# Patient Record
Sex: Female | Born: 1965 | Race: Black or African American | Hispanic: No | Marital: Single | Smoking: Never smoker
Health system: Southern US, Community
[De-identification: ages and names within clinical notes are randomized; demographics above are authoritative.]

## PROBLEM LIST (undated history)

## (undated) DIAGNOSIS — I10 Essential (primary) hypertension: Secondary | ICD-10-CM

## (undated) DIAGNOSIS — R87629 Unspecified abnormal cytological findings in specimens from vagina: Secondary | ICD-10-CM

## (undated) DIAGNOSIS — Z5189 Encounter for other specified aftercare: Secondary | ICD-10-CM

## (undated) DIAGNOSIS — R87612 Low grade squamous intraepithelial lesion on cytologic smear of cervix (LGSIL): Secondary | ICD-10-CM

## (undated) DIAGNOSIS — M2550 Pain in unspecified joint: Secondary | ICD-10-CM

## (undated) DIAGNOSIS — M1990 Unspecified osteoarthritis, unspecified site: Secondary | ICD-10-CM

## (undated) DIAGNOSIS — D649 Anemia, unspecified: Secondary | ICD-10-CM

## (undated) DIAGNOSIS — D219 Benign neoplasm of connective and other soft tissue, unspecified: Secondary | ICD-10-CM

## (undated) DIAGNOSIS — T7840XA Allergy, unspecified, initial encounter: Secondary | ICD-10-CM

## (undated) DIAGNOSIS — K219 Gastro-esophageal reflux disease without esophagitis: Secondary | ICD-10-CM

## (undated) DIAGNOSIS — K279 Peptic ulcer, site unspecified, unspecified as acute or chronic, without hemorrhage or perforation: Secondary | ICD-10-CM

## (undated) DIAGNOSIS — M549 Dorsalgia, unspecified: Secondary | ICD-10-CM

## (undated) HISTORY — DX: Encounter for other specified aftercare: Z51.89

## (undated) HISTORY — DX: Allergy, unspecified, initial encounter: T78.40XA

## (undated) HISTORY — DX: Pain in unspecified joint: M25.50

## (undated) HISTORY — DX: Essential (primary) hypertension: I10

## (undated) HISTORY — DX: Anemia, unspecified: D64.9

## (undated) HISTORY — DX: Peptic ulcer, site unspecified, unspecified as acute or chronic, without hemorrhage or perforation: K27.9

## (undated) HISTORY — DX: Low grade squamous intraepithelial lesion on cytologic smear of cervix (LGSIL): R87.612

## (undated) HISTORY — DX: Dorsalgia, unspecified: M54.9

## (undated) HISTORY — DX: Unspecified osteoarthritis, unspecified site: M19.90

## (undated) HISTORY — DX: Unspecified abnormal cytological findings in specimens from vagina: R87.629

## (undated) HISTORY — DX: Benign neoplasm of connective and other soft tissue, unspecified: D21.9

## (undated) HISTORY — PX: DILATION AND CURETTAGE OF UTERUS: SHX78

---

## 2013-12-12 ENCOUNTER — Encounter (HOSPITAL_COMMUNITY): Payer: Self-pay | Admitting: Emergency Medicine

## 2013-12-12 ENCOUNTER — Emergency Department (HOSPITAL_COMMUNITY): Payer: Worker's Compensation

## 2013-12-12 ENCOUNTER — Emergency Department (HOSPITAL_COMMUNITY)
Admission: EM | Admit: 2013-12-12 | Discharge: 2013-12-12 | Disposition: A | Discharge status: Home / Self Care | Payer: Worker's Compensation | Attending: Emergency Medicine | Admitting: Emergency Medicine

## 2013-12-12 DIAGNOSIS — S99919A Unspecified injury of unspecified ankle, initial encounter: Principal | ICD-10-CM

## 2013-12-12 DIAGNOSIS — S8990XA Unspecified injury of unspecified lower leg, initial encounter: Secondary | ICD-10-CM | POA: Diagnosis present

## 2013-12-12 DIAGNOSIS — Y9389 Activity, other specified: Secondary | ICD-10-CM | POA: Diagnosis not present

## 2013-12-12 DIAGNOSIS — S99929A Unspecified injury of unspecified foot, initial encounter: Principal | ICD-10-CM

## 2013-12-12 DIAGNOSIS — Y92410 Unspecified street and highway as the place of occurrence of the external cause: Secondary | ICD-10-CM | POA: Insufficient documentation

## 2013-12-12 MED ORDER — OXYCODONE-ACETAMINOPHEN 5-325 MG PO TABS
2.0000 | ORAL_TABLET | Freq: Once | ORAL | Status: AC
  Administered 2013-12-12: 2 via ORAL
  Filled 2013-12-12: qty 2

## 2013-12-12 MED ORDER — OXYCODONE-ACETAMINOPHEN 5-325 MG PO TABS: 1.0000 | ORAL_TABLET | ORAL | Status: DC | PRN
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
## 2013-12-12 NOTE — Progress Notes (Signed)
Orthopedic Tech Progress Note Patient Details:  Anita Holland [birth year] [id number]  Ortho Devices Type of Ortho Device: ASO;Knee Immobilizer Ortho Device/Splint Location: applied a knee immobilizer and lace up ankle brace to the right lower extremity. the patient is instructed on the wear and care of the braces. she will contact the nursing staff with any further questions or concerns. Ortho Device/Splint Interventions: Application   Ashok Cordia 12/12/2013, 2:43 AM

## 2013-12-12 NOTE — Discharge Instructions (Signed)
Take the prescribed medication as directed. Continue wearing brace and using crutches as needed. Ice and elevate foot at home to help with pain and swelling. Follow-up with Dr. Sharol Given if no improvement of symptoms within 1 week or if symptoms worsen. Return to the ED for new or worsening symptoms.

## 2013-12-12 NOTE — ED Provider Notes (Signed)
Medical screening examination/treatment/procedure(s) were performed by non-physician practitioner and as supervising physician I was immediately available for consultation/collaboration.   EKG Interpretation None        Wandra Arthurs, MD 12/12/13 [phone]
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
## 2014-05-11 ENCOUNTER — Emergency Department (HOSPITAL_COMMUNITY)
Admission: EM | Admit: 2014-05-11 | Discharge: 2014-05-11 | Disposition: A | Discharge status: Home / Self Care | Payer: No Typology Code available for payment source | Attending: Emergency Medicine | Admitting: Emergency Medicine

## 2014-05-11 ENCOUNTER — Encounter (HOSPITAL_COMMUNITY): Payer: Self-pay | Admitting: *Deleted

## 2014-05-11 DIAGNOSIS — K088 Other specified disorders of teeth and supporting structures: Secondary | ICD-10-CM | POA: Insufficient documentation

## 2014-05-11 DIAGNOSIS — K0889 Other specified disorders of teeth and supporting structures: Secondary | ICD-10-CM

## 2014-05-11 DIAGNOSIS — R05 Cough: Secondary | ICD-10-CM | POA: Insufficient documentation

## 2014-05-11 DIAGNOSIS — K007 Teething syndrome: Secondary | ICD-10-CM | POA: Insufficient documentation

## 2014-05-11 DIAGNOSIS — Z791 Long term (current) use of non-steroidal anti-inflammatories (NSAID): Secondary | ICD-10-CM | POA: Insufficient documentation

## 2014-05-11 DIAGNOSIS — G479 Sleep disorder, unspecified: Secondary | ICD-10-CM | POA: Insufficient documentation

## 2014-05-11 DIAGNOSIS — K219 Gastro-esophageal reflux disease without esophagitis: Secondary | ICD-10-CM | POA: Insufficient documentation

## 2014-05-11 HISTORY — DX: Gastro-esophageal reflux disease without esophagitis: K21.9

## 2014-05-11 MED ORDER — PENICILLIN V POTASSIUM 250 MG PO TABS: 250.0000 mg | ORAL_TABLET | Freq: Four times a day (QID) | ORAL | Status: AC

## 2014-05-11 MED ORDER — SUCRALFATE 1 GM/10ML PO SUSP: 1.0000 g | Freq: Three times a day (TID) | ORAL | Status: DC

## 2014-05-11 MED ORDER — OXYCODONE-ACETAMINOPHEN 5-325 MG PO TABS: 1.0000 | ORAL_TABLET | ORAL | Status: DC | PRN

## 2014-05-11 NOTE — ED Provider Notes (Signed)
CSN: [id number]     Arrival date & time 05/11/14  1311 History   First MD Initiated Contact with Patient 05/11/14 1630     Chief Complaint  Patient presents with  . Dental Pain  . Emesis     (Consider location/radiation/quality/duration/timing/severity/associated sxs/prior Treatment) HPI Comments: Patient with PMH of GERD presents to the ED with a chief complaint of multiple complaints.  1. Dental pain: she states that she has had dental pain for the past week or so. She denies a new injury to her tooth. She states that she has had dental infections in the past. She denies any fevers, or chills. She has tried using nothing for her symptoms. She states the pain is moderate to severe. She does not have a dentist.  2. GERD: Patient states that she has a history of GERD. She states that she has had some nausea, and intermittent vomiting. She states that she feels like acid is bubbling in her stomach. She has felt these symptoms before. She denies abdominal pain. She has not taken anything to alleviate the symptoms.  3. Cough: Patient states that she has had a cough 1 month. She denies any fevers or chills. She has tried OTC cough and cold medicine. Aggravating or alleviating factors. Denies any shortness of breath or chest pain. Denies any productive sputum.  The history is provided by the patient. No language interpreter was used.    Past Medical History  Diagnosis Date  . GERD (gastroesophageal reflux disease)    History reviewed. No pertinent past surgical history. History reviewed. No pertinent family history. History  Substance Use Topics  . Smoking status: Never Smoker   . Smokeless tobacco: Not on file  . Alcohol Use: No   OB History    No data available     Review of Systems  Constitutional: Negative for fever and chills.  HENT: Positive for dental problem. Negative for drooling.   Respiratory: Positive for cough. Negative for shortness of breath.   Cardiovascular:  Negative for chest pain.  Gastrointestinal: Positive for nausea. Negative for vomiting, diarrhea and constipation.  Genitourinary: Negative for dysuria.  Neurological: Negative for speech difficulty.  Psychiatric/Behavioral: Positive for sleep disturbance.  All other systems reviewed and are negative.     Allergies  Review of patient's allergies indicates no known allergies.  Home Medications   Prior to Admission medications   Medication Sig Start Date End Date Taking? Authorizing Provider  diclofenac (VOLTAREN) 75 MG EC tablet Take 75 mg by mouth 2 (two) times daily. 05/05/14  Yes Historical Provider, MD  ibuprofen (ADVIL,MOTRIN) 200 MG tablet Take 200 mg by mouth every 6 (six) hours as needed for mild pain (toothache).   Yes Historical Provider, MD  oxyCODONE-acetaminophen (PERCOCET/ROXICET) 5-325 MG per tablet Take 1 tablet by mouth every 4 (four) hours as needed. 05/11/14   Montine Circle, PA-C  penicillin v potassium (VEETID) 250 MG tablet Take 1 tablet (250 mg total) by mouth 4 (four) times daily. 05/11/14 05/18/14  Montine Circle, PA-C  sucralfate (CARAFATE) 1 GM/10ML suspension Take 10 mLs (1 g total) by mouth 4 (four) times daily -  with meals and at bedtime. 05/11/14   Montine Circle, PA-C   BP 114/95 mmHg  Pulse 98  Temp(Src) 98.5 F (36.9 C) (Oral)  Resp 20  Ht 5\' 3"  (1.6 m)  Wt 167 lb (75.751 kg)  BMI 29.59 kg/m2  SpO2 100%  LMP 04/07/2014 Physical Exam  Constitutional: She is oriented to person, place, and  time. She appears well-developed and well-nourished.  HENT:  Head: Normocephalic and atraumatic.  Mouth/Throat:    Poor dentition throughout.  Affected tooth as diagrammed.  No signs of peritonsillar or tonsillar abscess.  No signs of gingival abscess. Oropharynx is clear and without exudates.  Uvula is midline.  Airway is intact. No signs of Ludwig's angina with palpation of oral and sublingual mucosa.   Eyes: Conjunctivae and EOM are normal. Pupils are  equal, round, and reactive to light.  Neck: Normal range of motion. Neck supple.  Cardiovascular: Normal rate and regular rhythm.  Exam reveals no gallop and no friction rub.   No murmur heard. Pulmonary/Chest: Effort normal and breath sounds normal. No respiratory distress. She has no wheezes. She has no rales. She exhibits no tenderness.  Clear to auscultation bilaterally, no wheezes or rales  Abdominal: Soft. Bowel sounds are normal. She exhibits no distension and no mass. There is no tenderness. There is no rebound and no guarding.  No focal abdominal tenderness, no RLQ tenderness or pain at McBurney's point, no RUQ tenderness or Murphy's sign, no left-sided abdominal tenderness, no fluid wave, or signs of peritonitis   Musculoskeletal: Normal range of motion. She exhibits no edema or tenderness.  Neurological: She is alert and oriented to person, place, and time.  Skin: Skin is warm and dry.  Psychiatric: She has a normal mood and affect. Her behavior is normal. Judgment and thought content normal.  Nursing note and vitals reviewed.   ED Course  Procedures (including critical care time) Labs Review Labs Reviewed - No data to display  Imaging Review No results found.   EKG Interpretation None      MDM   Final diagnoses:  Pain, dental    Patient with dental pain, GERD, and cough. Will treat dental pain with antibiotics and pain medicine. No abscess. No airway obstruction. Will recommend OTC cough and cold medicine for the cough. Will treat his GERD with Carafate. Recommend primary care follow-up for cough and GERD. Lungs are clear, patient is afebrile, no productive cough. Doubt pneumonia. No shortness of breath or chest pain.    Montine Circle, PA-C 05/11/14 1709  Mariea Clonts, MD 05/12/14 [phone]

## 2014-05-11 NOTE — ED Notes (Signed)
PA at the bedside.

## 2014-05-11 NOTE — ED Notes (Signed)
Pt in c/o toothache and vomiting since Wednesday, states she has had trouble with the tooth in the past, and symptoms are getting worse

## 2014-05-11 NOTE — Discharge Instructions (Signed)
Dental Pain A tooth ache may be caused by cavities (tooth decay). Cavities expose the nerve of the tooth to air and hot or cold temperatures. It may come from an infection or abscess (also called a boil or furuncle) around your tooth. It is also often caused by dental caries (tooth decay). This causes the pain you are having. DIAGNOSIS  Your caregiver can diagnose this problem by exam. TREATMENT   If caused by an infection, it may be treated with medications which kill germs (antibiotics) and pain medications as prescribed by your caregiver. Take medications as directed.  Only take over-the-counter or prescription medicines for pain, discomfort, or fever as directed by your caregiver.  Whether the tooth ache today is caused by infection or dental disease, you should see your dentist as soon as possible for further care. SEEK MEDICAL CARE IF: The exam and treatment you received today has been provided on an emergency basis only. This is not a substitute for complete medical or dental care. If your problem worsens or new problems (symptoms) appear, and you are unable to meet with your dentist, call or return to this location. SEEK IMMEDIATE MEDICAL CARE IF:   You have a fever.  You develop redness and swelling of your face, jaw, or neck.  You are unable to open your mouth.  You have severe pain uncontrolled by pain medicine. MAKE SURE YOU:   Understand these instructions.  Will watch your condition.  Will get help right away if you are not doing well or get worse. Document Released: 06/12/2005 Document Revised: 09/04/2011 Document Reviewed: 01/29/2008 [facility] Patient Information 2015 [place], [place]. This information is not intended to replace advice given to you by your health care provider. Make sure you discuss any questions you have with your health care provider. Gastroesophageal Reflux Disease, Adult Gastroesophageal reflux disease (GERD) happens when acid from your stomach flows  up into the esophagus. When acid comes in contact with the esophagus, the acid causes soreness (inflammation) in the esophagus. Over time, GERD may create small holes (ulcers) in the lining of the esophagus. CAUSES   Increased body weight. This puts pressure on the stomach, making acid rise from the stomach into the esophagus.  Smoking. This increases acid production in the stomach.  Drinking alcohol. This causes decreased pressure in the lower esophageal sphincter (valve or ring of muscle between the esophagus and stomach), allowing acid from the stomach into the esophagus.  Late evening meals and a full stomach. This increases pressure and acid production in the stomach.  A malformed lower esophageal sphincter. Sometimes, no cause is found. SYMPTOMS   Burning pain in the lower part of the mid-chest behind the breastbone and in the mid-stomach area. This may occur twice a week or more often.  Trouble swallowing.  Sore throat.  Dry cough.  Asthma-like symptoms including chest tightness, shortness of breath, or wheezing. DIAGNOSIS  Your caregiver may be able to diagnose GERD based on your symptoms. In some cases, X-rays and other tests may be done to check for complications or to check the condition of your stomach and esophagus. TREATMENT  Your caregiver may recommend over-the-counter or prescription medicines to help decrease acid production. Ask your caregiver before starting or adding any new medicines.  HOME CARE INSTRUCTIONS   Change the factors that you can control. Ask your caregiver for guidance concerning weight loss, quitting smoking, and alcohol consumption.  Avoid foods and drinks that make your symptoms worse, such as:  Caffeine or alcoholic  drinks.  Chocolate.  Peppermint or mint flavorings.  Garlic and onions.  Spicy foods.  Citrus fruits, such as oranges, lemons, or limes.  Tomato-based foods such as sauce, chili, salsa, and pizza.  Fried and fatty  foods.  Avoid lying down for the 3 hours prior to your bedtime or prior to taking a nap.  Eat small, frequent meals instead of large meals.  Wear loose-fitting clothing. Do not wear anything tight around your waist that causes pressure on your stomach.  Raise the head of your bed 6 to 8 inches with wood blocks to help you sleep. Extra pillows will not help.  Only take over-the-counter or prescription medicines for pain, discomfort, or fever as directed by your caregiver.  Do not take aspirin, ibuprofen, or other nonsteroidal anti-inflammatory drugs (NSAIDs). SEEK IMMEDIATE MEDICAL CARE IF:   You have pain in your arms, neck, jaw, teeth, or back.  Your pain increases or changes in intensity or duration.  You develop nausea, vomiting, or sweating (diaphoresis).  You develop shortness of breath, or you faint.  Your vomit is green, yellow, black, or looks like coffee grounds or blood.  Your stool is red, bloody, or black. These symptoms could be signs of other problems, such as heart disease, gastric bleeding, or esophageal bleeding. MAKE SURE YOU:   Understand these instructions.  Will watch your condition.  Will get help right away if you are not doing well or get worse. Document Released: 03/22/2005 Document Revised: 09/04/2011 Document Reviewed: 12/30/2010 [facility] Patient Information 2015 [place], [place]. This information is not intended to replace advice given to you by your health care provider. Make sure you discuss any questions you have with your health care provider.

## 2016-03-27 ENCOUNTER — Encounter: Payer: Self-pay | Admitting: Family Medicine

## 2016-03-27 ENCOUNTER — Ambulatory Visit (INDEPENDENT_AMBULATORY_CARE_PROVIDER_SITE_OTHER): Payer: BLUE CROSS/BLUE SHIELD | Admitting: Family Medicine

## 2016-03-27 VITALS — BP 124/70 | HR 80 | Ht 62.75 in | Wt 175.8 lb

## 2016-03-27 DIAGNOSIS — Z7689 Persons encountering health services in other specified circumstances: Secondary | ICD-10-CM

## 2016-03-27 DIAGNOSIS — R5383 Other fatigue: Secondary | ICD-10-CM

## 2016-03-27 DIAGNOSIS — Z309 Encounter for contraceptive management, unspecified: Secondary | ICD-10-CM

## 2016-03-27 DIAGNOSIS — R079 Chest pain, unspecified: Secondary | ICD-10-CM

## 2016-03-27 DIAGNOSIS — K219 Gastro-esophageal reflux disease without esophagitis: Secondary | ICD-10-CM

## 2016-03-27 DIAGNOSIS — E669 Obesity, unspecified: Secondary | ICD-10-CM | POA: Diagnosis not present

## 2016-03-27 LAB — COMPLETE METABOLIC PANEL WITH GFR
ALBUMIN: 4.1 g/dL (ref 3.6–5.1)
ALK PHOS: 36 U/L (ref 33–115)
ALT: 12 U/L (ref 6–29)
AST: 15 U/L (ref 10–35)
BUN: 13 mg/dL (ref 7–25)
CO2: 25 mmol/L (ref 20–31)
Calcium: 9.4 mg/dL (ref 8.6–10.2)
Chloride: 103 mmol/L (ref 98–110)
Creat: 0.59 mg/dL (ref 0.50–1.10)
GFR, Est African American: >89 mL/min (ref >=60)
GLUCOSE: 87 mg/dL (ref 65–99)
Potassium: 4 mmol/L (ref 3.5–5.3)
SODIUM: 140 mmol/L (ref 135–146)
Total Bilirubin: 0.4 mg/dL (ref 0.2–1.2)
Total Protein: 6.4 g/dL (ref 6.1–8.1)

## 2016-03-27 LAB — CBC WITH DIFFERENTIAL/PLATELET
BASOS ABS: 64 {cells}/uL (ref 0–200)
Basophils Relative: 1 %
EOS ABS: 448 {cells}/uL (ref 15–500)
EOS PCT: 7 %
HCT: 40.5 % (ref 35.0–45.0)
HEMOGLOBIN: 13.1 g/dL (ref 11.7–15.5)
LYMPHS ABS: 1984 {cells}/uL (ref 850–3900)
Lymphocytes Relative: 31 %
MCH: 26.6 pg — AB (ref 27.0–33.0)
MCHC: 32.3 g/dL (ref 32.0–36.0)
MCV: 82.2 fL (ref 80.0–100.0)
MONO ABS: 576 {cells}/uL (ref 200–950)
MPV: 8.3 fL (ref 7.5–12.5)
Monocytes Relative: 9 %
NEUTROS ABS: 3328 {cells}/uL (ref 1500–7800)
Neutrophils Relative %: 52 %
Platelets: 316 10*3/uL (ref 140–400)
RBC: 4.93 MIL/uL (ref 3.80–5.10)
RDW: 16.5 % — ABNORMAL HIGH (ref 11.0–15.0)
WBC: 6.4 10*3/uL (ref 4.0–10.5)

## 2016-03-27 LAB — TSH: TSH: 1.2 mIU/L
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
## 2016-03-27 NOTE — Patient Instructions (Signed)
I would like to get your medical records from your previous providers. Try using a free app called My [employer]itness Pal and track your calories.  We will call you with lab results.  I recommend that you return in the next few weeks for a physical and fasting labs.  We will further discuss contraception at that time.

## 2016-04-04 ENCOUNTER — Telehealth: Payer: Self-pay

## 2016-04-04 NOTE — Telephone Encounter (Signed)
Records placed in your folder for review. /RLB  

## 2016-05-01 ENCOUNTER — Encounter: Payer: Self-pay | Admitting: Family Medicine

## 2016-05-01 ENCOUNTER — Ambulatory Visit (INDEPENDENT_AMBULATORY_CARE_PROVIDER_SITE_OTHER): Payer: BLUE CROSS/BLUE SHIELD | Admitting: Family Medicine

## 2016-05-01 VITALS — BP 110/70 | HR 79 | Ht 62.75 in | Wt 176.8 lb

## 2016-05-01 DIAGNOSIS — E781 Pure hyperglyceridemia: Secondary | ICD-10-CM | POA: Diagnosis not present

## 2016-05-01 DIAGNOSIS — Z1239 Encounter for other screening for malignant neoplasm of breast: Secondary | ICD-10-CM

## 2016-05-01 DIAGNOSIS — Z0000 Encounter for general adult medical examination without abnormal findings: Secondary | ICD-10-CM

## 2016-05-01 DIAGNOSIS — Z1231 Encounter for screening mammogram for malignant neoplasm of breast: Secondary | ICD-10-CM

## 2016-05-01 DIAGNOSIS — Z1211 Encounter for screening for malignant neoplasm of colon: Secondary | ICD-10-CM

## 2016-05-01 LAB — POCT URINALYSIS DIPSTICK
Bilirubin, UA: NEGATIVE
Glucose, UA: NEGATIVE
Ketones, UA: NEGATIVE
Nitrite, UA: POSITIVE
PH UA: 6
Urobilinogen, UA: NEGATIVE

## 2016-05-01 NOTE — Progress Notes (Signed)
Subjective:    Patient ID: Anita Holland, female    DOB: [birth year], 50 y.o.   MRN: [id number]  HPI Chief Complaint  Patient presents with  . cpe    fasting cpe, no other concerns, declines flu shot and tdap   She is new to the practice and here for a CPE.  Previous medical care :  [facility].  Last CPE: [facility] in [place].   Other providers: none  PMH: history of ? Stomach ulcers, GERD- takes Tums occasionally. Ranitidine in past.   Surgeries: none  Mammogram: scheduled for October 31 in [place]. This was scheduled through her insurance. She would like to cancel and have this done in [place].   LMP: today. Reports she still has regular menstrual cycles.  States she has a new sexual partner and is using condoms but would also like to be put on OCPs. Denies history of smoking, migraines, HTN, blood clots.   Does not currently know how many calories she is eating but states she is eating more than 3 meals per day.  States she is not exercising.  Denies smoking, drinking alcohol, drug use.   Lives alone. Works as a Animal nutritionist.  Has 8 children.   Immunizations: refuses Tdap and flu shot.   Health maintenance:  Mammogram: 2014 Colonoscopy: never Last Gynecological Exam:  Pap smear 2015 and normal.  Last Menstrual cycle: started today.  Last Dental Exam: last month. Twice annually.  Last Eye Exam: last Monday. Getting prescription glasses.   Wears seatbelt always, uses sunscreen, smoke detectors in home and functioning, does not text while driving and feels safe in home environment.   Reviewed allergies, medications, past medical, surgical, family, and social history.     Review of Systems Review of Systems Constitutional: -fever, -chills, -sweats, -unexpected weight change,-fatigue ENT: -runny nose, -ear pain, -sore throat Cardiology:  -chest pain, -palpitations, -edema Respiratory: -cough, -shortness of breath, -wheezing Gastroenterology:  -abdominal pain, -nausea, -vomiting, -diarrhea, -constipation  Hematology: -bleeding or bruising problems Musculoskeletal: -arthralgias, -myalgias, -joint swelling, -back pain Ophthalmology: -vision changes Urology: -dysuria, -difficulty urinating, -hematuria, -urinary frequency, -urgency Neurology: -headache, -weakness, -tingling, -numbness       Objective:   Physical Exam BP 110/70   Pulse 79   Ht 5' 2.75" (1.594 m)   Wt 176 lb 12.8 oz (80.2 kg)   LMP 05/01/2016   BMI 31.57 kg/m   General Appearance:    Alert, cooperative, no distress, appears stated age  Head:    Normocephalic, without obvious abnormality, atraumatic  Eyes:    PERRL, conjunctiva/corneas clear, EOM's intact, fundi    benign  Ears:    Normal TM's and external ear canals  Nose:   Nares normal, mucosa normal, no drainage or sinus   tenderness  Throat:   Lips, mucosa, and tongue normal; teeth and gums normal  Neck:   Supple, no lymphadenopathy;  thyroid:  no   enlargement/tenderness/nodules; no carotid   bruit or JVD  Back:    Spine nontender, no curvature, ROM normal, no CVA     tenderness  Lungs:     Clear to auscultation bilaterally without wheezes, rales or     ronchi; respirations unlabored  Chest Wall:    No tenderness or deformity   Heart:    Regular rate and rhythm, S1 and S2 normal, no murmur, rub   or gallop  Breast Exam:    Sending her for mammogram. Declined exam.   No axillary lymphadenopathy  Abdomen:  Soft, non-tender, nondistended, normoactive bowel sounds,    no masses, no hepatosplenomegaly  Genitalia:    Not done, patient on menstrual cycle and declined. Pap smear up to date.   Rectal:    Refused. Stool cards sent home.   Extremities:   No clubbing, cyanosis or edema  Pulses:   2+ and symmetric all extremities  Skin:   Skin color, texture, turgor normal, no rashes or lesions  Lymph nodes:   Cervical, supraclavicular, and axillary nodes normal  Neurologic:   CNII-XII intact, normal  strength, sensation and gait; reflexes 2+ and symmetric throughout          Psych:   Normal mood, affect, hygiene and grooming.    Urinalysis dipstick: leuk 1+, trace nit, trac pro, blood 3+ (menses). Asymptomatic.       Assessment & Plan:  Routine general medical examination at a health care facility - Plan: Urinalysis Dipstick, POCT occult blood stool  Screening for breast cancer - Plan: MM DIGITAL SCREENING BILATERAL  Special screening for malignant neoplasms, colon - Plan: POCT occult blood stool  Hypertriglyceridemia - Plan: Lipid panel  Discussed that overall she appears to be doing well. Records received from previous PCP in [place] and will be scanned into the computer. Advised lifestyle modifications for reflux management. Discussed healthy diet and exercise for weight loss. Mammogram ordered. She questions whether she should have a colonoscopy this year. She denies family history of polyps or colon cancer. Discussed that she appears to be low risk for colon cancer. Will send her home with stool cards to return at her convenience. History of hypertriglyceridemia. She is not fasting today. Order is in the computer and she will return for this. Otherwise recent blood work done and all normal. Flu shot refused Discussed safety and health promotion.  Pap smear will be due next year. She will call and check with her insurance company regarding co-pay for Tdap. She will only agree to this if it is covered. Follow-up pending labs or in 1 year.

## 2016-05-01 NOTE — Patient Instructions (Addendum)
Call and schedule your mammogram appointment. Return in the stool cards at your convenience. Call and check with your insurance company to see if the Tdap is covered. If it is, return for a lab visit to get this.   Return for a lab visit to have your fasting cholesterol checked.   Preventative Care for Adults - Female      MAINTAIN REGULAR HEALTH EXAMS:  A routine yearly physical is a good way to check in with your primary care provider about your health and preventive screening. It is also an opportunity to share updates about your health and any concerns you have, and receive a thorough all-over exam.   Most health insurance companies pay for at least some preventative services.  Check with your health plan for specific coverages.  WHAT PREVENTATIVE SERVICES DO WOMEN NEED?  Adult women should have their weight and blood pressure checked regularly.   Women age 35 and older should have their cholesterol levels checked regularly.  Women should be screened for cervical cancer with a Pap smear and pelvic exam beginning at either age 32, or 3 years after they become sexually activity.    Breast cancer screening generally begins at age 64 with a mammogram and breast exam by your primary care provider.    Beginning at age 39 and continuing to age 38, women should be screened for colorectal cancer.  Certain people may need continued testing until age 36.  Updating vaccinations is part of preventative care.  Vaccinations help protect against diseases such as the flu.  Osteoporosis is a disease in which the bones lose minerals and strength as we age. Women ages 57 and over should discuss this with their caregivers, as should women after menopause who have other risk factors.  Lab tests are generally done as part of preventative care to screen for anemia and blood disorders, to screen for problems with the kidneys and liver, to screen for bladder problems, to check blood sugar, and to check your  cholesterol level.  Preventative services generally include counseling about diet, exercise, avoiding tobacco, drugs, excessive alcohol consumption, and sexually transmitted infections.    GENERAL RECOMMENDATIONS FOR GOOD HEALTH:  Healthy diet:  Eat a variety of foods, including fruit, vegetables, animal or vegetable protein, such as meat, fish, chicken, and eggs, or beans, lentils, tofu, and grains, such as rice.  Drink plenty of water daily.  Decrease saturated fat in the diet, avoid lots of red meat, processed foods, sweets, fast foods, and fried foods.  Exercise:  Aerobic exercise helps maintain good heart health. At least 30-40 minutes of moderate-intensity exercise is recommended. For example, a brisk walk that increases your heart rate and breathing. This should be done on most days of the week.   Find a type of exercise or a variety of exercises that you enjoy so that it becomes a part of your daily life.  Examples are running, walking, swimming, water aerobics, and biking.  For motivation and support, explore group exercise such as aerobic class, spin class, Zumba, Yoga,or  martial arts, etc.    Set exercise goals for yourself, such as a certain weight goal, walk or run in a race such as a 5k walk/run.  Speak to your primary care provider about exercise goals.  Disease prevention:  If you smoke or chew tobacco, find out from your caregiver how to quit. It can literally save your life, no matter how long you have been a tobacco user. If you do not  use tobacco, never begin.   Maintain a healthy diet and normal weight. Increased weight leads to problems with blood pressure and diabetes.   The Body Mass Index or BMI is a way of measuring how much of your body is fat. Having a BMI above 27 increases the risk of heart disease, diabetes, hypertension, stroke and other problems related to obesity. Your caregiver can help determine your BMI and based on it develop an exercise and dietary  program to help you achieve or maintain this important measurement at a healthful level.  High blood pressure causes heart and blood vessel problems.  Persistent high blood pressure should be treated with medicine if weight loss and exercise do not work.   Fat and cholesterol leaves deposits in your arteries that can block them. This causes heart disease and vessel disease elsewhere in your body.  If your cholesterol is found to be high, or if you have heart disease or certain other medical conditions, then you may need to have your cholesterol monitored frequently and be treated with medication.   Ask if you should have a cardiac stress test if your history suggests this. A stress test is a test done on a treadmill that looks for heart disease. This test can find disease prior to there being a problem.  Menopause can be associated with physical symptoms and risks. Hormone replacement therapy is available to decrease these. You should talk to your caregiver about whether starting or continuing to take hormones is right for you.   Osteoporosis is a disease in which the bones lose minerals and strength as we age. This can result in serious bone fractures. Risk of osteoporosis can be identified using a bone density scan. Women ages 39 and over should discuss this with their caregivers, as should women after menopause who have other risk factors. Ask your caregiver whether you should be taking a calcium supplement and Vitamin D, to reduce the rate of osteoporosis.   Avoid drinking alcohol in excess (more than two drinks per day).  Avoid use of street drugs. Do not share needles with anyone. Ask for professional help if you need assistance or instructions on stopping the use of alcohol, cigarettes, and/or drugs.  Brush your teeth twice a day with fluoride toothpaste, and floss once a day. Good oral hygiene prevents tooth decay and gum disease. The problems can be painful, unattractive, and can cause other  health problems. Visit your dentist for a routine oral and dental check up and preventive care every 6-12 months.   Look at your skin regularly.  Use a mirror to look at your back. Notify your caregivers of changes in moles, especially if there are changes in shapes, colors, a size larger than a pencil eraser, an irregular border, or development of new moles.  Safety:  Use seatbelts 100% of the time, whether driving or as a passenger.  Use safety devices such as hearing protection if you work in environments with loud noise or significant background noise.  Use safety glasses when doing any work that could send debris in to the eyes.  Use a helmet if you ride a bike or motorcycle.  Use appropriate safety gear for contact sports.  Talk to your caregiver about gun safety.  Use sunscreen with a SPF (or skin protection factor) of 15 or greater.  Lighter skinned people are at a greater risk of skin cancer. Don't forget to also wear sunglasses in order to protect your eyes from too much damaging  sunlight. Damaging sunlight can accelerate cataract formation.   Practice safe sex. Use condoms. Condoms are used for birth control and to help reduce the spread of sexually transmitted infections (or STIs).  Some of the STIs are gonorrhea (the clap), chlamydia, syphilis, trichomonas, herpes, HPV (human papilloma virus) and HIV (human immunodeficiency virus) which causes AIDS. The herpes, HIV and HPV are viral illnesses that have no cure. These can result in disability, cancer and death.   Keep carbon monoxide and smoke detectors in your home functioning at all times. Change the batteries every 6 months or use a model that plugs into the wall.   Vaccinations:  Stay up to date with your tetanus shots and other required immunizations. You should have a booster for tetanus every 10 years. Be sure to get your flu shot every year, since 5%-20% of the [place] population comes down with the flu. The flu vaccine changes each  year, so being vaccinated once is not enough. Get your shot in the fall, before the flu season peaks.   Other vaccines to consider:  Human Papilloma Virus or HPV causes cancer of the cervix, and other infections that can be transmitted from person to person. There is a vaccine for HPV, and females should get immunized between the ages of 48 and 97. It requires a series of 3 shots.   Pneumococcal vaccine to protect against certain types of pneumonia.  This is normally recommended for adults age 12 or older.  However, adults younger than 49 years old with certain underlying conditions such as diabetes, heart or lung disease should also receive the vaccine.  Shingles vaccine to protect against Varicella Zoster if you are older than age 87, or younger than 49 years old with certain underlying illness.  Hepatitis A vaccine to protect against a form of infection of the liver by a virus acquired from food.  Hepatitis B vaccine to protect against a form of infection of the liver by a virus acquired from blood or body fluids, particularly if you work in health care.  If you plan to travel internationally, check with your local health department for specific vaccination recommendations.  Cancer Screening:  Breast cancer screening is essential to preventive care for women. All women age 37 and older should perform a breast self-exam every month. At age 59 and older, women should have their caregiver complete a breast exam each year. Women at ages 33 and older should have a mammogram (x-ray film) of the breasts. Your caregiver can discuss how often you need mammograms.    Cervical cancer screening includes taking a Pap smear (sample of cells examined under a microscope) from the cervix (end of the uterus). It also includes testing for HPV (Human Papilloma Virus, which can cause cervical cancer). Screening and a pelvic exam should begin at age 90, or 3 years after a woman becomes sexually active. Screening  should occur every year, with a Pap smear but no HPV testing, up to age 47. After age 20, you should have a Pap smear every 3 years with HPV testing, if no HPV was found previously.   Most routine colon cancer screening begins at the age of 25. On a yearly basis, doctors may provide special easy to use take-home tests to check for hidden blood in the stool. Sigmoidoscopy or colonoscopy can detect the earliest forms of colon cancer and is life saving. These tests use a small camera at the end of a tube to directly examine the colon. Speak  to your caregiver about this at age 10, when routine screening begins (and is repeated every 5 years unless early forms of pre-cancerous polyps or small growths are found).

## 2016-05-08 ENCOUNTER — Other Ambulatory Visit: Payer: BLUE CROSS/BLUE SHIELD

## 2016-05-08 DIAGNOSIS — E781 Pure hyperglyceridemia: Secondary | ICD-10-CM

## 2016-05-08 LAB — LIPID PANEL
CHOL/HDL RATIO: 3.7 ratio (ref <5.0)
CHOLESTEROL: 165 mg/dL (ref <200)
HDL: 45 mg/dL — ABNORMAL LOW (ref >50)
LDL Cholesterol: 99 mg/dL (ref <100)
Triglycerides: 103 mg/dL (ref <150)
VLDL: 21 mg/dL (ref <30)

## 2016-06-05 ENCOUNTER — Ambulatory Visit
Admission: RE | Admit: 2016-06-05 | Discharge: 2016-06-05 | Disposition: A | Discharge status: Home / Self Care | Payer: BLUE CROSS/BLUE SHIELD | Source: Ambulatory Visit | Attending: Family Medicine | Admitting: Family Medicine

## 2016-06-05 DIAGNOSIS — Z1239 Encounter for other screening for malignant neoplasm of breast: Secondary | ICD-10-CM

## 2017-02-21 ENCOUNTER — Ambulatory Visit
Admission: RE | Admit: 2017-02-21 | Discharge: 2017-02-21 | Disposition: A | Discharge status: Home / Self Care | Payer: BLUE CROSS/BLUE SHIELD | Source: Ambulatory Visit | Attending: Family Medicine | Admitting: Family Medicine

## 2017-02-21 ENCOUNTER — Ambulatory Visit (INDEPENDENT_AMBULATORY_CARE_PROVIDER_SITE_OTHER): Payer: BLUE CROSS/BLUE SHIELD | Admitting: Family Medicine

## 2017-02-21 ENCOUNTER — Encounter: Payer: Self-pay | Admitting: Family Medicine

## 2017-02-21 VITALS — BP 120/90 | HR 93 | Temp 98.2°F | Resp 16 | Wt 178.8 lb

## 2017-02-21 DIAGNOSIS — R29898 Other symptoms and signs involving the musculoskeletal system: Secondary | ICD-10-CM

## 2017-02-21 DIAGNOSIS — R200 Anesthesia of skin: Secondary | ICD-10-CM | POA: Diagnosis not present

## 2017-02-21 DIAGNOSIS — R079 Chest pain, unspecified: Secondary | ICD-10-CM

## 2017-02-21 DIAGNOSIS — Z8249 Family history of ischemic heart disease and other diseases of the circulatory system: Secondary | ICD-10-CM

## 2017-02-21 DIAGNOSIS — M542 Cervicalgia: Secondary | ICD-10-CM

## 2017-02-21 LAB — CBC WITH DIFFERENTIAL/PLATELET
BASOS ABS: 52 {cells}/uL (ref 0–200)
Basophils Relative: 1 %
Eosinophils Absolute: 260 cells/uL (ref 15–500)
Eosinophils Relative: 5 %
HCT: 41.3 % (ref 35.0–45.0)
HEMOGLOBIN: 13.1 g/dL (ref 11.7–15.5)
Lymphocytes Relative: 33 %
Lymphs Abs: 1716 cells/uL (ref 850–3900)
MCH: 26.7 pg — ABNORMAL LOW (ref 27.0–33.0)
MCHC: 31.7 g/dL — ABNORMAL LOW (ref 32.0–36.0)
MCV: 84.1 fL (ref 80.0–100.0)
MONOS PCT: 7 %
MPV: 8 fL (ref 7.5–12.5)
Monocytes Absolute: 364 cells/uL (ref 200–950)
Neutro Abs: 2808 cells/uL (ref 1500–7800)
Neutrophils Relative %: 54 %
Platelets: 349 10*3/uL (ref 140–400)
RBC: 4.91 MIL/uL (ref 3.80–5.10)
RDW: 15.7 % — ABNORMAL HIGH (ref 11.0–15.0)
WBC: 5.2 10*3/uL (ref 4.0–10.5)

## 2017-02-21 NOTE — Patient Instructions (Addendum)
We will call you with lab and XR results.   Start the [place] for acid reflux, one pill once daily 30 minutes before breakfast.   Follow up with me in 7-10 days.   If your chest pain returns or if you develop any new symptoms or worsening weakness or numbness then go straight to the emergency department or call 911. Your EKG is normal today.

## 2017-02-21 NOTE — Progress Notes (Signed)
Subjective:    Patient ID: Anita Holland, female    DOB: Nov 19, 1965, 51 y.o.   MRN: [id number]  HPI Chief Complaint  Patient presents with  . pain in arm    numbness down arm and achy going up neck and going down other arm. started Sunday night.  tynelol for pain. no SOB. hearburn started 2 weeks ago   She is here with multiple complaints.  Complains of a history of intermittent numbness and pain that starts at her neck and radiates down to her fingers. States all of her fingers are involved. States pain is worse at night. She has also noticed some left arm weakness.  Occasionally has pins and needles sensation in her left hand. States pain is worse in her neck and also starting to have pain in her right arm. States she has used a topical analgesic helped with her pain temporarily. Has tried ice, heat, different positions at night.  Denies neck injuries or surgery.   Reports having mid sternal chest heaviness 3 days ago that lasted 2 1/2 hours. States she also had similar symptoms 2 days ago that lasted approximately one hour and resolved spontaneously. Denies having any associated symptoms such as palpitations, shortness of breath, diaphoresis. No leg pain.  Denies recent surgery, long travel or immobilization. No history of DVT.   Denies leg weakness.  Denies falling.   Also reports having reflux symptoms with a history of GERD. She has not been taking medication for this.   Denies fever, chills, headache, vision changes, fatigue, dizziness, abdominal pain, N/V/D, urinary symptoms, LE edema.   Reports sister had a heart attack in her early 74s and a stroke a few weeks ago.   Reviewed allergies, medications, past medical, surgical, family, and social history.   Review of Systems Pertinent positives and negatives in the history of present illness.     Objective:   Physical Exam  Constitutional: She is oriented to person, place, and time. She appears well-developed and  well-nourished. No distress.  HENT:  Mouth/Throat: Uvula is midline, oropharynx is clear and moist and mucous membranes are normal.  Eyes: Pupils are equal, round, and reactive to light. Conjunctivae, EOM and lids are normal.  Neck: Neck supple. No JVD present. Spinous process tenderness and muscular tenderness present. No thyromegaly present.  Full ROM with pain. No step off.   Cardiovascular: Normal rate, regular rhythm and normal pulses.  Exam reveals no gallop and no friction rub.   No LE edema  Pulmonary/Chest: Effort normal and breath sounds normal. She exhibits no tenderness.  Musculoskeletal:       Left shoulder: She exhibits decreased range of motion and pain.  Lymphadenopathy:    She has no cervical adenopathy.  Neurological: She is alert and oriented to person, place, and time. She has normal reflexes. No cranial nerve deficit or sensory deficit. She displays a negative Romberg sign. Coordination and gait normal.  Normal finger to nose, heel to shin Mild weakness to her LUE  Skin: Skin is warm and dry. No rash noted. She is not diaphoretic. No pallor.  Psychiatric: She has a normal mood and affect. Her speech is normal and behavior is normal. Thought content normal. Cognition and memory are normal.   BP 120/90   Pulse 93   Temp 98.2 F (36.8 C) (Oral)   Resp 16   Wt 178 lb 12.8 oz (81.1 kg)   SpO2 97%   BMI 31.93 kg/m       Assessment &  Plan:  Bilateral posterior neck pain - Plan: DG Cervical Spine Complete  Left arm numbness - Plan: CBC with Differential/Platelet, Comprehensive metabolic panel, TSH, EKG 41-DQQI, DG Cervical Spine Complete  Left arm weakness - Plan: CBC with Differential/Platelet, Comprehensive metabolic panel, TSH, EKG 29-NLGX, DG Cervical Spine Complete  Intermittent chest pain - Plan: CBC with Differential/Platelet, Comprehensive metabolic panel, TSH, EKG 21-JHER, DG Chest 2 View  Family history of heart disease in female family member before age  21 - Plan: DG Chest 2 View  ECG is unremarkable. NSR, normal rate.  Chest pain being intermittent and not associated with exertion speaks to this not being cardiac related. No sign of symptoms being related to PE but this was considered. Suspect symptoms may be related to GERD. Dexilant samples given with instructions.  Will send her for an XR and check labs for neck pain, left arm numbness and weakness.  Plan to have her back in 7-10 days for follow up.  She was advised to seek immediate medical care for any persistent or worsening symptoms. Counseled on stroke-like symptoms and to call 911 if this occurs.

## 2017-02-22 LAB — COMPREHENSIVE METABOLIC PANEL
ALK PHOS: 46 U/L (ref 33–130)
ALT: 14 U/L (ref 6–29)
AST: 14 U/L (ref 10–35)
Albumin: 4.2 g/dL (ref 3.6–5.1)
BUN: 9 mg/dL (ref 7–25)
CO2: 21 mmol/L (ref 20–32)
CREATININE: 0.7 mg/dL (ref 0.50–1.05)
Calcium: 9.2 mg/dL (ref 8.6–10.4)
Chloride: 106 mmol/L (ref 98–110)
Glucose, Bld: 96 mg/dL (ref 65–99)
POTASSIUM: 4.2 mmol/L (ref 3.5–5.3)
SODIUM: 141 mmol/L (ref 135–146)
TOTAL PROTEIN: 6.6 g/dL (ref 6.1–8.1)
Total Bilirubin: 0.3 mg/dL (ref 0.2–1.2)

## 2017-02-22 LAB — TSH: TSH: 1.4 m[IU]/L

## 2017-03-01 ENCOUNTER — Encounter: Payer: Self-pay | Admitting: Family Medicine

## 2017-03-01 ENCOUNTER — Ambulatory Visit
Admission: RE | Admit: 2017-03-01 | Discharge: 2017-03-01 | Disposition: A | Discharge status: Home / Self Care | Payer: BLUE CROSS/BLUE SHIELD | Source: Ambulatory Visit | Attending: Family Medicine | Admitting: Family Medicine

## 2017-03-01 ENCOUNTER — Ambulatory Visit (INDEPENDENT_AMBULATORY_CARE_PROVIDER_SITE_OTHER): Payer: BLUE CROSS/BLUE SHIELD | Admitting: Family Medicine

## 2017-03-01 VITALS — BP 110/70 | HR 96 | Wt 178.4 lb

## 2017-03-01 DIAGNOSIS — R911 Solitary pulmonary nodule: Secondary | ICD-10-CM | POA: Diagnosis not present

## 2017-03-01 DIAGNOSIS — M25512 Pain in left shoulder: Secondary | ICD-10-CM | POA: Diagnosis not present

## 2017-03-01 DIAGNOSIS — R29898 Other symptoms and signs involving the musculoskeletal system: Secondary | ICD-10-CM | POA: Diagnosis not present

## 2017-03-01 DIAGNOSIS — K219 Gastro-esophageal reflux disease without esophagitis: Secondary | ICD-10-CM | POA: Diagnosis not present

## 2017-03-01 DIAGNOSIS — R918 Other nonspecific abnormal finding of lung field: Secondary | ICD-10-CM

## 2017-03-01 DIAGNOSIS — R202 Paresthesia of skin: Secondary | ICD-10-CM | POA: Diagnosis not present

## 2017-03-01 NOTE — Progress Notes (Signed)
   Subjective:    Patient ID: Anita Holland, female    DOB: 25-Mar-1966, 51 y.o.   MRN: [id number]  HPI Chief Complaint  Patient presents with  . follow-up    follow-up on results from Xray   She is here to follow up on abnormal XR result and GERD.  States her reflux symptoms have improved. States she has been taking Dexilant samples and avoiding NSAIDs.   States she is continuing to have left lateral neck and left shoulder and arm pain. States her pain has improved somewhat but now seems to be mostly related to her left shoulder. She has associated paresthesias and weakness in her left arm. States her left shoulder and arm feels "stiff" at night.   Her chest XR result showed an incidental questionable 4-5 mm pulmonary nodule. Recommendation to have a CT. Patient would like to get this done as soon as possible.   Denies fever, chills, dizziness, chest pain, palpitations, shortness of breath, cough, abdominal pain, N/V/D, urinary symptoms, LE edema.   Reviewed allergies, medications, past medical, surgical, and social history.  Review of Systems Pertinent positives and negatives in the history of present illness.     Objective:   Physical Exam  Constitutional: She is oriented to person, place, and time. She appears well-developed and well-nourished. No distress.  Neck: Normal range of motion. Neck supple. No thyromegaly present.  Musculoskeletal:       Left shoulder: She exhibits decreased range of motion, tenderness, pain and decreased strength.  Anterior left shoulder with profound tenderness to palpation and movement. Unable to perform a full exam due to pain. LUE is neurovascularly intact.   Lymphadenopathy:    She has no cervical adenopathy.  Neurological: She is alert and oriented to person, place, and time. No cranial nerve deficit or sensory deficit. Gait normal.  Skin: Skin is warm and dry. No rash noted. No pallor.   BP 110/70   Pulse 96   Wt 178 lb 6.4 oz (80.9 kg)    BMI 31.85 kg/m       Assessment & Plan:  Acute pain of left shoulder - Plan: Ambulatory referral to Orthopedic Surgery, DG Shoulder Left  Left arm weakness - Plan: Ambulatory referral to Orthopedic Surgery, DG Shoulder Left  Paresthesia of left arm - Plan: Ambulatory referral to Orthopedic Surgery, DG Shoulder Left  Incidental pulmonary nodule, > 42mm and < 76mm - Plan: CT Chest Wo Contrast  Gastroesophageal reflux disease, esophagitis presence not specified  Abnormal findings on diagnostic imaging of lung - Plan: CT Chest Wo Contrast  Reviewed cervical XR results and chest XR with patient.   Symptoms appear to be more localized to left shoulder and not as much the cervical region. Profound tenderness makes it difficult to do a full assessment. She will continue using topical analgesics, ice or heat. Wiill get an XR and have her follow up with ortho. Referral made.  GERD- symptoms improved with Dexilant. She will continue to avoid NSAIDs as tolerated and may try OTC prilosec or Nexium as discussed. Lifestyle modifications for GERD reviewed.  Discussed that incidental lung nodule does not require emergent follow up however she is obviously distressed about this finding and would like to proceed with f/u CT. CT ordered.  Follow up pending XR result.

## 2017-03-12 ENCOUNTER — Ambulatory Visit: Payer: BLUE CROSS/BLUE SHIELD | Admitting: Medical

## 2017-03-13 ENCOUNTER — Ambulatory Visit
Admission: RE | Admit: 2017-03-13 | Discharge: 2017-03-13 | Disposition: A | Discharge status: Home / Self Care | Payer: BLUE CROSS/BLUE SHIELD | Source: Ambulatory Visit | Attending: Family Medicine | Admitting: Family Medicine

## 2017-03-13 ENCOUNTER — Other Ambulatory Visit: Payer: Self-pay

## 2017-03-13 DIAGNOSIS — R911 Solitary pulmonary nodule: Secondary | ICD-10-CM

## 2017-03-13 DIAGNOSIS — R918 Other nonspecific abnormal finding of lung field: Secondary | ICD-10-CM

## 2017-03-19 ENCOUNTER — Ambulatory Visit (INDEPENDENT_AMBULATORY_CARE_PROVIDER_SITE_OTHER): Payer: BLUE CROSS/BLUE SHIELD | Admitting: Orthopedic Surgery

## 2017-03-19 DIAGNOSIS — M4302 Spondylolysis, cervical region: Secondary | ICD-10-CM | POA: Insufficient documentation

## 2017-03-19 MED ORDER — PREDNISONE 10 MG PO TABS: 20.0000 mg | ORAL_TABLET | Freq: Every day | ORAL | 0 refills | Status: DC

## 2017-03-19 NOTE — Progress Notes (Signed)
Office Visit Note   Patient: Anita Holland           Date of Birth: 05-Jan-1966           MRN: [id number] Visit Date: 03/19/2017              Requested by: Girtha Rm, NP-C [place], [place] [address] PCP: Girtha Rm, NP-C  Chief Complaint  Patient presents with  . Neck - Pain      HPI: Patient is a 51 year old woman who complains of neck pain that radiates down her arm down to her thumb. She is complaining of weakness in the thumb and tingling sensation all the way down into her thumb. She states the pain is worse at night and she states she's having a hard time lifting anything with her left arm. Patient states she didn't have radiographs obtained of the cervical spine and shoulder about 2 weeks ago.  Assessment & Plan: Visit Diagnoses:  1. Spondylolysis, cervical region     Plan: We will start her on a prednisone Dosepak to start at 20 mg a day and then wean off as the symptoms improved. Follow-up in 2 weeks. If her shoulder is symptomatic at that time we could consider a subacromial injection. She seemed to have more symptoms from her neck radiating to her hand and actual shoulder symptoms at this time. Reevaluate follow-up.  Follow-Up Instructions: Return in about 2 weeks (around 04/02/2017).   Ortho Exam  Patient is alert, oriented, no adenopathy, well-dressed, normal affect, normal respiratory effort. Examination patient has full range of motion of both shoulders. She has mild pain with Neer and Hawkins impingement test of the left shoulder. Radiographs of the left shoulder showed no bony abnormalities. Examination of her cervical spine she has decreased range of motion of her cervical spine she has tenderness to palpation over the thoracic outlet as well as over the spinous processes. She has no focal motor weakness in either upper extremity her grip strength is symmetric but she states she does have weakness in her thumb. Review of radiographs of  the cervical spine shows mild osteophytic bone spurs and distal aspect the cervical spine down to T1.  Imaging: No results found. No images are attached to the encounter.  Labs: No results found for: HGBA1C, ESRSEDRATE, CRP, LABURIC, REPTSTATUS, GRAMSTAIN, CULT, LABORGA  Orders:  No orders of the defined types were placed in this encounter.  Meds ordered this encounter  Medications  . predniSONE (DELTASONE) 10 MG tablet    Sig: Take 2 tablets (20 mg total) by mouth daily with breakfast.    Dispense:  60 tablet    Refill:  0     Procedures: No procedures performed  Clinical Data: No additional findings.  ROS:  All other systems negative, except as noted in the HPI. Review of Systems  Objective: Vital Signs: There were no vitals taken for this visit.  Specialty Comments:  No specialty comments available.  PMFS History: Patient Active Problem List   Diagnosis Date Noted  . 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 GERD (gastroesophageal reflux disease) 03/27/2016   Past Medical History:  Diagnosis Date  . GERD (gastroesophageal reflux disease)     Family History  Problem Relation Age of Onset  . Stroke Sister 10  . Hypertension Sister   . Depression Sister   . Heart attack Sister 71    No past surgical history on file. Social History   Occupational History  .  Not on file.   Social History Main Topics  . Smoking status: Never Smoker  . Smokeless tobacco: Never Used  . Alcohol use No  . Drug use: No  . Sexual activity: Not on file

## 2017-04-02 ENCOUNTER — Ambulatory Visit (INDEPENDENT_AMBULATORY_CARE_PROVIDER_SITE_OTHER): Payer: BLUE CROSS/BLUE SHIELD | Admitting: Orthopedic Surgery

## 2017-04-02 ENCOUNTER — Encounter (INDEPENDENT_AMBULATORY_CARE_PROVIDER_SITE_OTHER): Payer: Self-pay | Admitting: Orthopedic Surgery

## 2017-04-02 DIAGNOSIS — M4302 Spondylolysis, cervical region: Secondary | ICD-10-CM

## 2017-04-02 DIAGNOSIS — M65312 Trigger thumb, left thumb: Secondary | ICD-10-CM

## 2017-04-02 DIAGNOSIS — M7542 Impingement syndrome of left shoulder: Secondary | ICD-10-CM | POA: Diagnosis not present

## 2017-04-02 MED ORDER — LIDOCAINE HCL 1 % IJ SOLN
5.0000 mL | INTRAMUSCULAR | Status: AC | PRN
  Administered 2017-04-02: 5 mL

## 2017-04-02 MED ORDER — METHYLPREDNISOLONE ACETATE 40 MG/ML IJ SUSP
40.0000 mg | INTRAMUSCULAR | Status: AC | PRN
  Administered 2017-04-02: 40 mg via INTRA_ARTICULAR

## 2017-04-02 NOTE — Progress Notes (Signed)
Office Visit Note   Patient: Anita Holland           Date of Birth: 11-Mar-1966           MRN: [id number] Visit Date: 04/02/2017              Requested by: Girtha Rm, NP-C [place], [place] [address] PCP: Girtha Rm, NP-C  Chief Complaint  Patient presents with  . Neck - Follow-up      HPI: Patient presents in follow-up for her left shoulder and cervical spine and left thumb. Patient states that now her thumb is snapping and triggering. She states she does have paraspinous muscle pain in the left side of her neck and is having pain more in her shoulder now pain with reaching overhead pain with activities of daily living the pain hurts worse when she tries to sleep at night.  Assessment & Plan: Visit Diagnoses:  1. Spondylolysis, cervical region   2. Impingement syndrome of left shoulder   3. Trigger thumb, left thumb     Plan: Patient underwent a subacromial injection without complications reevaluate in 3 weeks. Also discussed the option of treatment for her trigger thumb on the left. I do not feel that this is related to her neck or shoulder.  Follow-Up Instructions: Return in about 3 weeks (around 04/23/2017).   Ortho Exam  Patient is alert, oriented, no adenopathy, well-dressed, normal affect, normal respiratory effort. Examination of the patient's left upper extremity she has no radicular symptoms to the hand she does have triggering of the left thumb with palpation across the A1 pulley there is a palpable snap and pop at the A1 pulley she states that this is a area of most pain to the thumb. Examination of the shoulder she does have pain with Neer and Hawkins impingement test pain with drop arm test. Her thoracic outlet is nontender to palpation she has minimal tenderness palpation of medial scapular border and has minimal tenderness to palpation over the paraspinous muscles of the left cervical spine.  Imaging: No results found. No images are  attached to the encounter.  Labs: No results found for: HGBA1C, ESRSEDRATE, CRP, LABURIC, REPTSTATUS, GRAMSTAIN, CULT, LABORGA  Orders:  Orders Placed This Encounter  Procedures  . Large Joint Injection/Arthrocentesis   No orders of the defined types were placed in this encounter.    Procedures: Large Joint Inj Date/Time: 04/02/2017 3:53 PM Performed by: Admiral Marcucci V Authorized by: Newt Minion   Consent Given by:  Patient Site marked: the procedure site was marked   Timeout: prior to procedure the correct patient, procedure, and site was verified   Indications:  Pain and diagnostic evaluation Location:  Shoulder Site:  L subacromial bursa Prep: patient was prepped and draped in usual sterile fashion   Needle Size:  22 G Needle Length:  1.5 inches Approach:  Posterior Ultrasound Guidance: No   Fluoroscopic Guidance: No   Arthrogram: No   Medications:  5 mL lidocaine 1 %; 40 mg methylPREDNISolone acetate 40 MG/ML Aspiration Attempted: No   Patient tolerance:  Patient tolerated the procedure well with no immediate complications    Clinical Data: No additional findings.  ROS:  All other systems negative, except as noted in the HPI. Review of Systems  Objective: Vital Signs: There were no vitals taken for this visit.  Specialty Comments:  No specialty comments available.  PMFS History: Patient Active Problem List   Diagnosis Date Noted  . Impingement  syndrome of left shoulder 04/02/2017  . Trigger thumb, left thumb 04/02/2017  . 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 GERD (gastroesophageal reflux disease) 03/27/2016   Past Medical History:  Diagnosis Date  . GERD (gastroesophageal reflux disease)     Family History  Problem Relation Age of Onset  . Stroke Sister 75  . Hypertension Sister   . Depression Sister   . Heart attack Sister 35    History reviewed. No pertinent surgical history. Social History   Occupational History  . Not on  file.   Social History Main Topics  . Smoking status: Never Smoker  . Smokeless tobacco: Never Used  . Alcohol use No  . Drug use: No  . Sexual activity: Not on file

## 2017-04-23 ENCOUNTER — Ambulatory Visit (INDEPENDENT_AMBULATORY_CARE_PROVIDER_SITE_OTHER): Payer: BLUE CROSS/BLUE SHIELD | Admitting: Orthopedic Surgery

## 2017-04-23 ENCOUNTER — Encounter (INDEPENDENT_AMBULATORY_CARE_PROVIDER_SITE_OTHER): Payer: Self-pay | Admitting: Orthopedic Surgery

## 2017-04-23 DIAGNOSIS — M7542 Impingement syndrome of left shoulder: Secondary | ICD-10-CM | POA: Diagnosis not present

## 2017-04-23 MED ORDER — METHYLPREDNISOLONE ACETATE 40 MG/ML IJ SUSP
40.0000 mg | INTRAMUSCULAR | Status: AC | PRN
  Administered 2017-04-23: 40 mg via INTRA_ARTICULAR

## 2017-04-23 MED ORDER — LIDOCAINE HCL 1 % IJ SOLN
5.0000 mL | INTRAMUSCULAR | Status: AC | PRN
  Administered 2017-04-23: 5 mL

## 2017-04-23 NOTE — Progress Notes (Signed)
Office Visit Note   Patient: Anita Holland           Date of Birth: 1965-07-09           MRN: [id number] Visit Date: 04/23/2017              Requested by: Girtha Rm, NP-C [place], [place] [address] PCP: Girtha Rm, NP-C  Chief Complaint  Patient presents with  . Left Shoulder - Follow-up    Had injection 04/02/17      HPI: Patient is a 51 year old woman who presents in follow-up for impingement symptoms left shoulder.  She states she had only about 2 days relief from the previous injection.  She states she has pain with activities of daily living pain with reaching overhead pain with reaching behind herself.  Assessment & Plan: Visit Diagnoses:  1. Impingement syndrome of left shoulder     Plan: Left shoulder was injected into the subacromial space.  Reevaluate in 4 weeks.  Discussed that if she does not show any improvement with the injections we would proceed with an MRI scan of her shoulder followed by possible arthroscopic intervention.  Follow-Up Instructions: Return in about 4 weeks (around 05/21/2017).   Ortho Exam  Patient is alert, oriented, no adenopathy, well-dressed, normal affect, normal respiratory effort. Examination patient has abduction flexion to 120 degrees on the left she has tenderness to palpation over the biceps tendon she has pain with Neer and Hawkins impingement test pain with a drop arm test she has no pain to palpation over the [facility] joint.  There is a negative sulcus sign.  Imaging: No results found. No images are attached to the encounter.  Labs: No results found for: HGBA1C, ESRSEDRATE, CRP, LABURIC, REPTSTATUS, GRAMSTAIN, CULT, LABORGA  Orders:  No orders of the defined types were placed in this encounter.  No orders of the defined types were placed in this encounter.    Procedures: Large Joint Inj Date/Time: 04/23/2017 3:12 PM Performed by: Grecia Lynk V Authorized by: Newt Minion   Consent Given by:   Patient Site marked: the procedure site was marked   Timeout: prior to procedure the correct patient, procedure, and site was verified   Indications:  Pain and diagnostic evaluation Location:  Shoulder Site:  L subacromial bursa Prep: patient was prepped and draped in usual sterile fashion   Needle Size:  22 G Needle Length:  1.5 inches Approach:  Posterior Ultrasound Guidance: No   Fluoroscopic Guidance: No   Arthrogram: No   Medications:  5 mL lidocaine 1 %; 40 mg methylPREDNISolone acetate 40 MG/ML Aspiration Attempted: No   Patient tolerance:  Patient tolerated the procedure well with no immediate complications    Clinical Data: No additional findings.  ROS:  All other systems negative, except as noted in the HPI. Review of Systems  Objective: Vital Signs: There were no vitals taken for this visit.  Specialty Comments:  No specialty comments available.  PMFS History: Patient Active Problem List   Diagnosis Date Noted  . Impingement syndrome of left shoulder 04/02/2017  . Trigger thumb, left thumb 04/02/2017  . 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 GERD (gastroesophageal reflux disease) 03/27/2016   Past Medical History:  Diagnosis Date  . GERD (gastroesophageal reflux disease)     Family History  Problem Relation Age of Onset  . Stroke Sister 49  . Hypertension Sister   . Depression Sister   . Heart attack Sister 8  No past surgical history on file. Social History   Occupational History  . Not on file.   Social History Main Topics  . Smoking status: Never Smoker  . Smokeless tobacco: Never Used  . Alcohol use No  . Drug use: No  . Sexual activity: Not on file

## 2017-05-02 ENCOUNTER — Encounter: Payer: Self-pay | Admitting: Family Medicine

## 2017-05-02 ENCOUNTER — Ambulatory Visit (INDEPENDENT_AMBULATORY_CARE_PROVIDER_SITE_OTHER): Payer: BLUE CROSS/BLUE SHIELD | Admitting: Family Medicine

## 2017-05-02 ENCOUNTER — Other Ambulatory Visit (HOSPITAL_COMMUNITY)
Admission: RE | Admit: 2017-05-02 | Discharge: 2017-05-02 | Disposition: A | Discharge status: Home / Self Care | Payer: BLUE CROSS/BLUE SHIELD | Source: Ambulatory Visit | Attending: Family Medicine | Admitting: Family Medicine

## 2017-05-02 VITALS — BP 120/82 | HR 99 | Ht 64.0 in | Wt 178.4 lb

## 2017-05-02 DIAGNOSIS — L0211 Cutaneous abscess of neck: Secondary | ICD-10-CM | POA: Diagnosis not present

## 2017-05-02 DIAGNOSIS — Z23 Encounter for immunization: Secondary | ICD-10-CM

## 2017-05-02 DIAGNOSIS — M65312 Trigger thumb, left thumb: Secondary | ICD-10-CM

## 2017-05-02 DIAGNOSIS — Z124 Encounter for screening for malignant neoplasm of cervix: Secondary | ICD-10-CM

## 2017-05-02 DIAGNOSIS — E669 Obesity, unspecified: Secondary | ICD-10-CM | POA: Diagnosis not present

## 2017-05-02 DIAGNOSIS — Z7189 Other specified counseling: Secondary | ICD-10-CM

## 2017-05-02 DIAGNOSIS — R0981 Nasal congestion: Secondary | ICD-10-CM | POA: Diagnosis not present

## 2017-05-02 DIAGNOSIS — Z0000 Encounter for general adult medical examination without abnormal findings: Secondary | ICD-10-CM | POA: Diagnosis not present

## 2017-05-02 DIAGNOSIS — Z1239 Encounter for other screening for malignant neoplasm of breast: Secondary | ICD-10-CM

## 2017-05-02 DIAGNOSIS — Z1211 Encounter for screening for malignant neoplasm of colon: Secondary | ICD-10-CM | POA: Diagnosis not present

## 2017-05-02 DIAGNOSIS — R0982 Postnasal drip: Secondary | ICD-10-CM | POA: Diagnosis not present

## 2017-05-02 DIAGNOSIS — Z7185 Encounter for immunization safety counseling: Secondary | ICD-10-CM

## 2017-05-02 DIAGNOSIS — Z1231 Encounter for screening mammogram for malignant neoplasm of breast: Secondary | ICD-10-CM | POA: Diagnosis not present

## 2017-05-02 LAB — LIPID PANEL
CHOLESTEROL: 214 mg/dL — AB (ref <200)
HDL: 59 mg/dL (ref >50)
LDL Cholesterol (Calc): 126 mg/dL (calc) — ABNORMAL HIGH
Non-HDL Cholesterol (Calc): 155 mg/dL (calc) — ABNORMAL HIGH (ref <130)
Total CHOL/HDL Ratio: 3.6 (calc) (ref <5.0)
Triglycerides: 172 mg/dL — ABNORMAL HIGH (ref <150)

## 2017-05-02 LAB — POCT URINALYSIS DIP (PROADVANTAGE DEVICE)
BILIRUBIN UA: NEGATIVE mg/dL
Bilirubin, UA: NEGATIVE
Blood, UA: NEGATIVE
GLUCOSE UA: NEGATIVE mg/dL
LEUKOCYTES UA: NEGATIVE
Nitrite, UA: NEGATIVE
PH UA: 6 (ref 5.0–8.0)
PROTEIN UA: NEGATIVE mg/dL
Specific Gravity, Urine: 1.015
Urobilinogen, Ur: NEGATIVE

## 2017-05-02 MED ORDER — SULFAMETHOXAZOLE-TRIMETHOPRIM 800-160 MG PO TABS: 1.0000 | ORAL_TABLET | Freq: Two times a day (BID) | ORAL | 0 refills | Status: DC

## 2017-05-02 NOTE — Progress Notes (Signed)
Subjective:    Patient ID: Anita Holland, female    DOB: 1966-04-03, 51 y.o.   MRN: [id number]  HPI Chief Complaint  Patient presents with  . fasting cpe    fasting cpe, declines flu shot. last pap was 2015-normal   She is here for a complete physical exam.  Complains of a knot behind her right ear that has been present for at least 3 days and seems to be getting worse. States area is tender.  Denies history of recurrent abscesses or skin infections. Denies history of MRSA.   Complains of left thumb pain and decreased ROM. States her thumb gets "stuck and cannot bend it". States she saw Dr. Sharol Given but since then her symptoms have gotten worse.   Reports a 3 day history of nasal congestion, sinus pressure, post nasal drainage, sore throat and mild cough.   Other providers: Dr. Mitzie Na   Has 8 adult children. Lives alone.  Diet: unhealthy- fried foods.  Excerise: nothing   Immunizations: declines flu shot. Tdap is unknown.  States she is traveling to [place] soon and would like to get necessary vaccinations.   Declines STD testing   Health maintenance:  Mammogram: 05/2017 Colonoscopy: never  Last Gynecological Exam: Last Menstrual cycle: 04/06/2017  Last Dental Exam: last year  Last Eye Exam: years ago.   Wears seatbelt always,  smoke detectors in home and functioning, does not text while driving and feels safe in home environment.   Reviewed allergies, medications, past medical, surgical, family, and social history.   Review of Systems Review of Systems Constitutional: -fever, -chills, -sweats, -unexpected weight change,-fatigue ENT: -runny nose, -ear pain, +mild sore throat Cardiology:  -chest pain, -palpitations, -edema Respiratory: + mild cough, -shortness of breath, -wheezing Gastroenterology: -abdominal pain, -nausea, -vomiting, -diarrhea, -constipation  Hematology: -bleeding or bruising problems Musculoskeletal: -arthralgias, -myalgias, -joint swelling, -back  pain Ophthalmology: -vision changes Urology: -dysuria, -difficulty urinating, -hematuria, -urinary frequency, -urgency Neurology: -headache, -weakness, -tingling, -numbness       Objective:   Physical Exam BP 120/82   Pulse 99   Ht 5\' 4"  (1.626 m)   Wt 178 lb 6.4 oz (80.9 kg)   LMP 04/06/2017   BMI 30.62 kg/m   General Appearance:    Alert, cooperative, no distress, appears stated age  Head:    Normocephalic, 1.5 in x 1 in firm oval area of induration without fluctuance or drainage. Tenderness with palpation. No surrounding erythema.   Eyes:    PERRL, conjunctiva/corneas clear, EOM's intact, fundi    benign  Ears:    Normal TM's and external ear canals  Nose:   Nares with erythema and edema, no drainage or sinus   tenderness  Throat:   Lips, mucosa, and tongue normal; teeth and gums normal  Neck:   Supple, no lymphadenopathy;  thyroid:  no   enlargement/tenderness/nodules; no carotid   bruit or JVD  Back:    Spine nontender, no curvature, ROM normal, no CVA     tenderness  Lungs:     Clear to auscultation bilaterally without wheezes, rales or     ronchi; respirations unlabored  Chest Wall:    No tenderness or deformity   Heart:    Regular rate and rhythm, S1 and S2 normal, no murmur, rub   or gallop  Breast Exam:    No tenderness, masses, or nipple discharge or inversion.      No axillary lymphadenopathy  Abdomen:     Soft, non-tender, nondistended, normoactive bowel  sounds,    no masses, no hepatosplenomegaly  Genitalia:    Normal external genitalia without lesions.  BUS and vagina normal; cervix without lesions, or cervical motion tenderness. No abnormal vaginal discharge.  Uterus and adnexa not enlarged, nontender, no masses.  Pap performed. Chaperone present.      Extremities:   No clubbing, cyanosis or edema  Pulses:   2+ and symmetric all extremities  Skin:   Skin color, texture, turgor normal, no rashes or lesions  Lymph nodes:   Cervical, supraclavicular, and axillary  nodes normal  Neurologic:   CNII-XII intact, normal strength, sensation and gait; reflexes 2+ and symmetric throughout          Psych:   Normal mood, affect, hygiene and grooming.    Urinalysis dipstick: negative        Assessment & Plan:  Routine general medical examination at a health care facility - Plan: POCT Urinalysis DIP (Proadvantage Device), Lipid panel  Abscess of neck - Plan: sulfamethoxazole-trimethoprim (BACTRIM DS,SEPTRA DS) 800-160 MG tablet  Nasal congestion  Post-nasal drainage  Screening for cervical cancer - Plan: Cytology - PAP  Screening for breast cancer - Plan: MM DIGITAL SCREENING BILATERAL  Trigger thumb, left thumb  Screen for colon cancer - Plan: Ambulatory referral to Gastroenterology  Need for Tdap vaccination - Plan: Tdap vaccine greater than or equal to 50yo IM  Vaccine counseling  Obesity (BMI 30.0-34.9)  URI symptoms appear to be common cold. Recommend OTC supportive care.  Abscess without cellulitis- plan to start her on Bactrim and have her do warm compresses. She will return in 2 days for follow up.  Trigger thumb- she will call Dr. Sharol Given and follow up with him since he has seen her in the past for this.   Mammogram will be due in early 2019.  GI referral for her first colonoscopy.  Discussed healthy diet and exercise. She is aware that her BMI places her slightly in the obese category and potential health risks.  Counseled on all components of Tdap. Tdap given.  Declines flu shot.  [place] travel office number given for travel vaccines.  Follow up pending lab results and pap smear.

## 2017-05-02 NOTE — Patient Instructions (Signed)
Call and schedule your eye exam.   Call and schedule mammogram for January or early 2019.   Start the antibiotic and use warm compresses on the abscess. Follow up Friday with Dr. Redmond School at 2:45pm.   Call Dr. Jess Barters office to see about scheduling an appointment for your thumb.   Call and schedule an appointment with the [facility] office for vaccines for your trip.  [phone]  We will call you with your lab results.   Cut back on carbohydrates, fried foods, and increase your exercise.   Preventative Care for Adults - Female      MAINTAIN REGULAR HEALTH EXAMS:  A routine yearly physical is a good way to check in with your primary care provider about your health and preventive screening. It is also an opportunity to share updates about your health and any concerns you have, and receive a thorough all-over exam.   Most health insurance companies pay for at least some preventative services.  Check with your health plan for specific coverages.  WHAT PREVENTATIVE SERVICES DO WOMEN NEED?  Adult women should have their weight and blood pressure checked regularly.   Women age 50 and older should have their cholesterol levels checked regularly.  Women should be screened for cervical cancer with a Pap smear and pelvic exam beginning at either age 23, or 3 years after they become sexually activity.    Breast cancer screening generally begins at age 10 with a mammogram and breast exam by your primary care provider.    Beginning at age 50 and continuing to age 50, women should be screened for colorectal cancer.  Certain people may need continued testing until age 75.  Updating vaccinations is part of preventative care.  Vaccinations help protect against diseases such as the flu.  Osteoporosis is a disease in which the bones lose minerals and strength as we age. Women ages 50 and over should discuss this with their caregivers, as should women after menopause who have other risk  factors.  Lab tests are generally done as part of preventative care to screen for anemia and blood disorders, to screen for problems with the kidneys and liver, to screen for bladder problems, to check blood sugar, and to check your cholesterol level.  Preventative services generally include counseling about diet, exercise, avoiding tobacco, drugs, excessive alcohol consumption, and sexually transmitted infections.    GENERAL RECOMMENDATIONS FOR GOOD HEALTH:  Healthy diet:  Eat a variety of foods, including fruit, vegetables, animal or vegetable protein, such as meat, fish, chicken, and eggs, or beans, lentils, tofu, and grains, such as rice.  Drink plenty of water daily.  Decrease saturated fat in the diet, avoid lots of red meat, processed foods, sweets, fast foods, and fried foods.  Exercise:  Aerobic exercise helps maintain good heart health. At least 30-40 minutes of moderate-intensity exercise is recommended. For example, a brisk walk that increases your heart rate and breathing. This should be done on most days of the week.   Find a type of exercise or a variety of exercises that you enjoy so that it becomes a part of your daily life.  Examples are running, walking, swimming, water aerobics, and biking.  For motivation and support, explore group exercise such as aerobic class, spin class, Zumba, Yoga,or  martial arts, etc.    Set exercise goals for yourself, such as a certain weight goal, walk or run in a race such as a 5k walk/run.  Speak to your primary care provider about  exercise goals.  Disease prevention:  If you smoke or chew tobacco, find out from your caregiver how to quit. It can literally save your life, no matter how long you have been a tobacco user. If you do not use tobacco, never begin.   Maintain a healthy diet and normal weight. Increased weight leads to problems with blood pressure and diabetes.   The Body Mass Index or BMI is a way of measuring how much of  your body is fat. Having a BMI above 27 increases the risk of heart disease, diabetes, hypertension, stroke and other problems related to obesity. Your caregiver can help determine your BMI and based on it develop an exercise and dietary program to help you achieve or maintain this important measurement at a healthful level.  High blood pressure causes heart and blood vessel problems.  Persistent high blood pressure should be treated with medicine if weight loss and exercise do not work.   Fat and cholesterol leaves deposits in your arteries that can block them. This causes heart disease and vessel disease elsewhere in your body.  If your cholesterol is found to be high, or if you have heart disease or certain other medical conditions, then you may need to have your cholesterol monitored frequently and be treated with medication.   Ask if you should have a cardiac stress test if your history suggests this. A stress test is a test done on a treadmill that looks for heart disease. This test can find disease prior to there being a problem.  Menopause can be associated with physical symptoms and risks. Hormone replacement therapy is available to decrease these. You should talk to your caregiver about whether starting or continuing to take hormones is right for you.   Osteoporosis is a disease in which the bones lose minerals and strength as we age. This can result in serious bone fractures. Risk of osteoporosis can be identified using a bone density scan. Women ages 25 and over should discuss this with their caregivers, as should women after menopause who have other risk factors. Ask your caregiver whether you should be taking a calcium supplement and Vitamin D, to reduce the rate of osteoporosis.   Avoid drinking alcohol in excess (more than two drinks per day).  Avoid use of street drugs. Do not share needles with anyone. Ask for professional help if you need assistance or instructions on stopping the use  of alcohol, cigarettes, and/or drugs.  Brush your teeth twice a day with fluoride toothpaste, and floss once a day. Good oral hygiene prevents tooth decay and gum disease. The problems can be painful, unattractive, and can cause other health problems. Visit your dentist for a routine oral and dental check up and preventive care every 6-12 months.   Look at your skin regularly.  Use a mirror to look at your back. Notify your caregivers of changes in moles, especially if there are changes in shapes, colors, a size larger than a pencil eraser, an irregular border, or development of new moles.  Safety:  Use seatbelts 100% of the time, whether driving or as a passenger.  Use safety devices such as hearing protection if you work in environments with loud noise or significant background noise.  Use safety glasses when doing any work that could send debris in to the eyes.  Use a helmet if you ride a bike or motorcycle.  Use appropriate safety gear for contact sports.  Talk to your caregiver about gun safety.  Use  sunscreen with a SPF (or skin protection factor) of 15 or greater.  Lighter skinned people are at a greater risk of skin cancer. Don't forget to also wear sunglasses in order to protect your eyes from too much damaging sunlight. Damaging sunlight can accelerate cataract formation.   Practice safe sex. Use condoms. Condoms are used for birth control and to help reduce the spread of sexually transmitted infections (or STIs).  Some of the STIs are gonorrhea (the clap), chlamydia, syphilis, trichomonas, herpes, HPV (human papilloma virus) and HIV (human immunodeficiency virus) which causes AIDS. The herpes, HIV and HPV are viral illnesses that have no cure. These can result in disability, cancer and death.   Keep carbon monoxide and smoke detectors in your home functioning at all times. Change the batteries every 6 months or use a model that plugs into the wall.   Vaccinations:  Stay up to date with  your tetanus shots and other required immunizations. You should have a booster for tetanus every 10 years. Be sure to get your flu shot every year, since 5%-20% of the [place] population comes down with the flu. The flu vaccine changes each year, so being vaccinated once is not enough. Get your shot in the fall, before the flu season peaks.   Other vaccines to consider:  Human Papilloma Virus or HPV causes cancer of the cervix, and other infections that can be transmitted from person to person. There is a vaccine for HPV, and females should get immunized between the ages of 53 and 14. It requires a series of 3 shots.   Pneumococcal vaccine to protect against certain types of pneumonia.  This is normally recommended for adults age 47 or older.  However, adults younger than 50 years old with certain underlying conditions such as diabetes, heart or lung disease should also receive the vaccine.  Shingles vaccine to protect against Varicella Zoster if you are older than age 85, or younger than 50 years old with certain underlying illness.  Hepatitis A vaccine to protect against a form of infection of the liver by a virus acquired from food.  Hepatitis B vaccine to protect against a form of infection of the liver by a virus acquired from blood or body fluids, particularly if you work in health care.  If you plan to travel internationally, check with your local health department for specific vaccination recommendations.  Cancer Screening:  Breast cancer screening is essential to preventive care for women. All women age 28 and older should perform a breast self-exam every month. At age 27 and older, women should have their caregiver complete a breast exam each year. Women at ages 109 and older should have a mammogram (x-ray film) of the breasts. Your caregiver can discuss how often you need mammograms.    Cervical cancer screening includes taking a Pap smear (sample of cells examined under a microscope) from  the cervix (end of the uterus). It also includes testing for HPV (Human Papilloma Virus, which can cause cervical cancer). Screening and a pelvic exam should begin at age 41, or 3 years after a woman becomes sexually active. Screening should occur every year, with a Pap smear but no HPV testing, up to age 4. After age 72, you should have a Pap smear every 3 years with HPV testing, if no HPV was found previously.   Most routine colon cancer screening begins at the age of 67. On a yearly basis, doctors may provide special easy to use take-home tests to  check for hidden blood in the stool. Sigmoidoscopy or colonoscopy can detect the earliest forms of colon cancer and is life saving. These tests use a small camera at the end of a tube to directly examine the colon. Speak to your caregiver about this at age 56, when routine screening begins (and is repeated every 5 years unless early forms of pre-cancerous polyps or small growths are found).

## 2017-05-03 ENCOUNTER — Encounter: Payer: Self-pay | Admitting: Internal Medicine

## 2017-05-04 ENCOUNTER — Ambulatory Visit (INDEPENDENT_AMBULATORY_CARE_PROVIDER_SITE_OTHER): Payer: BLUE CROSS/BLUE SHIELD | Admitting: Family Medicine

## 2017-05-04 ENCOUNTER — Encounter: Payer: Self-pay | Admitting: Family Medicine

## 2017-05-04 VITALS — BP 112/60 | HR 88 | Resp 16 | Wt 179.8 lb

## 2017-05-04 DIAGNOSIS — L0211 Cutaneous abscess of neck: Secondary | ICD-10-CM

## 2017-05-04 NOTE — Progress Notes (Signed)
   Subjective:    Patient ID: Anita Holland, female    DOB: 25-Jan-1966, 51 y.o.   MRN: [id number]  HPI  She is here for recheck on the abscess on her neck.  Review of Systems     Objective:   Physical Exam Exam of the posterior right neck in the occipital area shows a 2 x 3 cm fluctuant slightly tender lesion with no surrounding erythema.       Assessment & Plan:  Abscess of neck She was reluctant to have any procedure done on this and so I said to use heat on this and if it started to drain, return here at the beginning of the week for more extensive I and D.

## 2017-05-04 NOTE — Patient Instructions (Signed)
Heat for 20 minutes 3 times per day and if it opens up over the weekend you will need to go to the hospital or come in her Monday

## 2017-05-07 ENCOUNTER — Ambulatory Visit (INDEPENDENT_AMBULATORY_CARE_PROVIDER_SITE_OTHER): Payer: BLUE CROSS/BLUE SHIELD | Admitting: Family Medicine

## 2017-05-07 ENCOUNTER — Encounter: Payer: Self-pay | Admitting: Family Medicine

## 2017-05-07 VITALS — BP 120/60 | HR 93 | Resp 16 | Wt 178.4 lb

## 2017-05-07 DIAGNOSIS — L0211 Cutaneous abscess of neck: Secondary | ICD-10-CM

## 2017-05-07 NOTE — Progress Notes (Signed)
   Subjective:    Patient ID: Anita Holland, female    DOB: [birth year], 51 y.o.   MRN: [id number]  HPI She is here for a recheck.  She states that the swelling and discomfort have diminished.   Review of Systems     Objective:   Physical Exam Alert and in no distress.  A 3 x 4 cm firm lesion is noted in the right posterior lateral neck area.  No drainage is noted.  It is not erythematous.       Assessment & Plan:  Abscess of neck Since this seems to have quieted down and she is not interested in removal, I will have her continue with conservative care but did caution that if this thing recurs, we should open it up and drain it as it most likely is a sebaceous cyst that has become infected.

## 2017-05-08 ENCOUNTER — Other Ambulatory Visit: Payer: Self-pay | Admitting: Family Medicine

## 2017-05-08 ENCOUNTER — Encounter: Payer: Self-pay | Admitting: Family Medicine

## 2017-05-08 DIAGNOSIS — R87619 Unspecified abnormal cytological findings in specimens from cervix uteri: Secondary | ICD-10-CM

## 2017-05-08 LAB — CYTOLOGY - PAP: HPV (WINDOPATH): NOT DETECTED

## 2017-05-14 ENCOUNTER — Encounter: Payer: Self-pay | Admitting: Internal Medicine

## 2017-05-21 ENCOUNTER — Ambulatory Visit (INDEPENDENT_AMBULATORY_CARE_PROVIDER_SITE_OTHER): Payer: BLUE CROSS/BLUE SHIELD | Admitting: Orthopedic Surgery

## 2017-05-21 ENCOUNTER — Encounter (INDEPENDENT_AMBULATORY_CARE_PROVIDER_SITE_OTHER): Payer: Self-pay | Admitting: Orthopedic Surgery

## 2017-05-21 VITALS — Ht 64.0 in | Wt 178.0 lb

## 2017-05-21 DIAGNOSIS — M7542 Impingement syndrome of left shoulder: Secondary | ICD-10-CM | POA: Diagnosis not present

## 2017-05-21 DIAGNOSIS — M65312 Trigger thumb, left thumb: Secondary | ICD-10-CM

## 2017-05-21 NOTE — Progress Notes (Signed)
Office Visit Note   Patient: Anita Holland           Date of Birth: August 04, 1965           MRN: [id number] Visit Date: 05/21/2017              Requested by: Girtha Rm, NP-C [place], [place] [address] PCP: Girtha Rm, NP-C  Chief Complaint  Patient presents with  . Left Shoulder - Follow-up    S/p injection 04/23/17  . Left Hand - Pain    Left thumb pain NKI pain for 3-4 months      HPI: Patient is a 51 year old woman who presents in follow-up for her left shoulder impingement syndrome and triggering of her left thumb which she states is been going on for 4 months.  She can manually passively move her thumb but she states that active movement causes triggering.  Patient states she has decreased grip strength in the left hand.  She states her shoulder is still feeling good after the injection.  Assessment & Plan: Visit Diagnoses:  1. Impingement syndrome of left shoulder   2. Trigger thumb, left thumb     Plan: Continue with scapular stabilization exercises for the left shoulder.  Discussed that she could proceed with anti-inflammatories as well as paraffin baths to help with the triggering of the left thumb.  Discussed that if she fails conservative therapy we could proceed with surgical intervention with release of the A1 pulley.  Patient states she is not interested in surgery at this time.  Follow-Up Instructions: Return if symptoms worsen or fail to improve.   Ortho Exam  Patient is alert, oriented, no adenopathy, well-dressed, normal affect, normal respiratory effort. On examination patient has full range of motion of both shoulders no pain with Neer and Hawkins impingement test.  Examination of the left thumb there is palpable triggering at the A1 pulley there is no redness no cellulitis her hand is neurovascular intact no signs of infection.  The triggering does not cause locking.  Imaging: No results found. No images are attached to the  encounter.  Labs: No results found for: HGBA1C, ESRSEDRATE, CRP, LABURIC, REPTSTATUS, GRAMSTAIN, CULT, LABORGA  @LABSALLVALUES (HGBA1)@  @BMI1 @  Orders:  No orders of the defined types were placed in this encounter.  No orders of the defined types were placed in this encounter.    Procedures: No procedures performed  Clinical Data: No additional findings.  ROS:  All other systems negative, except as noted in the HPI. Review of Systems  Objective: Vital Signs: Ht 5\' 4"  (1.626 m)   Wt 178 lb (80.7 kg)   BMI 30.55 kg/m   Specialty Comments:  No specialty comments available.  PMFS History: Patient Active Problem List   Diagnosis Date Noted  . Abnormal Pap smear of cervix 05/08/2017  . Impingement syndrome of left shoulder 04/02/2017  . Trigger thumb, left thumb 04/02/2017  . 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 GERD (gastroesophageal reflux disease) 03/27/2016   Past Medical History:  Diagnosis Date  . GERD (gastroesophageal reflux disease)     Family History  Problem Relation Age of Onset  . Stroke Sister 62  . Hypertension Sister   . Depression Sister   . Heart attack Sister 63    History reviewed. No pertinent surgical history. Social History   Occupational History  . Not on file  Tobacco Use  . Smoking status: Never Smoker  . Smokeless tobacco: Never Used  Substance  and Sexual Activity  . Alcohol use: No  . Drug use: No  . Sexual activity: Not Currently

## 2017-05-22 ENCOUNTER — Other Ambulatory Visit: Payer: Self-pay | Admitting: Family Medicine

## 2017-05-22 DIAGNOSIS — Z1231 Encounter for screening mammogram for malignant neoplasm of breast: Secondary | ICD-10-CM

## 2017-06-06 ENCOUNTER — Ambulatory Visit: Payer: BLUE CROSS/BLUE SHIELD | Admitting: Gynecology

## 2017-06-20 ENCOUNTER — Ambulatory Visit: Payer: Self-pay

## 2017-06-26 DIAGNOSIS — R87612 Low grade squamous intraepithelial lesion on cytologic smear of cervix (LGSIL): Secondary | ICD-10-CM

## 2017-06-26 HISTORY — DX: Low grade squamous intraepithelial lesion on cytologic smear of cervix (LGSIL): R87.612

## 2017-06-27 ENCOUNTER — Encounter: Payer: Self-pay | Admitting: Gynecology

## 2017-06-27 ENCOUNTER — Ambulatory Visit: Payer: BLUE CROSS/BLUE SHIELD | Admitting: Gynecology

## 2017-06-27 VITALS — BP 122/80 | Ht 62.0 in | Wt 180.0 lb

## 2017-06-27 DIAGNOSIS — R87612 Low grade squamous intraepithelial lesion on cytologic smear of cervix (LGSIL): Secondary | ICD-10-CM

## 2017-06-27 NOTE — Progress Notes (Signed)
    Anita Holland 1965-07-05 [id number]        52 y.o.  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  Has not had a Pap smear in 4-5 years by her history.  Does relate an abnormal Pap smear in the 1980s proceeding a delivery during pregnancy for which historically she was not treated, underwent vaginal delivery and on follow-up her Pap smears were normal.  No other gynecologic issues.  Past medical history,surgical history, problem list, medications, allergies, family history and social history were all reviewed and documented in the EPIC chart.  Directed ROS with pertinent positives and negatives documented in the history of present illness/assessment and plan.  Exam: Caryn Bee assistant Vitals:   06/27/17 1602  BP: 122/80  Weight: 180 lb (81.6 kg)  Height: 5\' 2"  (1.575 m)   General appearance:  Normal Abdomen soft nontender without masses guarding rebound Pelvic external BUS vagina normal.  Cervix grossly normal.  Uterus generous in size midline mobile nontender.  Adnexa without masses or tenderness Physical Exam  Genitourinary:       Colposcopy performed after acetic acid cleanse is adequate using endocervical speculum noting inflammatory changes at the transformation zone at 12 and 6:00.  No acetowhite, punctations, mosaicism or significantly atypical vessels.  Random biopsies x2 at 12:00, x2 at 6:00 and ECC performed.  Assessment/Plan:  52 y.o. 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  Colposcopy shows inflammatory changes 12:00 and 6:00.  Random multiple biopsies taken in both these areas.  ECC performed.  I reviewed with patient the whole issue of dysplasia, high-grade/low-grade, progression/regression in the possible HPV association.  Patient will follow-up for biopsy results and then will triaged based upon these results.    Anastasio Auerbach MD,  5:18 PM 06/27/2017

## 2017-06-27 NOTE — Patient Instructions (Signed)
Office will call you with biopsy results 

## 2017-07-03 ENCOUNTER — Ambulatory Visit (AMBULATORY_SURGERY_CENTER): Payer: Self-pay | Admitting: *Deleted

## 2017-07-03 ENCOUNTER — Other Ambulatory Visit: Payer: Self-pay

## 2017-07-03 VITALS — Ht 63.0 in | Wt 180.8 lb

## 2017-07-03 DIAGNOSIS — Z1211 Encounter for screening for malignant neoplasm of colon: Secondary | ICD-10-CM

## 2017-07-03 MED ORDER — NA SULFATE-K SULFATE-MG SULF 17.5-3.13-1.6 GM/177ML PO SOLN: 1.0000 [IU] | Freq: Once | ORAL | 0 refills | Status: AC

## 2017-07-03 NOTE — Progress Notes (Signed)
No egg or soy allergy known to patient  No issues with past sedation with any surgeries  or procedures, no intubation problems  No diet pills per patient No home 02 use per patient  No blood thinners per patient  Pt denies issues with constipation  No A fib or A flutter  EMMI video sent to pt's e mail  

## 2017-07-04 LAB — TISSUE PATH REPORT 10802

## 2017-07-04 LAB — PATHOLOGY

## 2017-07-05 ENCOUNTER — Encounter: Payer: Self-pay | Admitting: Gynecology

## 2017-07-06 ENCOUNTER — Other Ambulatory Visit: Payer: Self-pay | Admitting: Gynecology

## 2017-07-06 MED ORDER — DOXYCYCLINE HYCLATE 100 MG PO CAPS: 100.0000 mg | ORAL_CAPSULE | Freq: Two times a day (BID) | ORAL | 0 refills | Status: DC

## 2017-07-10 ENCOUNTER — Telehealth: Payer: Self-pay | Admitting: Internal Medicine

## 2017-07-10 MED ORDER — NA SULFATE-K SULFATE-MG SULF 17.5-3.13-1.6 GM/177ML PO SOLN: 1.0000 | Freq: Once | ORAL | 0 refills | Status: AC

## 2017-07-10 NOTE — Telephone Encounter (Signed)
Patient states she needs prep for colon on 1.21.19 resent to [facility]. Pt had pv on 1.8.19

## 2017-07-10 NOTE — Telephone Encounter (Signed)
[facility]ed [facility]- [facility]ent to [employer] [facility]- pt aware   Lelan Pons pv

## 2017-07-16 ENCOUNTER — Other Ambulatory Visit: Payer: Self-pay

## 2017-07-16 ENCOUNTER — Ambulatory Visit (AMBULATORY_SURGERY_CENTER): Payer: BLUE CROSS/BLUE SHIELD | Admitting: Internal Medicine

## 2017-07-16 ENCOUNTER — Encounter: Payer: Self-pay | Admitting: Internal Medicine

## 2017-07-16 VITALS — BP 122/80 | HR 80 | Temp 97.8°F | Resp 17 | Ht 63.0 in | Wt 180.0 lb

## 2017-07-16 DIAGNOSIS — D129 Benign neoplasm of anus and anal canal: Secondary | ICD-10-CM

## 2017-07-16 DIAGNOSIS — Z1212 Encounter for screening for malignant neoplasm of rectum: Secondary | ICD-10-CM | POA: Diagnosis not present

## 2017-07-16 DIAGNOSIS — D128 Benign neoplasm of rectum: Secondary | ICD-10-CM

## 2017-07-16 DIAGNOSIS — K621 Rectal polyp: Secondary | ICD-10-CM | POA: Diagnosis not present

## 2017-07-16 DIAGNOSIS — Z1211 Encounter for screening for malignant neoplasm of colon: Secondary | ICD-10-CM | POA: Diagnosis not present

## 2017-07-16 DIAGNOSIS — D123 Benign neoplasm of transverse colon: Secondary | ICD-10-CM | POA: Diagnosis not present

## 2017-07-16 MED ORDER — SODIUM CHLORIDE 0.9 % IV SOLN: 500.0000 mL | Freq: Once | INTRAVENOUS | Status: DC

## 2017-07-16 NOTE — Progress Notes (Signed)
Pt's states no medical or surgical changes since previsit or office visit.No  Allergy to soy or eggs

## 2017-07-16 NOTE — Progress Notes (Signed)
Called to room to assist during endoscopic procedure.  Patient ID and intended procedure confirmed with present staff. Received instructions for my participation in the procedure from the performing physician.  

## 2017-07-16 NOTE — Op Note (Signed)
[facility] Patient Name: Anita Holland Procedure Date: 07/16/2017 3:16 PM MRN: [id number] Endoscopist: Jerene Bears , MD Age: 52 Referring MD:  Date of Birth: 1965/07/20 Gender: Female Account #: [id number] Procedure:                Colonoscopy Indications:              Screening for colorectal malignant neoplasm, This                            is the patient's first colonoscopy Medicines:                Monitored Anesthesia Care Procedure:                Pre-Anesthesia Assessment:                           - Prior to the procedure, a History and Physical                            was performed, and patient medications and                            allergies were reviewed. The patient's tolerance of                            previous anesthesia was also reviewed. The risks                            and benefits of the procedure and the sedation                            options and risks were discussed with the patient.                            All questions were answered, and informed consent                            was obtained. Prior Anticoagulants: The patient has                            taken no previous anticoagulant or antiplatelet                            agents. ASA Grade Assessment: II - A patient with                            mild systemic disease. After reviewing the risks                            and benefits, the patient was deemed in                            satisfactory condition to undergo the procedure.  After obtaining informed consent, the colonoscope                            was passed under direct vision. Throughout the                            procedure, the patient's blood pressure, pulse, and                            oxygen saturations were monitored continuously. The                            Colonoscope was introduced through the anus and                            advanced to the the cecum,  identified by                            appendiceal orifice and ileocecal valve. The                            colonoscopy was performed without difficulty. The                            patient tolerated the procedure well. The quality                            of the bowel preparation was good. The ileocecal                            valve, appendiceal orifice, and rectum were                            photographed. Scope In: 3:36:36 PM Scope Out: 3:50:09 PM Scope Withdrawal Time: 0 hours 11 minutes 42 seconds  Total Procedure Duration: 0 hours 13 minutes 33 seconds  Findings:                 The digital rectal exam was normal.                           A 3 mm polyp was found in the transverse colon. The                            polyp was sessile. The polyp was removed with a                            cold snare. Resection and retrieval were complete.                           A 9 mm polyp was found in the transverse colon. The                            polyp was pedunculated. The polyp was  removed with                            a hot snare. Resection and retrieval were complete.                           A 5 mm polyp was found in the rectum. The polyp was                            sessile. The polyp was removed with a cold snare.                            Resection and retrieval were complete.                           Multiple small and large-mouthed diverticula were                            found in the sigmoid colon and descending colon.                           Internal hemorrhoids were found during                            retroflexion. The hemorrhoids were small. Complications:            No immediate complications. Estimated Blood Loss:     Estimated blood loss was minimal. Impression:               - One 3 mm polyp in the transverse colon, removed                            with a cold snare. Resected and retrieved.                           - One 9 mm polyp  in the transverse colon, removed                            with a hot snare. Resected and retrieved.                           - One 5 mm polyp in the rectum, removed with a cold                            snare. Resected and retrieved.                           - Moderate diverticulosis in the sigmoid colon and                            in the descending colon.                           - Small internal hemorrhoids. Recommendation:           -  Patient has a contact number available for                            emergencies. The signs and symptoms of potential                            delayed complications were discussed with the                            patient. Return to normal activities tomorrow.                            Written discharge instructions were provided to the                            patient.                           - Resume previous diet.                           - Continue present medications.                           - Await pathology results.                           - Repeat colonoscopy is recommended for                            surveillance. The colonoscopy date will be                            determined after pathology results from today's                            exam become available for review.                           - No ibuprofen, naproxen, or other non-steroidal                            anti-inflammatory drugs for 2 weeks after polyp                            removal. Jerene Bears, MD 07/16/2017 3:57:38 PM This report has been signed electronically.

## 2017-07-16 NOTE — Progress Notes (Signed)
Report given to PACU, vss 

## 2017-07-16 NOTE — Patient Instructions (Signed)
*  Handouts given to pt on polyps, tics, and hems**  YOU HAD AN ENDOSCOPIC PROCEDURE TODAY AT [facility]:   Refer to the procedure report that was given to you for any specific questions about what was found during the examination.  If the procedure report does not answer your questions, please call your gastroenterologist to clarify.  If you requested that your care partner not be given the details of your procedure findings, then the procedure report has been included in a sealed envelope for you to review at your convenience later.  YOU SHOULD EXPECT: Some feelings of bloating in the abdomen. Passage of more gas than usual.  Walking can help get rid of the air that was put into your GI tract during the procedure and reduce the bloating. If you had a lower endoscopy (such as a colonoscopy or flexible sigmoidoscopy) you may notice spotting of blood in your stool or on the toilet paper. If you underwent a bowel prep for your procedure, you may not have a normal bowel movement for a few days.  Please Note:  You might notice some irritation and congestion in your nose or some drainage.  This is from the oxygen used during your procedure.  There is no need for concern and it should clear up in a day or so.  SYMPTOMS TO REPORT IMMEDIATELY:   Following lower endoscopy (colonoscopy or flexible sigmoidoscopy):  Excessive amounts of blood in the stool  Significant tenderness or worsening of abdominal pains  Swelling of the abdomen that is new, acute  Fever of 100F or higher   For urgent or emergent issues, a gastroenterologist can be reached at any hour by calling [phone].   DIET:  We do recommend a small meal at first, but then you may proceed to your regular diet.  Drink plenty of fluids but you should avoid alcoholic beverages for 24 hours.  ACTIVITY:  You should plan to take it easy for the rest of today and you should NOT DRIVE or use heavy machinery until tomorrow  (because of the sedation medicines used during the test).    FOLLOW UP: Our staff will call the number listed on your records the next business day following your procedure to check on you and address any questions or concerns that you may have regarding the information given to you following your procedure. If we do not reach you, we will leave a message.  However, if you are feeling well and you are not experiencing any problems, there is no need to return our call.  We will assume that you have returned to your regular daily activities without incident.  If any biopsies were taken you will be contacted by phone or by letter within the next 1-3 weeks.  Please call us at [phone] if you have not heard about the biopsies in 3 weeks.    SIGNATURES/CONFIDENTIALITY: You and/or your care partner have signed paperwork which will be entered into your electronic medical record.  These signatures attest to the fact that that the information above on your After Visit Summary has been reviewed and is understood.  Full responsibility of the confidentiality of this discharge information lies with you and/or your care-partner.

## 2017-07-17 ENCOUNTER — Telehealth: Payer: Self-pay

## 2017-07-17 ENCOUNTER — Telehealth: Payer: Self-pay | Admitting: *Deleted

## 2017-07-17 NOTE — Telephone Encounter (Signed)
   no answer, second call. Left message to call if questions or concerns.

## 2017-07-17 NOTE — Telephone Encounter (Signed)
Left mesage

## 2017-07-20 ENCOUNTER — Encounter: Payer: Self-pay | Admitting: Internal Medicine

## 2017-10-28 ENCOUNTER — Emergency Department (HOSPITAL_COMMUNITY): Payer: BLUE CROSS/BLUE SHIELD

## 2017-10-28 ENCOUNTER — Encounter (HOSPITAL_COMMUNITY): Payer: Self-pay

## 2017-10-28 ENCOUNTER — Other Ambulatory Visit: Payer: Self-pay

## 2017-10-28 ENCOUNTER — Inpatient Hospital Stay (HOSPITAL_COMMUNITY)
Admission: EM | Admit: 2017-10-28 | Discharge: 2017-10-31 | DRG: 378 | Disposition: A | Discharge status: Home / Self Care | Payer: BLUE CROSS/BLUE SHIELD | Attending: Internal Medicine | Admitting: Internal Medicine

## 2017-10-28 DIAGNOSIS — K2210 Ulcer of esophagus without bleeding: Secondary | ICD-10-CM | POA: Diagnosis present

## 2017-10-28 DIAGNOSIS — K922 Gastrointestinal hemorrhage, unspecified: Secondary | ICD-10-CM | POA: Diagnosis not present

## 2017-10-28 DIAGNOSIS — N132 Hydronephrosis with renal and ureteral calculous obstruction: Secondary | ICD-10-CM | POA: Diagnosis present

## 2017-10-28 DIAGNOSIS — N939 Abnormal uterine and vaginal bleeding, unspecified: Secondary | ICD-10-CM | POA: Diagnosis not present

## 2017-10-28 DIAGNOSIS — D649 Anemia, unspecified: Secondary | ICD-10-CM | POA: Diagnosis not present

## 2017-10-28 DIAGNOSIS — R112 Nausea with vomiting, unspecified: Secondary | ICD-10-CM

## 2017-10-28 DIAGNOSIS — K254 Chronic or unspecified gastric ulcer with hemorrhage: Secondary | ICD-10-CM | POA: Diagnosis present

## 2017-10-28 DIAGNOSIS — R710 Precipitous drop in hematocrit: Secondary | ICD-10-CM | POA: Diagnosis not present

## 2017-10-28 DIAGNOSIS — D62 Acute posthemorrhagic anemia: Secondary | ICD-10-CM | POA: Diagnosis present

## 2017-10-28 DIAGNOSIS — K921 Melena: Secondary | ICD-10-CM | POA: Diagnosis not present

## 2017-10-28 DIAGNOSIS — D509 Iron deficiency anemia, unspecified: Secondary | ICD-10-CM | POA: Diagnosis present

## 2017-10-28 DIAGNOSIS — E876 Hypokalemia: Secondary | ICD-10-CM

## 2017-10-28 DIAGNOSIS — K920 Hematemesis: Secondary | ICD-10-CM | POA: Diagnosis present

## 2017-10-28 DIAGNOSIS — K219 Gastro-esophageal reflux disease without esophagitis: Secondary | ICD-10-CM | POA: Diagnosis present

## 2017-10-28 DIAGNOSIS — B9681 Helicobacter pylori [H. pylori] as the cause of diseases classified elsewhere: Secondary | ICD-10-CM | POA: Diagnosis not present

## 2017-10-28 DIAGNOSIS — Z87410 Personal history of cervical dysplasia: Secondary | ICD-10-CM

## 2017-10-28 DIAGNOSIS — R109 Unspecified abdominal pain: Secondary | ICD-10-CM

## 2017-10-28 DIAGNOSIS — N920 Excessive and frequent menstruation with regular cycle: Secondary | ICD-10-CM | POA: Diagnosis present

## 2017-10-28 DIAGNOSIS — R197 Diarrhea, unspecified: Secondary | ICD-10-CM

## 2017-10-28 DIAGNOSIS — K208 Other esophagitis: Secondary | ICD-10-CM | POA: Diagnosis not present

## 2017-10-28 DIAGNOSIS — Z86010 Personal history of colonic polyps: Secondary | ICD-10-CM | POA: Diagnosis not present

## 2017-10-28 DIAGNOSIS — K2971 Gastritis, unspecified, with bleeding: Secondary | ICD-10-CM | POA: Diagnosis not present

## 2017-10-28 HISTORY — DX: Gastrointestinal hemorrhage, unspecified: K92.2

## 2017-10-28 LAB — CBC WITH DIFFERENTIAL/PLATELET
BASOS PCT: 0 %
Basophils Absolute: 0 10*3/uL (ref 0.0–0.1)
EOS ABS: 0.3 10*3/uL (ref 0.0–0.7)
Eosinophils Relative: 3 %
HCT: 24.7 % — ABNORMAL LOW (ref 36.0–46.0)
Hemoglobin: 7.9 g/dL — ABNORMAL LOW (ref 12.0–15.0)
LYMPHS ABS: 1.8 10*3/uL (ref 0.7–4.0)
Lymphocytes Relative: 20 %
MCH: 28.6 pg (ref 26.0–34.0)
MCHC: 32 g/dL (ref 30.0–36.0)
MCV: 89.5 fL (ref 78.0–100.0)
Monocytes Absolute: 0.6 10*3/uL (ref 0.1–1.0)
Monocytes Relative: 6 %
NEUTROS ABS: 6.3 10*3/uL (ref 1.7–7.7)
NEUTROS PCT: 71 %
PLATELETS: 319 10*3/uL (ref 150–400)
RBC: 2.76 MIL/uL — ABNORMAL LOW (ref 3.87–5.11)
RDW: 14.7 % (ref 11.5–15.5)
WBC: 9.1 10*3/uL (ref 4.0–10.5)

## 2017-10-28 LAB — COMPREHENSIVE METABOLIC PANEL
ALBUMIN: 3.5 g/dL (ref 3.5–5.0)
ALT: 13 U/L — ABNORMAL LOW (ref 14–54)
AST: 15 U/L (ref 15–41)
Alkaline Phosphatase: 29 U/L — ABNORMAL LOW (ref 38–126)
Anion gap: 8 (ref 5–15)
BILIRUBIN TOTAL: 0.1 mg/dL — AB (ref 0.3–1.2)
BUN: 25 mg/dL — AB (ref 6–20)
CO2: 21 mmol/L — ABNORMAL LOW (ref 22–32)
Calcium: 8.4 mg/dL — ABNORMAL LOW (ref 8.9–10.3)
Chloride: 110 mmol/L (ref 101–111)
Creatinine, Ser: 0.63 mg/dL (ref 0.44–1.00)
GFR calc Af Amer: >60 mL/min (ref >60)
GLUCOSE: 127 mg/dL — AB (ref 65–99)
POTASSIUM: 3.2 mmol/L — AB (ref 3.5–5.1)
Sodium: 139 mmol/L (ref 135–145)
TOTAL PROTEIN: 6 g/dL — AB (ref 6.5–8.1)

## 2017-10-28 LAB — URINALYSIS, ROUTINE W REFLEX MICROSCOPIC
Bilirubin Urine: NEGATIVE
GLUCOSE, UA: NEGATIVE mg/dL
Ketones, ur: NEGATIVE mg/dL
Leukocytes, UA: NEGATIVE
Nitrite: NEGATIVE
PROTEIN: NEGATIVE mg/dL
SPECIFIC GRAVITY, URINE: 1.003 — AB (ref 1.005–1.030)
pH: 6 (ref 5.0–8.0)

## 2017-10-28 LAB — ABO/RH: ABO/RH(D): O POS

## 2017-10-28 LAB — RETICULOCYTES
RBC.: 2.79 MIL/uL — ABNORMAL LOW (ref 3.87–5.11)
Retic Count, Absolute: 111.6 10*3/uL (ref 19.0–186.0)
Retic Ct Pct: 4 % — ABNORMAL HIGH (ref 0.4–3.1)

## 2017-10-28 LAB — PREPARE RBC (CROSSMATCH)

## 2017-10-28 LAB — I-STAT BETA HCG BLOOD, ED (MC, WL, AP ONLY)

## 2017-10-28 LAB — LIPASE, BLOOD: Lipase: 25 U/L (ref 11–51)

## 2017-10-28 MED ORDER — PANTOPRAZOLE SODIUM 40 MG IV SOLR
40.0000 mg | Freq: Once | INTRAVENOUS | Status: AC
  Administered 2017-10-28: 40 mg via INTRAVENOUS
  Filled 2017-10-28: qty 40

## 2017-10-28 MED ORDER — SODIUM CHLORIDE 0.9 % IV SOLN
10.0000 mL/h | Freq: Once | INTRAVENOUS | Status: AC
  Administered 2017-10-28: 10 mL/h via INTRAVENOUS

## 2017-10-28 MED ORDER — PANTOPRAZOLE SODIUM 40 MG IV SOLR: 40.0000 mg | Freq: Two times a day (BID) | INTRAVENOUS | Status: DC

## 2017-10-28 MED ORDER — POTASSIUM CHLORIDE 10 MEQ/100ML IV SOLN
10.0000 meq | INTRAVENOUS | Status: AC
  Administered 2017-10-28 – 2017-10-29 (×2): 10 meq via INTRAVENOUS
  Filled 2017-10-28 (×3): qty 100

## 2017-10-28 MED ORDER — SODIUM CHLORIDE 0.9 % IV SOLN
8.0000 mg/h | INTRAVENOUS | Status: DC
  Administered 2017-10-28 – 2017-10-29 (×2): 8 mg/h via INTRAVENOUS
  Filled 2017-10-28 (×6): qty 80

## 2017-10-28 MED ORDER — IOPAMIDOL (ISOVUE-300) INJECTION 61%
INTRAVENOUS | Status: AC
  Administered 2017-10-28: 100 mL via INTRAVENOUS
  Filled 2017-10-28: qty 100

## 2017-10-28 MED ORDER — SODIUM CHLORIDE 0.9 % IV SOLN
INTRAVENOUS | Status: DC
  Administered 2017-10-28 – 2017-10-29 (×3): via INTRAVENOUS

## 2017-10-28 MED ORDER — SODIUM CHLORIDE 0.9 % IV BOLUS
1000.0000 mL | Freq: Once | INTRAVENOUS | Status: AC
  Administered 2017-10-28: 1000 mL via INTRAVENOUS

## 2017-10-28 MED ORDER — ONDANSETRON HCL 4 MG/2ML IJ SOLN: 4.0000 mg | Freq: Once | INTRAMUSCULAR | Status: DC

## 2017-10-28 NOTE — H&P (Signed)
Anita Holland XTG:[id number] DOB: 28-May-1966 DOA: 10/28/2017     PCP: Girtha Rm, NP-C   Outpatient Specialists:  GYN Donalynn Furlong GI: [facility] Patient arrived to ER on 10/28/17 at 1923  Patient coming from:    home Lives alone,      Chief Complaint: Nausea vomiting diarrhea black stools HPI: Anita Holland is a 52 y.o. female with medical history significant of colonic polyps high-grade cervical dysplasia     Presented with  Nausea vomiting and loose bowel movements for the past 3 days unable to keep p.o.  Initially had hematemesis but now reports tarry stools and coffee-ground emesis She did eat at [facility] express day before this started and felt unwell she ate hot dog at night after this next day she felt hot and had a vomiting episode of beet red fluid mixed with food.  She also  has had 2-3 bowel movements a day that have been black. Denies retching. Not on iron supplements.  She did have a red Jell-O today   3 days ago she started menstrual period which was unusually heavy for her and passing clots.  Does endorse shortness of breath and overall has been feeling very tired and lightheaded she has been having right lower quadrant abdominal pain which she took Tylenol for and seemed to have had some improvement.  Of note last colonoscopy was done by Dr. Hilarie Fredrickson in January 2019 did show some polyps which were removed and internal hemorrhoids.  Otherwise no chest pain.  Patient does not drink alcohol denies use of NSAIDs currently last use was years ago.  Not on the blood thinners no recent antibiotic use no history of C. difficile or travel history. Denies epigastric pain.       While in ER:  Hemoglobin noted to be down to 7.9 she was typed and screened Patient at this point wishes to avoid rectal and or pelvic exam Following Medications were ordered in ER: Medications  0.9 %  sodium chloride infusion (has no administration in time range)  sodium chloride 0.9 % bolus 1,000 mL  (1,000 mLs Intravenous New Bag/Given 10/28/17 2102)    And  0.9 %  sodium chloride infusion (has no administration in time range)  ondansetron (ZOFRAN) injection 4 mg (4 mg Intravenous Refused 10/28/17 2123)  pantoprazole (PROTONIX) injection 40 mg (40 mg Intravenous Given 10/28/17 2118)    Significant initial  Findings: Abnormal Labs Reviewed  COMPREHENSIVE METABOLIC PANEL - Abnormal; Notable for the following components:      Result Value   Potassium 3.2 (*)    CO2 21 (*)    Glucose, Bld 127 (*)    BUN 25 (*)    Calcium 8.4 (*)    Total Protein 6.0 (*)    ALT 13 (*)    Alkaline Phosphatase 29 (*)    Total Bilirubin 0.1 (*)    All other components within normal limits  CBC WITH DIFFERENTIAL/PLATELET - Abnormal; Notable for the following components:   RBC 2.76 (*)    Hemoglobin 7.9 (*)    HCT 24.7 (*)    All other components within normal limits    Na  139 K 3.2 BUN 25 Cr   stable,    Lab Results  Component Value Date   CREATININE 0.63 10/28/2017   CREATININE 0.70 02/21/2017   CREATININE 0.59 03/27/2016      WBC  9.1  HG/HCT  Down  from baseline see below in August 2018 hemoglobin was 13.1  Component Value Date/Time   HGB 7.9 (L) 10/28/2017 2011   HCT 24.7 (L) 10/28/2017 2011     CTabd/pelvis - ordere  ECG: Ordered    ED Triage Vitals  Enc Vitals Group     BP 10/28/17 1925 118/83     Pulse Rate 10/28/17 1925 (!) 119     Resp 10/28/17 1925 18     Temp 10/28/17 1925 98.8 F (37.1 C)     Temp Source 10/28/17 1925 Oral     SpO2 10/28/17 1925 100 %     Weight 10/28/17 1925 176 lb (79.8 kg)     Height --      Head Circumference --      Peak Flow --      Pain Score 10/28/17 2000 0     Pain Loc --      Pain Edu? --      Excl. in [place]? --   TMAX(24)@       Latest  Blood pressure 113/78, pulse (!) 110, temperature (!) 97.3 F (36.3 C), temperature source Oral, resp. rate 17, weight 79.8 kg (176 lb), last menstrual period 10/26/2017, SpO2 100 %.    ER  Provider Called:  [facility]    Dr. Ardis Hughs They Recommend medical admission Protonix, CT imaging of the abdomen n.p.o. postmidnight for possible endoscopy in a.m. check for coagulopathy Will see in AM   Hospitalist was called for admission for likely upper GI bleed   Review of Systems:    Pertinent positives include:  fatigue, abdominal pain, nausea, vomiting, diarrhea,  melena, hematemesis  Constitutional:  No weight loss, night sweats, Fevers, chills, weight loss  HEENT:  No headaches, Difficulty swallowing,Tooth/dental problems,Sore throat,  No sneezing, itching, ear ache, nasal congestion, post nasal drip,  Cardio-vascular:  No chest pain, Orthopnea, PND, anasarca, dizziness, palpitations.no Bilateral lower extremity swelling  GI:  No heartburn, indigestion,change in bowel habits, loss of appetite, Resp:  no shortness of breath at rest. No dyspnea on exertion, No excess mucus, no productive cough, No non-productive cough, No coughing up of blood.No change in color of mucus.No wheezing. Skin:  no rash or lesions. No jaundice GU:  no dysuria, change in color of urine, no urgency or frequency. No straining to urinate.  No flank pain.  Musculoskeletal:  No joint pain or no joint swelling. No decreased range of motion. No back pain.  Psych:  No change in mood or affect. No depression or anxiety. No memory loss.  Neuro: no localizing neurological complaints, no tingling, no weakness, no double vision, no gait abnormality, no slurred speech, no confusion  As per HPI otherwise 10 point review of systems negative.   Past Medical History:   Past Medical History:  Diagnosis Date  . Allergy   . Arthritis   . GERD (gastroesophageal reflux disease)   . 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  Colposcopy showed inflammatory changes.  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  Recommend follow-up Pap smear 1 year      Past Surgical History:   Procedure Laterality Date  . DILATION AND CURETTAGE OF UTERUS      Social History:  Ambulatory  independently      reports that she has never smoked. She has never used smokeless tobacco. She reports that she does not drink alcohol or use drugs.     Family History:  Family History  Problem Relation Age of Onset  . Stroke Sister 63  .  Hypertension Sister   . Depression Sister   . Heart attack Sister 20  . Colon cancer Neg Hx   . Colon polyps Neg Hx   . Esophageal cancer Neg Hx   . Rectal cancer Neg Hx   . Stomach cancer Neg Hx     Allergies: No Known Allergies   Prior to Admission medications   Medication Sig Start Date End Date Taking? Authorizing Provider  doxycycline (VIBRAMYCIN) 100 MG capsule Take 1 capsule (100 mg total) by mouth 2 (two) times daily. Patient not taking: Reported on 07/16/2017 07/06/17   Fontaine, Belinda Block, MD   Physical Exam: Blood pressure 113/78, pulse (!) 110, temperature (!) 97.3 F (36.3 C), temperature source Oral, resp. rate 17, weight 79.8 kg (176 lb), last menstrual period 10/26/2017, SpO2 100 %. 1. General:  in No Acute distress  well  -appearing 2. Psychological: Alert and   Oriented 3. Head/ENT:    Dry Mucous Membranes                          Head Non traumatic, neck supple                            Poor Dentition 4. SKIN:  decreased Skin turgor,  Skin clean Dry and intact no rash 5. Heart: Regular rate and rhythm no  Murmur, no Rub or gallop 6. Lungs:  no wheezes or crackles   7. Abdomen: Soft,  non-tender, Non distended  obese  bowel sounds present 8. Lower extremities: no clubbing, cyanosis, or edema 9. Neurologically Grossly intact, moving all 4 extremities equally  10. MSK: Normal range of motion   LABS:     Recent Labs  Lab 10/28/17 2011  WBC 9.1  NEUTROABS 6.3  HGB 7.9*  HCT 24.7*  MCV 89.5  PLT 284   Basic Metabolic Panel: Recent Labs  Lab 10/28/17 2011  NA 139  K 3.2*  CL 110  CO2 21*  GLUCOSE  127*  BUN 25*  CREATININE 0.63  CALCIUM 8.4*      Recent Labs  Lab 10/28/17 2011  AST 15  ALT 13*  ALKPHOS 29*  BILITOT 0.1*  PROT 6.0*  ALBUMIN 3.5   Recent Labs  Lab 10/28/17 2011  LIPASE 25   No results for input(s): AMMONIA in the last 168 hours.    HbA1C: No results for input(s): HGBA1C in the last 72 hours. CBG: No results for input(s): GLUCAP in the last 168 hours.    Urine analysis:    Component Value Date/Time   LABSPEC 1.015 05/02/2017 1528   BILIRUBINUR negative 05/02/2017 1528   BILIRUBINUR n 05/01/2016 1637   KETONESUR negative 05/02/2017 1528   PROTEINUR negative 05/02/2017 1528   PROTEINUR trace 05/01/2016 1637   UROBILINOGEN negative 05/01/2016 1637   NITRITE Negative 05/02/2017 1528   NITRITE positive 05/01/2016 1637   LEUKOCYTESUR Negative 05/02/2017 1528       Cultures: No results found for: SDES, SPECREQUEST, CULT, REPTSTATUS   Radiological Exams on Admission: No results found.  Chart has been reviewed    Assessment/Plan   52 y.o. female with medical history significant of colonic polyps high-grade cervical dysplasia   Admitted for symptomatic anemia in the setting of upper GI bleed  Present on Admission: . Upper GI bleed -  - Glasgow Blatchford score BUN >18.2  , Hg   <13K , systolic   HR >440 ,  melena .  >1 Justifies admission and aggressive management      Modifying risk factors include:   none        -  hemodynamic instability present      -  Admit to stepdown given above    -  ER  Provider spoke to gastroenterology ( [facility]) they will see patient in a.m. appreciate their consult   - serial CBC.    - Monitor for any recurrence,  evidence of hemodynamic instability or significant blood loss  - Transfuse 1 unit given ongoing blood loss and symptomatic anemia - Establish at least 2 PIV and fluid resuscitate   - clear liquids for tonight keep nothing by mouth post midnight,   -  administer Protonix  drip     . GERD  (gastroesophageal reflux disease) on protonix . Symptomatic anemia transfuse 1 unit of PRBCs and follow . Hypokalemia - - will replace and repeat in AM,  check magnesium level and replace as needed  Heavy menses will need to follow-up with her GYN after hospitalization   Other plan as per orders.  DVT prophylaxis:  SCD   Code Status:  FULL CODE  as per patient  I had personally discussed CODE STATUS with patient and family  Family Communication:   Family  at  Bedside  plan of care was discussed with  Daughter,   Disposition Plan:    To home once workup is complete and patient is stable                                              Consults called: [facility]  Admission status:    inpatient      Level of care   stepdown      Garvey Westcott 10/28/2017, 10:39 PM    [facility]  Pager [phone]   after 2 AM please page floor coverage PA If 7AM-7PM, please contact the day team taking care of the patient  [email or web address]  Password [email or web address]

## 2017-10-28 NOTE — ED Provider Notes (Signed)
[facility] DEPT Provider Note   CSN: [id number] Arrival date & time: 10/28/17  7824     History   Chief Complaint No chief complaint on file.   HPI Anita Holland is a 52 y.o. female with a PMHx of GERD, who presents to the ED with complaints of nausea, vomiting, and diarrhea that began 2 days ago on Friday 10/26/17.  Patient states that she ate [employer] on Thursday 10/25/17, that night she felt a little nauseous, but the next day she started having vomiting and diarrhea.  She had one episode of moderate volume red bloody emesis on Friday, none yesterday, but then today had another episode which was coffee-grounds appearing.  She reports 2-3 episodes daily of melanotic diarrhea.  Additionally she states that she started her menstrual cycle on Friday (10/26/2017), when all of these other symptoms started, and the bleeding is heavier than her normal menstrual cycle, going through about 4-5 pads per day, and with passage of clots.  She endorses feeling fatigued and short of breath with exertion.  She also endorses 8/10 intermittent throbbing nonradiating right flank/lateral abdominal pain with no known aggravating factors and somewhat improved with Tylenol.  She had a colonoscopy in January which she says showed polyps, chart review reveals that the colonoscopy was performed by Dr. Hilarie Fredrickson on 07/16/17 and she had several polyps which were removed but she also had mild diverticulosis and small internal hemorrhoid.  Her PCP is at [facility].    She denies fevers, chills, CP, SOB at rest, constipation, obstipation, hematochezia, hematuria, dysuria, vaginal discharge or itching, myalgias, arthralgias, lightheadedness, numbness, tingling, focal weakness, or any other complaints at this time.  She denies recent travel, sick contacts, EtOH use, NSAID use, blood thinner use, recent abx, or prior abdominal surgeries.   The history is provided by the patient and medical  records. No language interpreter was used.    Past Medical History:  Diagnosis Date  . Allergy   . Arthritis   . GERD (gastroesophageal reflux disease)   . 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  Colposcopy showed inflammatory changes.  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  Recommend follow-up Pap smear 1 year    Patient Active Problem List   Diagnosis Date Noted  . Abnormal Pap smear of cervix 05/08/2017  . Impingement syndrome of left shoulder 04/02/2017  . Trigger thumb, left thumb 04/02/2017  . 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 GERD (gastroesophageal reflux disease) 03/27/2016    Past Surgical History:  Procedure Laterality Date  . DILATION AND CURETTAGE OF UTERUS       OB History    Gravida  8   Para  8   Term      Preterm      AB      Living  8     SAB      TAB      Ectopic      Multiple      Live Births               Home Medications    Prior to Admission medications   Medication Sig Start Date End Date Taking? Authorizing Provider  doxycycline (VIBRAMYCIN) 100 MG capsule Take 1 capsule (100 mg total) by mouth 2 (two) times daily. Patient not taking: Reported on 07/16/2017 07/06/17   Fontaine, Belinda Block, MD    Family History Family History  Problem Relation Age of Onset  .  Stroke Sister 75  . Hypertension Sister   . Depression Sister   . Heart attack Sister 70  . Colon cancer Neg Hx   . Colon polyps Neg Hx   . Esophageal cancer Neg Hx   . Rectal cancer Neg Hx   . Stomach cancer Neg Hx     Social History Social History   Tobacco Use  . Smoking status: Never Smoker  . Smokeless tobacco: Never Used  Substance Use Topics  . Alcohol use: No  . Drug use: No     Allergies   Patient has no known allergies.   Review of Systems Review of Systems  Constitutional: Positive for fatigue. Negative for chills and fever.  Respiratory: Negative for shortness of breath  (on exertion, not at rest).   Cardiovascular: Negative for chest pain.  Gastrointestinal: Positive for abdominal pain, blood in stool, diarrhea, nausea and vomiting. Negative for constipation.  Genitourinary: Positive for vaginal bleeding. Negative for dysuria, hematuria, vaginal discharge and vaginal pain.  Musculoskeletal: Negative for arthralgias and myalgias.  Skin: Negative for color change.  Allergic/Immunologic: Negative for immunocompromised state.  Neurological: Negative for weakness, light-headedness and numbness.  Hematological: Does not bruise/bleed easily.  Psychiatric/Behavioral: Negative for confusion.   All other systems reviewed and are negative for acute change except as noted in the HPI.    Physical Exam Updated Vital Signs BP 118/83 (BP Location: Left Arm)   Pulse (!) 119   Temp 98.8 F (37.1 C) (Oral)   Resp 18   Wt 79.8 kg (176 lb)   LMP 10/26/2017   SpO2 100%   BMI 31.18 kg/m   Physical Exam  Constitutional: She is oriented to person, place, and time. Vital signs are normal. She appears well-developed and well-nourished.  Non-toxic appearance. No distress.  Afebrile, nontoxic, NAD initially but once we started discussing the anemia/GI bleed and needing to be transfused and admitted, she became very tearful and overwhelmed by the whole situation  HENT:  Head: Normocephalic and atraumatic.  Mouth/Throat: Oropharynx is clear and moist and mucous membranes are normal.  Eyes: Conjunctivae and EOM are normal. Right eye exhibits no discharge. Left eye exhibits no discharge.  Conjunctival pallor  Neck: Normal range of motion. Neck supple.  Cardiovascular: Regular rhythm, normal heart sounds and intact distal pulses. Tachycardia present. Exam reveals no gallop and no friction rub.  No murmur heard. Tachycardic in the 110s  Pulmonary/Chest: Effort normal and breath sounds normal. No respiratory distress. She has no decreased breath sounds. She has no wheezes. She  has no rhonchi. She has no rales.  Abdominal: Soft. Normal appearance and bowel sounds are normal. She exhibits no distension. There is tenderness in the right upper quadrant and right lower quadrant. There is no rigidity, no rebound, no guarding, no CVA tenderness, no tenderness at McBurney's point and negative Murphy's sign.  Soft, nondistended, +BS throughout, with mild-to-moderate RUQ and RLQ TTP, no r/g/r, neg murphy's, no focal mcburney's point tenderness, no CVA TTP   Genitourinary:  Genitourinary Comments: Pt declined pelvic/rectal at this time  Musculoskeletal: Normal range of motion.  Neurological: She is alert and oriented to person, place, and time. She has normal strength. No sensory deficit.  Skin: Skin is warm, dry and intact. No rash noted.  Psychiatric: She has a normal mood and affect.  Nursing note and vitals reviewed.    ED Treatments / Results  Labs (all labs ordered are listed, but only abnormal results are displayed) Labs Reviewed  COMPREHENSIVE METABOLIC  PANEL - Abnormal; Notable for the following components:      Result Value   Potassium 3.2 (*)    CO2 21 (*)    Glucose, Bld 127 (*)    BUN 25 (*)    Calcium 8.4 (*)    Total Protein 6.0 (*)    ALT 13 (*)    Alkaline Phosphatase 29 (*)    Total Bilirubin 0.1 (*)    All other components within normal limits  CBC WITH DIFFERENTIAL/PLATELET - Abnormal; Notable for the following components:   RBC 2.76 (*)    Hemoglobin 7.9 (*)    HCT 24.7 (*)    All other components within normal limits  LIPASE, BLOOD  URINALYSIS, ROUTINE W REFLEX MICROSCOPIC  VITAMIN B12  FOLATE  IRON AND TIBC  FERRITIN  RETICULOCYTES  PROTIME-INR  FIBRINOGEN  APTT  I-STAT BETA HCG BLOOD, ED ([facility], [facility], [facility] ONLY)  TYPE AND SCREEN  ABO/RH  PREPARE RBC (CROSSMATCH)    EKG None  Radiology No results found.   Colonscopy 07/16/17: Impression:        - One 3 mm polyp in the transverse colon, removed                            with a  cold snare. Resected and retrieved.                           - One 9 mm polyp in the transverse colon, removed                            with a hot snare. Resected and retrieved.                           - One 5 mm polyp in the rectum, removed with a cold                            snare. Resected and retrieved.                           - Moderate diverticulosis in the sigmoid colon and                            in the descending colon.                           - Small internal hemorrhoids.    Procedures Procedures (including critical care time)  CRITICAL CARE Performed by: Reece Agar   Total critical care time: 45 minutes  Critical care time was exclusive of separately billable procedures and treating other patients.  Critical care was necessary to treat or prevent imminent or life-threatening deterioration.  Critical care was time spent personally by me on the following activities: development of treatment plan with patient and/or surrogate as well as nursing, discussions with consultants, evaluation of patient's response to treatment, examination of patient, obtaining history from patient or surrogate, ordering and performing treatments and interventions, ordering and review of laboratory studies, ordering and review of radiographic studies, pulse oximetry and re-evaluation of patient's condition.   Medications Ordered in ED Medications  0.9 %  sodium chloride infusion (  has no administration in time range)  sodium chloride 0.9 % bolus 1,000 mL (1,000 mLs Intravenous New Bag/Given 10/28/17 2102)    And  0.9 %  sodium chloride infusion (has no administration in time range)  ondansetron (ZOFRAN) injection 4 mg (has no administration in time range)  pantoprazole (PROTONIX) injection 40 mg (40 mg Intravenous Given 10/28/17 2118)     Initial Impression / Assessment and Plan / ED Course  I have reviewed the triage vital signs and the nursing notes.  Pertinent labs &  imaging results that were available during my care of the patient were reviewed by me and considered in my medical decision making (see chart for details).     52 y.o. female here with n/v/d x2 days, melenotic stools. Had one episode of red hematemesis but then today had coffee ground emesis. Also having menstrual cycle that's heavier than normal and with clots. Feels fatigued and SOB with exertion. On exam, mild to moderate RUQ and RLQ TTP, nonperitoneal, mildly tachycardic in the 110s, afebrile. Conjunctival pallor noted. Labs ordered in triage. While I was evaluating her, her CBC came back and shows Hgb 7.9 which is acute (no other abnormalities on CBC w/diff); obtaining anemia panel and will proceed with transfusion for ABLA and GI bleed, pt explained the r/b/a and she wishes to proceed. Will start protonix and consult GI. She politely defers pelvic/rectal exam right now due to being overwhelmed with everything happening so quickly, and since GI will likely be scoping her during this admission. Will await labs, get CT abd/pelv, and consult GI, she will need admission. She declines wanting pain meds, will give fluids and zofran. Discussed case with my attending Dr. Sherry Ruffing who agrees with plan.   9:00 PM CMP with mildly low K 3.2 but will hold off on repleting for now; mildly elevated BUN 25 with normal Cr which makes sense for GI bleed, otherwise no significant findings on remainder of CMP. Lipase WNL. BetaHCG neg. U/A and CT not yet done. Dr. Ardis Hughs of GI returning page, agrees with plan for transfusion/admission, will see her in the morning, agrees that holding off on the rectal exam for now is reasonable. Requests hospitalist admission, and also wants coags added on just given this acute bleeding from two places (although it could be coincidental). Will add-on fibrinogen, PT/INR, and APTT. Will proceed with admission.   9:24 PM Dr. Roel Cluck of [facility] returning page and will admit. Holding orders to be  placed by admitting team. Please see their notes for further documentation of care. I appreciate their help with this pleasant pt's care. Pt stable at time of admission.    Final Clinical Impressions(s) / ED Diagnoses   Final diagnoses:  Acute GI bleeding  Nausea vomiting and diarrhea  Melena  Hematemesis with nausea  Acute blood loss anemia  Vaginal bleeding  Right lateral abdominal pain  Symptomatic anemia  Hypokalemia    ED Discharge Orders    [address] [place], [place] 10/28/17 2125    Tegeler, Gwenyth Allegra, MD 10/29/17 [phone]

## 2017-10-28 NOTE — ED Notes (Signed)
Patient has BSC at bedside and will call staff when able to give a urine specimen.

## 2017-10-28 NOTE — ED Notes (Signed)
No respiratory or acute distress noted alert and oriented x 3 call light in reach no reaction to medication noted. 

## 2017-10-28 NOTE — ED Triage Notes (Signed)
Pt reports starting N/V/D hot flashes starting two days ago. Pt unable to keep anything down since Friday.

## 2017-10-28 NOTE — ED Notes (Signed)
No respiratory or acute distress noted alert and oriented x 3 call light in reach no reaction to medication noted family at bedside. 

## 2017-10-29 DIAGNOSIS — K920 Hematemesis: Secondary | ICD-10-CM

## 2017-10-29 DIAGNOSIS — K922 Gastrointestinal hemorrhage, unspecified: Secondary | ICD-10-CM

## 2017-10-29 DIAGNOSIS — K921 Melena: Secondary | ICD-10-CM

## 2017-10-29 DIAGNOSIS — R710 Precipitous drop in hematocrit: Secondary | ICD-10-CM

## 2017-10-29 DIAGNOSIS — D509 Iron deficiency anemia, unspecified: Secondary | ICD-10-CM

## 2017-10-29 LAB — CBC
HCT: 23.3 % — ABNORMAL LOW (ref 36.0–46.0)
HCT: 29.7 % — ABNORMAL LOW (ref 36.0–46.0)
HEMATOCRIT: 26.1 % — AB (ref 36.0–46.0)
HEMATOCRIT: 29.7 % — AB (ref 36.0–46.0)
Hemoglobin: 7.6 g/dL — ABNORMAL LOW (ref 12.0–15.0)
Hemoglobin: 8.3 g/dL — ABNORMAL LOW (ref 12.0–15.0)
Hemoglobin: 9.8 g/dL — ABNORMAL LOW (ref 12.0–15.0)
Hemoglobin: 9.9 g/dL — ABNORMAL LOW (ref 12.0–15.0)
MCH: 27.6 pg (ref 26.0–34.0)
MCH: 27.3 pg (ref 26.0–34.0)
MCH: 28.3 pg (ref 26.0–34.0)
MCH: 28.6 pg (ref 26.0–34.0)
MCHC: 32.6 g/dL (ref 30.0–36.0)
MCHC: 31.8 g/dL (ref 30.0–36.0)
MCHC: 33 g/dL (ref 30.0–36.0)
MCHC: 33.3 g/dL (ref 30.0–36.0)
MCV: 84.7 fL (ref 78.0–100.0)
MCV: 85.9 fL (ref 78.0–100.0)
MCV: 85.8 fL (ref 78.0–100.0)
MCV: 85.8 fL (ref 78.0–100.0)
PLATELETS: 256 10*3/uL (ref 150–400)
PLATELETS: 287 10*3/uL (ref 150–400)
Platelets: 261 10*3/uL (ref 150–400)
Platelets: 264 10*3/uL (ref 150–400)
RBC: 2.75 MIL/uL — AB (ref 3.87–5.11)
RBC: 3.04 MIL/uL — AB (ref 3.87–5.11)
RBC: 3.46 MIL/uL — ABNORMAL LOW (ref 3.87–5.11)
RBC: 3.46 MIL/uL — ABNORMAL LOW (ref 3.87–5.11)
RDW: 18.3 % — ABNORMAL HIGH (ref 11.5–15.5)
RDW: 18.5 % — ABNORMAL HIGH (ref 11.5–15.5)
RDW: 17.9 % — AB (ref 11.5–15.5)
RDW: 17.8 % — AB (ref 11.5–15.5)
WBC: 6.5 10*3/uL (ref 4.0–10.5)
WBC: 5.9 10*3/uL (ref 4.0–10.5)
WBC: 6.7 10*3/uL (ref 4.0–10.5)
WBC: 6.8 10*3/uL (ref 4.0–10.5)

## 2017-10-29 LAB — FOLATE: FOLATE: 7.9 ng/mL (ref >5.9)

## 2017-10-29 LAB — PROTIME-INR
INR: 1.13
Prothrombin Time: 14.4 seconds (ref 11.4–15.2)

## 2017-10-29 LAB — MAGNESIUM
MAGNESIUM: 1.6 mg/dL — AB (ref 1.7–2.4)
Magnesium: 1.6 mg/dL — ABNORMAL LOW (ref 1.7–2.4)

## 2017-10-29 LAB — COMPREHENSIVE METABOLIC PANEL
ALT: 13 U/L — ABNORMAL LOW (ref 14–54)
AST: 14 U/L — ABNORMAL LOW (ref 15–41)
Albumin: 2.9 g/dL — ABNORMAL LOW (ref 3.5–5.0)
Alkaline Phosphatase: 25 U/L — ABNORMAL LOW (ref 38–126)
Anion gap: 8 (ref 5–15)
BUN: 13 mg/dL (ref 6–20)
CHLORIDE: 113 mmol/L — AB (ref 101–111)
CO2: 21 mmol/L — AB (ref 22–32)
Calcium: 8 mg/dL — ABNORMAL LOW (ref 8.9–10.3)
Creatinine, Ser: 0.53 mg/dL (ref 0.44–1.00)
Glucose, Bld: 103 mg/dL — ABNORMAL HIGH (ref 65–99)
Potassium: 3.6 mmol/L (ref 3.5–5.1)
SODIUM: 142 mmol/L (ref 135–145)
Total Bilirubin: 0.5 mg/dL (ref 0.3–1.2)
Total Protein: 5 g/dL — ABNORMAL LOW (ref 6.5–8.1)

## 2017-10-29 LAB — FERRITIN: Ferritin: 5 ng/mL — ABNORMAL LOW (ref 11–307)

## 2017-10-29 LAB — TSH: TSH: 2.514 u[IU]/mL (ref 0.350–4.500)

## 2017-10-29 LAB — IRON AND TIBC
Iron: 90 ug/dL (ref 28–170)
Saturation Ratios: 24 % (ref 10.4–31.8)
TIBC: 381 ug/dL (ref 250–450)
UIBC: 291 ug/dL

## 2017-10-29 LAB — HIV ANTIBODY (ROUTINE TESTING W REFLEX): HIV SCREEN 4TH GENERATION: NONREACTIVE

## 2017-10-29 LAB — FIBRINOGEN: FIBRINOGEN: 245 mg/dL (ref 210–475)

## 2017-10-29 LAB — PHOSPHORUS: Phosphorus: 2.7 mg/dL (ref 2.5–4.6)

## 2017-10-29 LAB — APTT: aPTT: 30 seconds (ref 24–36)

## 2017-10-29 LAB — VITAMIN B12: VITAMIN B 12: 426 pg/mL (ref 180–914)

## 2017-10-29 MED ORDER — ACETAMINOPHEN 650 MG RE SUPP: 650.0000 mg | Freq: Four times a day (QID) | RECTAL | Status: DC | PRN

## 2017-10-29 MED ORDER — ONDANSETRON HCL 4 MG/2ML IJ SOLN
4.0000 mg | Freq: Four times a day (QID) | INTRAMUSCULAR | Status: DC | PRN
  Administered 2017-10-31: 4 mg via INTRAVENOUS
  Filled 2017-10-29: qty 2

## 2017-10-29 MED ORDER — SODIUM CHLORIDE 0.9 % IV SOLN: INTRAVENOUS | Status: DC

## 2017-10-29 MED ORDER — ACETAMINOPHEN 325 MG PO TABS
650.0000 mg | ORAL_TABLET | Freq: Four times a day (QID) | ORAL | Status: DC | PRN
  Administered 2017-10-30 – 2017-10-31 (×2): 650 mg via ORAL
  Filled 2017-10-29 (×2): qty 2

## 2017-10-29 MED ORDER — ONDANSETRON HCL 4 MG PO TABS: 4.0000 mg | ORAL_TABLET | Freq: Four times a day (QID) | ORAL | Status: DC | PRN

## 2017-10-29 MED ORDER — MAGNESIUM SULFATE 2 GM/50ML IV SOLN
2.0000 g | Freq: Once | INTRAVENOUS | Status: AC
Start: 2017-10-29 — End: 2017-10-29
  Administered 2017-10-29: 2 g via INTRAVENOUS
  Filled 2017-10-29: qty 50

## 2017-10-29 NOTE — ED Notes (Signed)
Assisted pt to bedside commode and back to bed alert and oriented x 3 no pain voiced no reaction to medication noted call light in reach.

## 2017-10-29 NOTE — H&P (View-Only) (Signed)
Referring Provider: [facility]  Primary Care Physician:  Girtha Rm, NP-C Primary Gastroenterologist: Zenovia Jarred,  MD  Reason for Consultation:   GI bleed    ASSESSMENT AND PLAN:    17. 52 year old female with hematemesis / black stool over the weekend associated with acute drop in hemoglobin from 13 in August 2018 to 7.6. CT scan with contrast unrevealing of GI pathology. Rule out PUD, Mallory Weiss tear vrs other etiologies.  -Patient will need an upper endoscopy for evaluation of upper GI bleeding.  This would likely be done in the a.m.. The risks and benefits of EGD were discussed and the patient agrees to proceed.  -Blood transfusion has already been ordered - monitor CBC -Agree with PPI drip  -keep on clears, n.p.o. after midnight   2. Iron deficiency.  Presumably the anemia is acute but she is iron deficient which could be secondary to menses. Not taking NSAIDs and up to date on screening colonoscopy (Jan 2019).   3. Non-obstructing right nephrolithiasis.  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 female known to Dr. Hilarie Fredrickson who performed her screening colonoscopy in January of this year.  Several small polyps removed, pathology compatible with both tubular adenomas as well as hyperplastic polyps.  Patient presented to the emergency department last evening evaluation of hematemesis.  Hematemesis started on Friday at work.  Patient had several episodes of dark red bloody emesis, this was eventually followed by passage of loose black stools.  No significant abdominal pain was associated with vomiting.  There was blood present for the first time that she vomited on Friday.  No NSAID use.  She takes OTC Tylenol pain reliever.  In the emergency department patient has been mildly tachycardic, no hypo- tension.  She has had a significant drop in her hemoglobin from a baseline of 13 down to 7.6 today.  Initial BUN was elevated  slightly at 25. she has a ferritin of 5 but still menstruates.    Anita Holland had some mild dyspnea yesterday.  No chest pain. No urinary sx. No fevers. She is currently on menstrual cycle as having cramps.    Past Medical History:  Diagnosis Date  . Allergy   . Arthritis   . GERD (gastroesophageal reflux disease)   . 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  Colposcopy showed inflammatory changes.  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  Recommend follow-up Pap smear 1 year    Past Surgical History:  Procedure Laterality Date  . DILATION AND CURETTAGE OF UTERUS      Prior to Admission medications   Medication Sig Start Date End Date Taking? Authorizing Provider  diphenhydramine-acetaminophen (TYLENOL PM) 25-500 MG TABS tablet Take 2 tablets by mouth at bedtime as needed (sleep, pain).   Yes [provider]  doxycycline (VIBRAMYCIN) 100 MG capsule Take 1 capsule (100 mg total) by mouth 2 (two) times daily. Patient not taking: Reported on 07/16/2017 07/06/17   Fontaine, Belinda Block, MD    Current Facility-Administered Medications  Medication Dose Route Frequency Provider Last Rate Last Dose  . 0.9 %  sodium chloride infusion   Intravenous Continuous Toy Baker, MD 125 mL/hr at 10/28/17 2158    . 0.9 %  sodium chloride infusion   Intravenous Continuous Toy Baker, MD   Stopped at 10/29/17 5053  . acetaminophen (TYLENOL) tablet 650 mg  650 mg Oral Q6H PRN Toy Baker, MD  Or  . acetaminophen (TYLENOL) suppository 650 mg  650 mg Rectal Q6H PRN Doutova, Anastassia, MD      . ondansetron (ZOFRAN) injection 4 mg  4 mg Intravenous Once Doutova, Anastassia, MD      . ondansetron (ZOFRAN) tablet 4 mg  4 mg Oral Q6H PRN Doutova, Anastassia, MD       Or  . ondansetron (ZOFRAN) injection 4 mg  4 mg Intravenous Q6H PRN Doutova, Anastassia, MD      . pantoprazole (PROTONIX) 80 mg in sodium chloride 0.9 % 250 mL  (0.32 mg/mL) infusion  8 mg/hr Intravenous Continuous Doutova, Anastassia, MD 25 mL/hr at 10/28/17 2326 8 mg/hr at 10/28/17 2326  . [START ON 11/01/2017] pantoprazole (PROTONIX) injection 40 mg  40 mg Intravenous Q12H Toy Baker, MD       Current Outpatient Medications  Medication Sig Dispense Refill  . diphenhydramine-acetaminophen (TYLENOL PM) 25-500 MG TABS tablet Take 2 tablets by mouth at bedtime as needed (sleep, pain).    Marland Kitchen doxycycline (VIBRAMYCIN) 100 MG capsule Take 1 capsule (100 mg total) by mouth 2 (two) times daily. (Patient not taking: Reported on 07/16/2017) 14 capsule 0    Allergies as of 10/28/2017  . (No Known Allergies)    Family History  Problem Relation Age of Onset  . Stroke Sister 48  . Hypertension Sister   . Depression Sister   . Heart attack Sister 30  . Colon cancer Neg Hx   . Colon polyps Neg Hx   . Esophageal cancer Neg Hx   . Rectal cancer Neg Hx   . Stomach cancer Neg Hx     Social History   Socioeconomic History  . Marital status: Single    Spouse name: Not on file  . Number of children: Not on file  . Years of education: Not on file  . Highest education level: Not on file  Occupational History  . Not on file  Social Needs  . Financial resource strain: Not on file  . Food insecurity:    Worry: Not on file    Inability: Not on file  . Transportation needs:    Medical: Not on file    Non-medical: Not on file  Tobacco Use  . Smoking status: Never Smoker  . Smokeless tobacco: Never Used  Substance and Sexual Activity  . Alcohol use: No  . Drug use: No  . Sexual activity: Yes    Birth control/protection: Condom    Comment: 1st intercourse 15yo-5 partners  Lifestyle  . Physical activity:    Days per week: Not on file    Minutes per session: Not on file  . Stress: Not on file  Relationships  . Social connections:    Talks on phone: Not on file    Gets together: Not on file    Attends religious service: Not on file    Active  member of club or organization: Not on file    Attends meetings of clubs or organizations: Not on file    Relationship status: Not on file  . Intimate partner violence:    Fear of current or ex partner: Not on file    Emotionally abused: Not on file    Physically abused: Not on file    Forced sexual activity: Not on file  Other Topics Concern  . Not on file  Social History Narrative  . Not on file    Review of Systems: All systems reviewed and negative except where noted in HPI.  Physical Exam: Vital signs in last 24 hours: Temp:  [97.3 F (36.3 C)-99 F (37.2 C)] 99 F (37.2 C) (05/06 0921) Pulse Rate:  [96-119] 97 (05/06 0916) Resp:  [14-25] 24 (05/06 0916) BP: (108-140)/(69-107) 127/83 (05/06 0916) SpO2:  [95 %-100 %] 100 % (05/06 0916) Weight:  [176 lb (79.8 kg)] 176 lb (79.8 kg) (05/05 1925)   General:   Alert, well-developed, black female in NAD Psych:  Pleasant, cooperative. Normal mood and affect. Eyes:  Pupils equal, sclera clear, no icterus.   Conjunctiva pink. Ears:  Normal auditory acuity. Nose:  No deformity, discharge,  or lesions. Neck:  Supple; no masses Lungs:  Clear throughout to auscultation.   No wheezes, crackles, or rhonchi.  Heart:  Regular rate and rhythm; no murmurs, no edema Abdomen:  Soft, non-distended, nontender, BS active, no palp mass    Rectal:  Deferred  Msk:  Symmetrical without gross deformities. . Neurologic:  Alert and  oriented x4;  grossly normal neurologically. Skin:  Intact without significant lesions or rashes..   Intake/Output from previous day: 05/05 0701 - 05/06 0700 In: 945 [Blood:945] Out: -  Intake/Output this shift: No intake/output data recorded.  Lab Results: Recent Labs    10/28/17 2011 10/29/17 0544  WBC 9.1 6.5  HGB 7.9* 7.6*  HCT 24.7* 23.3*  PLT 319 261   BMET Recent Labs    10/28/17 2011 10/29/17 0544  NA 139 142  K 3.2* 3.6  CL 110 113*  CO2 21* 21*  GLUCOSE 127* 103*  BUN 25* 13    CREATININE 0.63 0.53  CALCIUM 8.4* 8.0*   LFT Recent Labs    10/29/17 0544  PROT 5.0*  ALBUMIN 2.9*  AST 14*  ALT 13*  ALKPHOS 25*  BILITOT 0.5   PT/INR Recent Labs    10/29/17 0544  LABPROT 14.4  INR 1.13    Studies/Results: Ct Abdomen Pelvis W Contrast  Result Date: 10/28/2017 CLINICAL DATA:  Nausea, vomiting. EXAM: CT ABDOMEN AND PELVIS WITH CONTRAST TECHNIQUE: Multidetector CT imaging of the abdomen and pelvis was performed using the standard protocol following bolus administration of intravenous contrast. CONTRAST:  100 mL ISOVUE-300 IOPAMIDOL (ISOVUE-300) INJECTION 61% COMPARISON:  None. FINDINGS: Lower chest: No acute abnormality. Hepatobiliary: No focal liver abnormality is seen. No gallstones, gallbladder wall thickening, or biliary dilatation. Pancreas: Unremarkable. No pancreatic ductal dilatation or surrounding inflammatory changes. Spleen: Normal in size without focal abnormality. Adrenals/Urinary Tract: Adrenal glands appear normal. Left kidney and ureter are unremarkable. Nonobstructive right nephrolithiasis is noted. Mild right hydronephrosis is noted without obstructing calculus or ureteral dilatation. Urinary bladder is unremarkable. Stomach/Bowel: Stomach is within normal limits. Appendix appears normal. No evidence of bowel wall thickening, distention, or inflammatory changes. Vascular/Lymphatic: No significant vascular findings are present. No enlarged abdominal or pelvic lymph nodes. Reproductive: 5.6 cm uterine fibroid is noted. No adnexal abnormality is noted. Other: No abdominal wall hernia or abnormality. No abdominopelvic ascites. Musculoskeletal: No acute or significant osseous findings. IMPRESSION: Nonobstructive right nephrolithiasis is noted. 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 5.6 cm uterine fibroid is noted. Electronically Signed   By: Marijo Conception, M.D.    On: 10/28/2017 23:42     Tye Savoy, NP-C @  10/29/2017, 9:56 AM  Pager number [phone]   Attending physician's note   I have taken a history, examined the patient and reviewed the chart. I agree with the Advanced Practitioner's note, impression and recommendations.  8 Female presented with melena and hematemesis, acute drop in hemoglobin from 13 to 7 and also has iron deficiency .  Reviewed recent colonoscopy's findings.  Plan for EGD to evaluate source of possible upper GI bleed The risks and benefits as well as alternatives of endoscopic procedure(s) have been discussed and reviewed. All questions answered. The patient agrees to proceed. Monitor hemoglobin every 12 hours and transfuse as needed with goal greater than 7 Continue PPI Clear liquids and n.p.o. after midnight  K. Denzil Magnuson , MD [phone]

## 2017-10-29 NOTE — Progress Notes (Signed)
PROGRESS NOTE [facility]   Gurpreet Mariani   MWU:[id number] DOB: Nov 06, 1965  DOA: 10/28/2017 PCP: Girtha Rm, NP-C   Brief Narrative:  Anita Holland 52 year old female with no significant medical history presented to the emergency department complaining of nausea, bloody emesis and black stools.  Patient report that 3 days ago has been vomiting after eating [place] food.  Emesis is nonbloody.  Associated symptoms include diarrhea with dark black stools.  Upon ED evaluation was found to be 7.9, BUN 25, creatinine stable, tachycardic and blood pressure stable.  Patient was admitted with working diagnosis of upper GI bleed.  Subjective: Patient seen and examined, she denies any abdominal pain, nausea or vomiting.  No bowel movement this morning.  Afebrile.  Received 1 unit of PRBC overnight, however hemoglobin lower than initial.  Assessment & Plan: Symptomatic anemia/upper GI bleed - hematemesis Patient is status post 1 unit of PRBC, however hemoglobin did not as suspected.  Will transfuse another unit of PRBC.  Continue PPI drip.  GI recommendations appreciated, she will be scheduled for EGD in a.m.  Up today colonoscopy (January 2019).  No history of NSAID use.  Monitor CBC every 12.  GERD Patient on Protonix ggt  With endoscopy  Hypokalemia/hypomagnesemia Replete Check BMP and magnesium in the a.m.  DVT prophylaxis: SCDs Code Status: Full code Family Communication: None at bedside Disposition Plan: Home after GI work-up is complete hemoglobin stable  Consultants:   GI  Procedures:   None  Antimicrobials:  None   Objective: Vitals:   10/29/17 1047 10/29/17 1100 10/29/17 1102 10/29/17 1130  BP: (!) 103/59 122/86 122/86 (!) 124/91  Pulse: 95 97 90 95  Resp: (!) 22 15 (!) 23 15  Temp: 98.9 F (37.2 C)  98.9 F (37.2 C)   TempSrc: Oral  Oral   SpO2:  100% 100% 100%  Weight:        Intake/Output Summary (Last 24 hours) at 10/29/2017 1155 Last data filed  at 10/29/2017 1051 Gross per 24 hour  Intake 3260 ml  Output -  Net 3260 ml   Filed Weights   10/28/17 1925  Weight: 79.8 kg (176 lb)    Examination:  General exam: Appears calm and comfortable  HEENT: OP moist and clear, conjunctiva pale Respiratory system: Clear to auscultation. No wheezes,crackle or rhonchi Cardiovascular system: S1 & S2 heard, RRR. No JVD, murmurs, rubs or gallops Gastrointestinal system: Abdomen is nondistended, soft and nontender.  Central nervous system: Alert and oriented. No focal neurological deficits. Extremities: No pedal edema. Symmetric.  Skin: No rashes.  Psychiatry: Mood & affect appropriate.    Data Reviewed: I have personally reviewed following labs and imaging studies  CBC: Recent Labs  Lab 10/28/17 2011 10/29/17 0544  WBC 9.1 6.5  NEUTROABS 6.3  --   HGB 7.9* 7.6*  HCT 24.7* 23.3*  MCV 89.5 84.7  PLT 319 725   Basic Metabolic Panel: Recent Labs  Lab 10/28/17 2011 10/29/17 0544  NA 139 142  K 3.2* 3.6  CL 110 113*  CO2 21* 21*  GLUCOSE 127* 103*  BUN 25* 13  CREATININE 0.63 0.53  CALCIUM 8.4* 8.0*  MG 1.6* 1.6*  PHOS  --  2.7   GFR: Estimated Creatinine Clearance: 84.2 mL/min (by C-G formula based on SCr of 0.53 mg/dL). Liver Function Tests: Recent Labs  Lab 10/28/17 2011 10/29/17 0544  AST 15 14*  ALT 13* 13*  ALKPHOS 29* 25*  BILITOT 0.1* 0.5  PROT 6.0* 5.0*  ALBUMIN 3.5 2.9*   Recent Labs  Lab 10/28/17 2011  LIPASE 25   No results for input(s): AMMONIA in the last 168 hours. Coagulation Profile: Recent Labs  Lab 10/29/17 0544  INR 1.13   Cardiac Enzymes: No results for input(s): CKTOTAL, CKMB, CKMBINDEX, TROPONINI in the last 168 hours. BNP (last 3 results) No results for input(s): PROBNP in the last 8760 hours. HbA1C: No results for input(s): HGBA1C in the last 72 hours. CBG: No results for input(s): GLUCAP in the last 168 hours. Lipid Profile: No results for input(s): CHOL, HDL, LDLCALC,  TRIG, CHOLHDL, LDLDIRECT in the last 72 hours. Thyroid Function Tests: Recent Labs    10/29/17 0544  TSH 2.514   Anemia Panel: Recent Labs    10/28/17 2100  VITAMINB12 426  FOLATE 7.9  FERRITIN 5*  TIBC 381  IRON 90  RETICCTPCT 4.0*   Sepsis Labs: No results for input(s): PROCALCITON, LATICACIDVEN in the last 168 hours.  No results found for this or any previous visit (from the past 240 hour(s)).    Radiology Studies: Ct Abdomen Pelvis W Contrast  Result Date: 10/28/2017 CLINICAL DATA:  Nausea, vomiting. EXAM: CT ABDOMEN AND PELVIS WITH CONTRAST TECHNIQUE: Multidetector CT imaging of the abdomen and pelvis was performed using the standard protocol following bolus administration of intravenous contrast. CONTRAST:  100 mL ISOVUE-300 IOPAMIDOL (ISOVUE-300) INJECTION 61% COMPARISON:  None. FINDINGS: Lower chest: No acute abnormality. Hepatobiliary: No focal liver abnormality is seen. No gallstones, gallbladder wall thickening, or biliary dilatation. Pancreas: Unremarkable. No pancreatic ductal dilatation or surrounding inflammatory changes. Spleen: Normal in size without focal abnormality. Adrenals/Urinary Tract: Adrenal glands appear normal. Left kidney and ureter are unremarkable. Nonobstructive right nephrolithiasis is noted. Mild right hydronephrosis is noted without obstructing calculus or ureteral dilatation. Urinary bladder is unremarkable. Stomach/Bowel: Stomach is within normal limits. Appendix appears normal. No evidence of bowel wall thickening, distention, or inflammatory changes. Vascular/Lymphatic: No significant vascular findings are present. No enlarged abdominal or pelvic lymph nodes. Reproductive: 5.6 cm uterine fibroid is noted. No adnexal abnormality is noted. Other: No abdominal wall hernia or abnormality. No abdominopelvic ascites. Musculoskeletal: No acute or significant osseous findings. IMPRESSION: Nonobstructive right nephrolithiasis is noted. 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 5.6 cm uterine fibroid is noted. Electronically Signed   By: Marijo Conception, M.D.   On: 10/28/2017 23:42      Scheduled Meds: . ondansetron (ZOFRAN) IV  4 mg Intravenous Once  . [START ON 11/01/2017] pantoprazole  40 mg Intravenous Q12H   Continuous Infusions: . sodium chloride 125 mL/hr at 10/29/17 1137  . sodium chloride Stopped (10/29/17 9163)  . pantoprozole (PROTONIX) infusion Stopped (10/29/17 1051)     LOS: 1 day    Time spent: Total of 35 minutes spent with pt, greater than 50% of which was spent in discussion of  treatment, counseling and coordination of care  Chipper Oman, MD Pager: Text Page via [email or web address]   If 7PM-7AM, please contact night-coverage [email or web address] 10/29/2017, 11:55 AM   Note - This record has been created using [employer]. Chart creation errors have been sought, but may not always have been located. Such creation errors do not reflect on the standard of medical care.

## 2017-10-29 NOTE — ED Notes (Signed)
Blood consent form has been signed electronically and witnessed by Aaron Edelman, RN (night shift). Patient has urinated twice on bedside commode and 1 bowel movement since 0700. Patient also on menstrual cycle and has vaginal bleeding. Unable to tell if patient has bloody stools. Patient is A/Ox4, comfortable, and able to speak in complete sentences. Waiting for pharmacy to call to pick up unit of blood.

## 2017-10-29 NOTE — ED Notes (Signed)
Pt given chicken broth and water.

## 2017-10-29 NOTE — ED Notes (Signed)
Pt ambulated to [facility] in room. Tolerated well.

## 2017-10-29 NOTE — ED Notes (Signed)
Pt ambulated to [facility] in room. Tolerated well.

## 2017-10-29 NOTE — ED Notes (Signed)
Sent Amion text to Dr Kennon Holter covering for admitted hospitalist patients to inform of drop in H&H even with 1 unit of blood given asked to look at morning labs.

## 2017-10-29 NOTE — ED Notes (Signed)
ED TO INPATIENT HANDOFF REPORT  Name/Age/Gender Anita Holland 52 y.o. female  Code Status    Code Status Orders  (From admission, onward)        Start     Ordered   10/29/17 0508  Full code  Continuous     10/29/17 0507    Code Status History    This patient has a current code status but no historical code status.      Home/SNF/Other Home  Chief Complaint emesis and diarrhea; weakness  Level of Care/Admitting Diagnosis ED Disposition    ED Disposition Condition [facility]: [place] [[id number]]  Level of Care: Telemetry [5]  Admit to tele based on following criteria: Complex arrhythmia (Bradycardia/Tachycardia)  Diagnosis: GI bleed [678938]  Admitting Physician: Patrecia Pour, EDWIN [[id number]]  Attending Physician: Patrecia Pour, EDWIN [[id number]]  Estimated length of stay: past midnight tomorrow  Certification:: I certify this patient will need inpatient services for at least 2 midnights  PT Class (Do Not Modify): Inpatient [101]  PT Acc Code (Do Not Modify): Private [1]       Medical History Past Medical History:  Diagnosis Date  . Allergy   . Arthritis   . GERD (gastroesophageal reflux disease)   . 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  Colposcopy showed inflammatory changes.  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  Recommend follow-up Pap smear 1 year    Allergies No Known Allergies  IV Location/Drains/Wounds Patient Lines/Drains/Airways Status   Active Line/Drains/Airways    Name:   Placement date:   Placement time:   Site:   Days:   Peripheral IV 10/28/17 Right Forearm   10/28/17    2106    Forearm   1   Peripheral IV 10/28/17 Left Forearm   10/28/17    2327    Forearm   1          Labs/Imaging Results for orders placed or performed during the hospital encounter of 10/28/17 (from the past 48 hour(s))  Lipase, blood     Status: None   Collection Time:  10/28/17  8:11 PM  Result Value Ref Range   Lipase 25 11 - 51 U/L    Comment: Performed at [facility], [place] [address], [place], [place] [address]  Comprehensive metabolic panel     Status: Abnormal   Collection Time: 10/28/17  8:11 PM  Result Value Ref Range   Sodium 139 135 - 145 mmol/L   Potassium 3.2 (L) 3.5 - 5.1 mmol/L   Chloride 110 101 - 111 mmol/L   CO2 21 (L) 22 - 32 mmol/L   Glucose, Bld 127 (H) 65 - 99 mg/dL   BUN 25 (H) 6 - 20 mg/dL   Creatinine, Ser 0.63 0.44 - 1.00 mg/dL   Calcium 8.4 (L) 8.9 - 10.3 mg/dL   Total Protein 6.0 (L) 6.5 - 8.1 g/dL   Albumin 3.5 3.5 - 5.0 g/dL   AST 15 15 - 41 U/L   ALT 13 (L) 14 - 54 U/L   Alkaline Phosphatase 29 (L) 38 - 126 U/L   Total Bilirubin 0.1 (L) 0.3 - 1.2 mg/dL   GFR calc non Af Amer >60 >60 mL/min   GFR calc Af Amer >60 >60 mL/min    Comment: (NOTE) The eGFR has been calculated using the CKD EPI equation. This calculation has not been validated in all clinical situations. eGFR's persistently <60  mL/min signify possible Chronic Kidney Disease.    Anion gap 8 5 - 15    Comment: Performed at [facility], [place] [address], [place], [place] [address]  CBC with Differential     Status: Abnormal   Collection Time: 10/28/17  8:11 PM  Result Value Ref Range   WBC 9.1 4.0 - 10.5 K/uL   RBC 2.76 (L) 3.87 - 5.11 MIL/uL   Hemoglobin 7.9 (L) 12.0 - 15.0 g/dL   HCT 24.7 (L) 36.0 - 46.0 %   MCV 89.5 78.0 - 100.0 fL   MCH 28.6 26.0 - 34.0 pg   MCHC 32.0 30.0 - 36.0 g/dL   RDW 14.7 11.5 - 15.5 %   Platelets 319 150 - 400 K/uL   Neutrophils Relative % 71 %   Neutro Abs 6.3 1.7 - 7.7 K/uL   Lymphocytes Relative 20 %   Lymphs Abs 1.8 0.7 - 4.0 K/uL   Monocytes Relative 6 %   Monocytes Absolute 0.6 0.1 - 1.0 K/uL   Eosinophils Relative 3 %   Eosinophils Absolute 0.3 0.0 - 0.7 K/uL   Basophils Relative 0 %   Basophils Absolute 0.0 0.0 - 0.1 K/uL    Comment: Performed at [facility], [place] [address], [place], [place] [address]  Magnesium     Status: Abnormal   Collection Time: 10/28/17  8:11 PM  Result Value Ref Range   Magnesium 1.6 (L) 1.7 - 2.4 mg/dL    Comment: Performed at [facility], [place] [address] [place], [place] [address]  I-Stat beta hCG blood, ED     Status: None   Collection Time: 10/28/17  8:18 PM  Result Value Ref Range   I-stat hCG, quantitative <5.0 <5 mIU/mL   Comment 3            Comment:   GEST. AGE      CONC.  (mIU/mL)   <=1 WEEK        5 - 50     2 WEEKS       50 - 500     3 WEEKS       100 - 10,000     4 WEEKS     1,000 - 30,000        FEMALE AND NON-PREGNANT FEMALE:     LESS THAN 5 mIU/mL   Type and screen     Status: None (Preliminary result)   Collection Time: 10/28/17  8:59 PM  Result Value Ref Range   ABO/RH(D) O POS    Antibody Screen NEG    Sample Expiration 10/31/2017    Unit Number [id number]    Blood Component Type RED CELLS,LR    Unit division 00    Status of Unit ISSUED    Transfusion Status OK TO TRANSFUSE    Crossmatch Result      Compatible Performed at [facility], [place] [address], [place], [place] [address]    Unit Number [id number]    Blood Component Type RED CELLS,LR    Unit division 00    Status of Unit ISSUED,FINAL    Transfusion Status OK TO TRANSFUSE    Crossmatch Result Compatible   ABO/Rh     Status: None   Collection Time: 10/28/17  8:59 PM  Result Value Ref Range   ABO/RH(D)      O POS Performed at [facility], [place] [address], [place], [place] [address]   Vitamin B12  Status: None   Collection Time: 10/28/17  9:00 PM  Result Value Ref Range   Vitamin B-12 426 180 - 914 pg/mL    Comment: (NOTE) This assay is not validated for testing neonatal or myeloproliferative syndrome specimens for Vitamin B12 levels. Performed at [facility], [place] [address], [place], [place] [address]   Folate     Status: None    Collection Time: 10/28/17  9:00 PM  Result Value Ref Range   Folate 7.9 >5.9 ng/mL    Comment: Performed at [facility], [place] [address] [place], [place] [address]  Iron and TIBC     Status: None   Collection Time: 10/28/17  9:00 PM  Result Value Ref Range   Iron 90 28 - 170 ug/dL   TIBC 381 250 - 450 ug/dL   Saturation Ratios 24 10.4 - 31.8 %   UIBC 291 ug/dL    Comment: Performed at [facility], [place] [address] [place], [place] [address]  Ferritin     Status: Abnormal   Collection Time: 10/28/17  9:00 PM  Result Value Ref Range   Ferritin 5 (L) 11 - 307 ng/mL    Comment: Performed at [facility], [place] [address] [place], [place] [address]  Reticulocytes     Status: Abnormal   Collection Time: 10/28/17  9:00 PM  Result Value Ref Range   Retic Ct Pct 4.0 (H) 0.4 - 3.1 %   RBC. 2.79 (L) 3.87 - 5.11 MIL/uL   Retic Count, Absolute 111.6 19.0 - 186.0 K/uL    Comment: Performed at [facility], [place] [address] [place] [address], [place], [place] [address]  Prepare RBC     Status: None   Collection Time: 10/28/17  9:00 PM  Result Value Ref Range   Order Confirmation      ORDER PROCESSED BY BLOOD BANK Performed at [facility], [place] [address], [place], [place] [address]   Urinalysis, Routine w reflex microscopic     Status: Abnormal   Collection Time: 10/28/17  9:27 PM  Result Value Ref Range   Color, Urine STRAW (A) YELLOW   APPearance CLEAR CLEAR   Specific Gravity, Urine 1.003 (L) 1.005 - 1.030   pH 6.0 5.0 - 8.0   Glucose, UA NEGATIVE NEGATIVE mg/dL   Hgb urine dipstick LARGE (A) NEGATIVE   Bilirubin Urine NEGATIVE NEGATIVE   Ketones, ur NEGATIVE NEGATIVE mg/dL   Protein, ur NEGATIVE NEGATIVE mg/dL   Nitrite NEGATIVE NEGATIVE   Leukocytes, UA NEGATIVE NEGATIVE   RBC / HPF 6-10 0 - 5 RBC/hpf   WBC, UA 6-10 0 - 5 WBC/hpf   Bacteria, UA RARE (A) NONE SEEN   Squamous Epithelial / LPF 0-5 0 - 5    Comment: Please note change in  reference range. Performed at [facility], [place] [address] [place], [place] [address]   Protime-INR     Status: None   Collection Time: 10/29/17  5:44 AM  Result Value Ref Range   Prothrombin Time 14.4 11.4 - 15.2 seconds   INR 1.13     Comment: Performed at [facility], [place] [address] [place], [place] [address]  Fibrinogen     Status: None   Collection Time: 10/29/17  5:44 AM  Result Value Ref Range   Fibrinogen 245 210 - 475 mg/dL    Comment: Performed at [facility], [place] [address], [place], [place] [address]  APTT     Status: None   Collection Time: 10/29/17  5:44 AM  Result Value Ref Range   aPTT 30 24 - 36 seconds    Comment: Performed at [facility], [place] [address] [place], [place] [address]  Magnesium     Status: Abnormal   Collection Time: 10/29/17  5:44 AM  Result Value Ref Range   Magnesium 1.6 (L) 1.7 - 2.4 mg/dL    Comment: Performed at [facility], [place] [address] [place], [place] [address]  Phosphorus     Status: None   Collection Time: 10/29/17  5:44 AM  Result Value Ref Range   Phosphorus 2.7 2.5 - 4.6 mg/dL    Comment: Performed at [facility], [place] [address] [place], [place] [address]  TSH     Status: None   Collection Time: 10/29/17  5:44 AM  Result Value Ref Range   TSH 2.514 0.350 - 4.500 uIU/mL    Comment: Performed by a 3rd Generation assay with a functional sensitivity of <=0.01 uIU/mL. Performed at [facility], [place] [address] [place], [place] [address]   Comprehensive metabolic panel     Status: Abnormal   Collection Time: 10/29/17  5:44 AM  Result Value Ref Range   Sodium 142 135 - 145 mmol/L   Potassium 3.6 3.5 - 5.1 mmol/L   Chloride 113 (H) 101 - 111 mmol/L   CO2 21 (L) 22 - 32 mmol/L   Glucose, Bld 103 (H) 65 - 99 mg/dL   BUN 13 6 - 20 mg/dL   Creatinine, Ser 0.53 0.44 - 1.00 mg/dL   Calcium 8.0 (L)  8.9 - 10.3 mg/dL   Total Protein 5.0 (L) 6.5 - 8.1 g/dL   Albumin 2.9 (L) 3.5 - 5.0 g/dL   AST 14 (L) 15 - 41 U/L   ALT 13 (L) 14 - 54 U/L   Alkaline Phosphatase 25 (L) 38 - 126 U/L   Total Bilirubin 0.5 0.3 - 1.2 mg/dL   GFR calc non Af Amer >60 >60 mL/min   GFR calc Af Amer >60 >60 mL/min    Comment: (NOTE) The eGFR has been calculated using the CKD EPI equation. This calculation has not been validated in all clinical situations. eGFR's persistently <60 mL/min signify possible Chronic Kidney Disease.    Anion gap 8 5 - 15    Comment: Performed at [facility], [place] [address], [place], [place] [address]  CBC     Status: Abnormal   Collection Time: 10/29/17  5:44 AM  Result Value Ref Range   WBC 6.5 4.0 - 10.5 K/uL   RBC 2.75 (L) 3.87 - 5.11 MIL/uL   Hemoglobin 7.6 (L) 12.0 - 15.0 g/dL   HCT 23.3 (L) 36.0 - 46.0 %   MCV 84.7 78.0 - 100.0 fL   MCH 27.6 26.0 - 34.0 pg   MCHC 32.6 30.0 - 36.0 g/dL   RDW 18.3 (H) 11.5 - 15.5 %   Platelets 261 150 - 400 K/uL    Comment: Performed at [facility], [place] [address] [place], [place] [address]  CBC     Status: Abnormal   Collection Time: 10/29/17 11:08 AM  Result Value Ref Range   WBC 5.9 4.0 - 10.5 K/uL   RBC 3.04 (L) 3.87 - 5.11 MIL/uL   Hemoglobin 8.3 (L) 12.0 - 15.0 g/dL   HCT 26.1 (L) 36.0 - 46.0 %   MCV 85.9  78.0 - 100.0 fL   MCH 27.3 26.0 - 34.0 pg   MCHC 31.8 30.0 - 36.0 g/dL   RDW 18.5 (H) 11.5 - 15.5 %   Platelets 256 150 - 400 K/uL    Comment: Performed at [facility], [place] [address] [place], [place] [address]   Ct Abdomen Pelvis W Contrast  Result Date: 10/28/2017 CLINICAL DATA:  Nausea, vomiting. EXAM: CT ABDOMEN AND PELVIS WITH CONTRAST TECHNIQUE: Multidetector CT imaging of the abdomen and pelvis was performed using the standard protocol following bolus administration of intravenous contrast. CONTRAST:  100 mL ISOVUE-300 IOPAMIDOL (ISOVUE-300) INJECTION 61%  COMPARISON:  None. FINDINGS: Lower chest: No acute abnormality. Hepatobiliary: No focal liver abnormality is seen. No gallstones, gallbladder wall thickening, or biliary dilatation. Pancreas: Unremarkable. No pancreatic ductal dilatation or surrounding inflammatory changes. Spleen: Normal in size without focal abnormality. Adrenals/Urinary Tract: Adrenal glands appear normal. Left kidney and ureter are unremarkable. Nonobstructive right nephrolithiasis is noted. Mild right hydronephrosis is noted without obstructing calculus or ureteral dilatation. Urinary bladder is unremarkable. Stomach/Bowel: Stomach is within normal limits. Appendix appears normal. No evidence of bowel wall thickening, distention, or inflammatory changes. Vascular/Lymphatic: No significant vascular findings are present. No enlarged abdominal or pelvic lymph nodes. Reproductive: 5.6 cm uterine fibroid is noted. No adnexal abnormality is noted. Other: No abdominal wall hernia or abnormality. No abdominopelvic ascites. Musculoskeletal: No acute or significant osseous findings. IMPRESSION: Nonobstructive right nephrolithiasis is noted. 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 5.6 cm uterine fibroid is noted. Electronically Signed   By: Marijo Conception, M.D.   On: 10/28/2017 23:42    Pending Labs Unresulted Labs (From admission, onward)   Start     Ordered   10/30/17 6333  Basic metabolic panel  Tomorrow morning,   R     10/29/17 1206   10/30/17 0500  Magnesium  Tomorrow morning,   R     10/29/17 1206   10/30/17 0500  CBC  Tomorrow morning,   R     10/29/17 1206   10/29/17 0508  HIV antibody (Routine Testing)  Once,   R     10/29/17 0507   10/29/17 0508  CBC  Now then every 6 hours,   STAT    Comments:  Call MD if Hg <8, plt <100    10/29/17 0507      Vitals/Pain Today's Vitals   10/29/17 1102 10/29/17 1130 10/29/17 1226 10/29/17 1300   BP: 122/86 (!) 124/91 123/89 120/79  Pulse: 90 95 93 89  Resp: (!) 23 15 (!) 22 (!) 24  Temp: 98.9 F (37.2 C)     TempSrc: Oral     SpO2: 100% 100% 100% 100%  Weight:      PainSc:        Isolation Precautions No active isolations  Medications Medications  sodium chloride 0.9 % bolus 1,000 mL (0 mLs Intravenous Stopped 10/28/17 2157)    And  0.9 %  sodium chloride infusion ( Intravenous Rate/Dose Change 10/29/17 1227)  ondansetron (ZOFRAN) injection 4 mg (4 mg Intravenous Refused 10/28/17 2123)  acetaminophen (TYLENOL) tablet 650 mg (has no administration in time range)    Or  acetaminophen (TYLENOL) suppository 650 mg (has no administration in time range)  ondansetron (ZOFRAN) tablet 4 mg (has no administration in time range)    Or  ondansetron (ZOFRAN) injection 4 mg (has no administration in time range)  pantoprazole (PROTONIX) 80  mg in sodium chloride 0.9 % 250 mL (0.32 mg/mL) infusion (0 mg/hr Intravenous Stopped 10/29/17 1051)  pantoprazole (PROTONIX) injection 40 mg (has no administration in time range)  potassium chloride 10 mEq in 100 mL IVPB (0 mEq Intravenous Stopped 10/29/17 0850)  magnesium sulfate IVPB 2 g 50 mL (2 g Intravenous New Bag/Given 10/29/17 1227)  0.9 %  sodium chloride infusion (0 mL/hr Intravenous Stopped 10/29/17 0146)  pantoprazole (PROTONIX) injection 40 mg (40 mg Intravenous Given 10/28/17 2118)  iopamidol (ISOVUE-300) 61 % injection (100 mLs Intravenous Contrast Given 10/28/17 2232)  pantoprazole (PROTONIX) injection 40 mg (40 mg Intravenous Given 10/28/17 2227)    Mobility walks with person assist

## 2017-10-29 NOTE — ED Notes (Signed)
Pt using purewick catheter at this time.

## 2017-10-29 NOTE — ED Notes (Signed)
Pt no longer using purewick catheter.

## 2017-10-29 NOTE — Consult Note (Addendum)
Referring Provider: [facility]  Primary Care Physician:  Girtha Rm, NP-C Primary Gastroenterologist: Zenovia Jarred,  MD  Reason for Consultation:   GI bleed    ASSESSMENT AND PLAN:    54. 52 year old female with hematemesis / black stool over the weekend associated with acute drop in hemoglobin from 13 in August 2018 to 7.6. CT scan with contrast unrevealing of GI pathology. Rule out PUD, Mallory Weiss tear vrs other etiologies.  -Patient will need an upper endoscopy for evaluation of upper GI bleeding.  This would likely be done in the a.m.. The risks and benefits of EGD were discussed and the patient agrees to proceed.  -Blood transfusion has already been ordered - monitor CBC -Agree with PPI drip  -keep on clears, n.p.o. after midnight   2. Iron deficiency.  Presumably the anemia is acute but she is iron deficient which could be secondary to menses. Not taking NSAIDs and up to date on screening colonoscopy (Jan 2019).   3. Non-obstructing right nephrolithiasis.  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 female known to Dr. Hilarie Fredrickson who performed her screening colonoscopy in January of this year.  Several small polyps removed, pathology compatible with both tubular adenomas as well as hyperplastic polyps.  Patient presented to the emergency department last evening evaluation of hematemesis.  Hematemesis started on Friday at work.  Patient had several episodes of dark red bloody emesis, this was eventually followed by passage of loose black stools.  No significant abdominal pain was associated with vomiting.  There was blood present for the first time that she vomited on Friday.  No NSAID use.  She takes OTC Tylenol pain reliever.  In the emergency department patient has been mildly tachycardic, no hypo- tension.  She has had a significant drop in her hemoglobin from a baseline of 13 down to 7.6 today.  Initial BUN was elevated  slightly at 25. she has a ferritin of 5 but still menstruates.    Anita Holland had some mild dyspnea yesterday.  No chest pain. No urinary sx. No fevers. She is currently on menstrual cycle as having cramps.    Past Medical History:  Diagnosis Date  . Allergy   . Arthritis   . GERD (gastroesophageal reflux disease)   . 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  Colposcopy showed inflammatory changes.  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  Recommend follow-up Pap smear 1 year    Past Surgical History:  Procedure Laterality Date  . DILATION AND CURETTAGE OF UTERUS      Prior to Admission medications   Medication Sig Start Date End Date Taking? Authorizing Provider  diphenhydramine-acetaminophen (TYLENOL PM) 25-500 MG TABS tablet Take 2 tablets by mouth at bedtime as needed (sleep, pain).   Yes [provider]  doxycycline (VIBRAMYCIN) 100 MG capsule Take 1 capsule (100 mg total) by mouth 2 (two) times daily. Patient not taking: Reported on 07/16/2017 07/06/17   Fontaine, Belinda Block, MD    Current Facility-Administered Medications  Medication Dose Route Frequency Provider Last Rate Last Dose  . 0.9 %  sodium chloride infusion   Intravenous Continuous Toy Baker, MD 125 mL/hr at 10/28/17 2158    . 0.9 %  sodium chloride infusion   Intravenous Continuous Toy Baker, MD   Stopped at 10/29/17 9417  . acetaminophen (TYLENOL) tablet 650 mg  650 mg Oral Q6H PRN Toy Baker, MD  Or  . acetaminophen (TYLENOL) suppository 650 mg  650 mg Rectal Q6H PRN Doutova, Anastassia, MD      . ondansetron (ZOFRAN) injection 4 mg  4 mg Intravenous Once Doutova, Anastassia, MD      . ondansetron (ZOFRAN) tablet 4 mg  4 mg Oral Q6H PRN Doutova, Anastassia, MD       Or  . ondansetron (ZOFRAN) injection 4 mg  4 mg Intravenous Q6H PRN Doutova, Anastassia, MD      . pantoprazole (PROTONIX) 80 mg in sodium chloride 0.9 % 250 mL  (0.32 mg/mL) infusion  8 mg/hr Intravenous Continuous Doutova, Anastassia, MD 25 mL/hr at 10/28/17 2326 8 mg/hr at 10/28/17 2326  . [START ON 11/01/2017] pantoprazole (PROTONIX) injection 40 mg  40 mg Intravenous Q12H Toy Baker, MD       Current Outpatient Medications  Medication Sig Dispense Refill  . diphenhydramine-acetaminophen (TYLENOL PM) 25-500 MG TABS tablet Take 2 tablets by mouth at bedtime as needed (sleep, pain).    Marland Kitchen doxycycline (VIBRAMYCIN) 100 MG capsule Take 1 capsule (100 mg total) by mouth 2 (two) times daily. (Patient not taking: Reported on 07/16/2017) 14 capsule 0    Allergies as of 10/28/2017  . (No Known Allergies)    Family History  Problem Relation Age of Onset  . Stroke Sister 60  . Hypertension Sister   . Depression Sister   . Heart attack Sister 66  . Colon cancer Neg Hx   . Colon polyps Neg Hx   . Esophageal cancer Neg Hx   . Rectal cancer Neg Hx   . Stomach cancer Neg Hx     Social History   Socioeconomic History  . Marital status: Single    Spouse name: Not on file  . Number of children: Not on file  . Years of education: Not on file  . Highest education level: Not on file  Occupational History  . Not on file  Social Needs  . Financial resource strain: Not on file  . Food insecurity:    Worry: Not on file    Inability: Not on file  . Transportation needs:    Medical: Not on file    Non-medical: Not on file  Tobacco Use  . Smoking status: Never Smoker  . Smokeless tobacco: Never Used  Substance and Sexual Activity  . Alcohol use: No  . Drug use: No  . Sexual activity: Yes    Birth control/protection: Condom    Comment: 1st intercourse 15yo-5 partners  Lifestyle  . Physical activity:    Days per week: Not on file    Minutes per session: Not on file  . Stress: Not on file  Relationships  . Social connections:    Talks on phone: Not on file    Gets together: Not on file    Attends religious service: Not on file    Active  member of club or organization: Not on file    Attends meetings of clubs or organizations: Not on file    Relationship status: Not on file  . Intimate partner violence:    Fear of current or ex partner: Not on file    Emotionally abused: Not on file    Physically abused: Not on file    Forced sexual activity: Not on file  Other Topics Concern  . Not on file  Social History Narrative  . Not on file    Review of Systems: All systems reviewed and negative except where noted in HPI.  Physical Exam: Vital signs in last 24 hours: Temp:  [97.3 F (36.3 C)-99 F (37.2 C)] 99 F (37.2 C) (05/06 0921) Pulse Rate:  [96-119] 97 (05/06 0916) Resp:  [14-25] 24 (05/06 0916) BP: (108-140)/(69-107) 127/83 (05/06 0916) SpO2:  [95 %-100 %] 100 % (05/06 0916) Weight:  [176 lb (79.8 kg)] 176 lb (79.8 kg) (05/05 1925)   General:   Alert, well-developed, black female in NAD Psych:  Pleasant, cooperative. Normal mood and affect. Eyes:  Pupils equal, sclera clear, no icterus.   Conjunctiva pink. Ears:  Normal auditory acuity. Nose:  No deformity, discharge,  or lesions. Neck:  Supple; no masses Lungs:  Clear throughout to auscultation.   No wheezes, crackles, or rhonchi.  Heart:  Regular rate and rhythm; no murmurs, no edema Abdomen:  Soft, non-distended, nontender, BS active, no palp mass    Rectal:  Deferred  Msk:  Symmetrical without gross deformities. . Neurologic:  Alert and  oriented x4;  grossly normal neurologically. Skin:  Intact without significant lesions or rashes..   Intake/Output from previous day: 05/05 0701 - 05/06 0700 In: 945 [Blood:945] Out: -  Intake/Output this shift: No intake/output data recorded.  Lab Results: Recent Labs    10/28/17 2011 10/29/17 0544  WBC 9.1 6.5  HGB 7.9* 7.6*  HCT 24.7* 23.3*  PLT 319 261   BMET Recent Labs    10/28/17 2011 10/29/17 0544  NA 139 142  K 3.2* 3.6  CL 110 113*  CO2 21* 21*  GLUCOSE 127* 103*  BUN 25* 13    CREATININE 0.63 0.53  CALCIUM 8.4* 8.0*   LFT Recent Labs    10/29/17 0544  PROT 5.0*  ALBUMIN 2.9*  AST 14*  ALT 13*  ALKPHOS 25*  BILITOT 0.5   PT/INR Recent Labs    10/29/17 0544  LABPROT 14.4  INR 1.13    Studies/Results: Ct Abdomen Pelvis W Contrast  Result Date: 10/28/2017 CLINICAL DATA:  Nausea, vomiting. EXAM: CT ABDOMEN AND PELVIS WITH CONTRAST TECHNIQUE: Multidetector CT imaging of the abdomen and pelvis was performed using the standard protocol following bolus administration of intravenous contrast. CONTRAST:  100 mL ISOVUE-300 IOPAMIDOL (ISOVUE-300) INJECTION 61% COMPARISON:  None. FINDINGS: Lower chest: No acute abnormality. Hepatobiliary: No focal liver abnormality is seen. No gallstones, gallbladder wall thickening, or biliary dilatation. Pancreas: Unremarkable. No pancreatic ductal dilatation or surrounding inflammatory changes. Spleen: Normal in size without focal abnormality. Adrenals/Urinary Tract: Adrenal glands appear normal. Left kidney and ureter are unremarkable. Nonobstructive right nephrolithiasis is noted. Mild right hydronephrosis is noted without obstructing calculus or ureteral dilatation. Urinary bladder is unremarkable. Stomach/Bowel: Stomach is within normal limits. Appendix appears normal. No evidence of bowel wall thickening, distention, or inflammatory changes. Vascular/Lymphatic: No significant vascular findings are present. No enlarged abdominal or pelvic lymph nodes. Reproductive: 5.6 cm uterine fibroid is noted. No adnexal abnormality is noted. Other: No abdominal wall hernia or abnormality. No abdominopelvic ascites. Musculoskeletal: No acute or significant osseous findings. IMPRESSION: Nonobstructive right nephrolithiasis is noted. 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 5.6 cm uterine fibroid is noted. Electronically Signed   By: Marijo Conception, M.D.    On: 10/28/2017 23:42     Tye Savoy, NP-C @  10/29/2017, 9:56 AM  Pager number [phone]   Attending physician's note   I have taken a history, examined the patient and reviewed the chart. I agree with the Advanced Practitioner's note, impression and recommendations.  37 Female presented with melena and hematemesis, acute drop in hemoglobin from 13 to 7 and also has iron deficiency .  Reviewed recent colonoscopy's findings.  Plan for EGD to evaluate source of possible upper GI bleed The risks and benefits as well as alternatives of endoscopic procedure(s) have been discussed and reviewed. All questions answered. The patient agrees to proceed. Monitor hemoglobin every 12 hours and transfuse as needed with goal greater than 7 Continue PPI Clear liquids and n.p.o. after midnight  K. Denzil Magnuson , MD [phone]

## 2017-10-29 NOTE — ED Notes (Signed)
No reaction to blood transfusion noted alert and oriented x 3 no reaction to medication noted call light in reach able to speak in full sentences.

## 2017-10-30 ENCOUNTER — Inpatient Hospital Stay (HOSPITAL_COMMUNITY): Payer: BLUE CROSS/BLUE SHIELD | Admitting: Registered Nurse

## 2017-10-30 ENCOUNTER — Encounter (HOSPITAL_COMMUNITY): Payer: Self-pay | Admitting: *Deleted

## 2017-10-30 ENCOUNTER — Encounter (HOSPITAL_COMMUNITY)
Admission: EM | Disposition: A | Discharge status: Home / Self Care | Payer: Self-pay | Source: Home / Self Care | Attending: Family Medicine

## 2017-10-30 DIAGNOSIS — K208 Other esophagitis: Secondary | ICD-10-CM

## 2017-10-30 DIAGNOSIS — K254 Chronic or unspecified gastric ulcer with hemorrhage: Principal | ICD-10-CM

## 2017-10-30 DIAGNOSIS — K921 Melena: Secondary | ICD-10-CM

## 2017-10-30 DIAGNOSIS — K2971 Gastritis, unspecified, with bleeding: Secondary | ICD-10-CM

## 2017-10-30 DIAGNOSIS — B9681 Helicobacter pylori [H. pylori] as the cause of diseases classified elsewhere: Secondary | ICD-10-CM

## 2017-10-30 HISTORY — PX: ESOPHAGOGASTRODUODENOSCOPY: SHX5428

## 2017-10-30 LAB — BASIC METABOLIC PANEL
ANION GAP: 6 (ref 5–15)
BUN: 5 mg/dL — ABNORMAL LOW (ref 6–20)
CALCIUM: 8.7 mg/dL — AB (ref 8.9–10.3)
CO2: 23 mmol/L (ref 22–32)
Chloride: 112 mmol/L — ABNORMAL HIGH (ref 101–111)
Creatinine, Ser: 0.53 mg/dL (ref 0.44–1.00)
GFR calc Af Amer: >60 mL/min (ref >60)
GFR calc non Af Amer: >60 mL/min (ref >60)
GLUCOSE: 102 mg/dL — AB (ref 65–99)
Potassium: 4 mmol/L (ref 3.5–5.1)
Sodium: 141 mmol/L (ref 135–145)

## 2017-10-30 LAB — TYPE AND SCREEN
ABO/RH(D): O POS
ANTIBODY SCREEN: NEGATIVE
UNIT DIVISION: 0
Unit division: 0

## 2017-10-30 LAB — BPAM RBC
Blood Product Expiration Date: 201906012359
Blood Product Expiration Date: 201906012359
ISSUE DATE / TIME: 201905052320
ISSUE DATE / TIME: 201905061036
UNIT TYPE AND RH: 5100
Unit Type and Rh: 5100

## 2017-10-30 LAB — CBC
HEMATOCRIT: 30.4 % — AB (ref 36.0–46.0)
HEMOGLOBIN: 9.7 g/dL — AB (ref 12.0–15.0)
MCH: 27.7 pg (ref 26.0–34.0)
MCHC: 31.9 g/dL (ref 30.0–36.0)
MCV: 86.9 fL (ref 78.0–100.0)
PLATELETS: 268 10*3/uL (ref 150–400)
RBC: 3.5 MIL/uL — AB (ref 3.87–5.11)
RDW: 17.8 % — ABNORMAL HIGH (ref 11.5–15.5)
WBC: 5.8 10*3/uL (ref 4.0–10.5)

## 2017-10-30 LAB — MAGNESIUM: Magnesium: 2 mg/dL (ref 1.7–2.4)

## 2017-10-30 SURGERY — EGD (ESOPHAGOGASTRODUODENOSCOPY)
Anesthesia: Monitor Anesthesia Care

## 2017-10-30 MED ORDER — LACTATED RINGERS IV SOLN
INTRAVENOUS | Status: DC
  Administered 2017-10-30: 08:00:00 via INTRAVENOUS

## 2017-10-30 MED ORDER — GLYCOPYRROLATE 0.2 MG/ML IJ SOLN
INTRAMUSCULAR | Status: DC | PRN
  Administered 2017-10-30: 0.2 mg via INTRAVENOUS

## 2017-10-30 MED ORDER — PROPOFOL 10 MG/ML IV BOLUS
INTRAVENOUS | Status: AC
  Filled 2017-10-30: qty 40

## 2017-10-30 MED ORDER — LIDOCAINE HCL (CARDIAC) PF 100 MG/5ML IV SOSY
PREFILLED_SYRINGE | INTRAVENOUS | Status: DC | PRN
  Administered 2017-10-30: 75 mg via INTRAVENOUS

## 2017-10-30 MED ORDER — PROPOFOL 500 MG/50ML IV EMUL
INTRAVENOUS | Status: DC | PRN
  Administered 2017-10-30: 300 ug/kg/min via INTRAVENOUS

## 2017-10-30 MED ORDER — LACTATED RINGERS IV SOLN
INTRAVENOUS | Status: DC | PRN
  Administered 2017-10-30: 09:00:00 via INTRAVENOUS

## 2017-10-30 MED ORDER — PANTOPRAZOLE SODIUM 40 MG PO TBEC
40.0000 mg | DELAYED_RELEASE_TABLET | Freq: Two times a day (BID) | ORAL | Status: DC
Start: 2017-10-30 — End: 2017-10-31
  Administered 2017-10-30 – 2017-10-31 (×2): 40 mg via ORAL
  Filled 2017-10-30 (×3): qty 1

## 2017-10-30 NOTE — Anesthesia Procedure Notes (Signed)
Procedure Name: MAC Date/Time: 10/30/2017 8:53 AM Performed by: Lissa Morales, CRNA Pre-anesthesia Checklist: Patient identified, Emergency Drugs available, Suction available, Patient being monitored and Timeout performed Oxygen Delivery Method: Nasal cannula Placement Confirmation: positive ETCO2

## 2017-10-30 NOTE — Interval H&P Note (Signed)
History and Physical Interval Note:  10/30/2017 8:25 AM  Anita Holland  has presented today for surgery, with the diagnosis of egd  The various methods of treatment have been discussed with the patient and family. After consideration of risks, benefits and other options for treatment, the patient has consented to  Procedure(s): ESOPHAGOGASTRODUODENOSCOPY (EGD) (N/A) as a surgical intervention .  The patient's history has been reviewed, patient examined, no change in status, stable for surgery.  I have reviewed the patient's chart and labs.  Questions were answered to the patient's satisfaction.     Kavitha Nandigam

## 2017-10-30 NOTE — Anesthesia Preprocedure Evaluation (Signed)
Anesthesia Evaluation  Patient identified by MRN, date of birth, ID band Patient awake    Reviewed: Allergy & Precautions, NPO status , Patient's Chart, lab work & pertinent test results  Airway Mallampati: II  TM Distance: >3 FB Neck ROM: Full    Dental no notable dental hx.    Pulmonary neg pulmonary ROS,    Pulmonary exam normal breath sounds clear to auscultation       Cardiovascular negative cardio ROS Normal cardiovascular exam Rhythm:Regular Rate:Normal     Neuro/Psych negative neurological ROS  negative psych ROS   GI/Hepatic Neg liver ROS, GERD  ,  Endo/Other  negative endocrine ROS  Renal/GU negative Renal ROS  negative genitourinary   Musculoskeletal negative musculoskeletal ROS (+)   Abdominal   Peds negative pediatric ROS (+)  Hematology  (+) anemia ,   Anesthesia Other Findings   Reproductive/Obstetrics negative OB ROS                             Anesthesia Physical Anesthesia Plan  ASA: II  Anesthesia Plan: MAC   Post-op Pain Management:    Induction: Intravenous  PONV Risk Score and Plan: 0  Airway Management Planned: Simple Face Mask  Additional Equipment:   Intra-op Plan:   Post-operative Plan:   Informed Consent: I have reviewed the patients History and Physical, chart, labs and discussed the procedure including the risks, benefits and alternatives for the proposed anesthesia with the patient or authorized representative who has indicated his/her understanding and acceptance.   Dental advisory given  Plan Discussed with: CRNA and Surgeon  Anesthesia Plan Comments:         Anesthesia Quick Evaluation

## 2017-10-30 NOTE — Progress Notes (Signed)
PROGRESS NOTE [facility]   Shacoya Burkhammer   DDU:[id number] DOB: [birth year]  DOA: 10/28/2017 PCP: Girtha Rm, NP-C   Brief Narrative:  Anita Holland 52 year old female with no significant medical history presented to the emergency department complaining of nausea, bloody emesis and black stools.  Patient report that 3 days ago has been vomiting after eating [place] food.  Emesis is nonbloody.  Associated symptoms include diarrhea with dark black stools.  Upon ED evaluation was found to be 7.9, BUN 25, creatinine stable, tachycardic and blood pressure stable.  Patient was admitted with working diagnosis of upper GI bleed.  Subjective: Patient seen and examined, she is status post EGD.  Denies abdominal pain, nausea vomiting.  Assessment & Plan: Symptomatic anemia/upper GI bleed - hematemesis Status post 2 units of PRBC, hemoglobin responded as expected.  Today 9.7. Status post EGD found LA grade B erosive esophagitis, gastric ulcer with a visible vessel.  Suspicious for H. pylori.  Biopsy was taken and bipolar cautery applied.  GI recommending Prilosec 40 mg twice daily for 93-month then 40 mg daily.  Case discussed with GI and recommended to monitor overnight to ensure stability of CBC.  If hemoglobin in a.m. stable will discharge home. Up today colonoscopy (January 2019).  No history of NSAID use.    GERD Continue PPI as above  Hypokalemia/hypomagnesemia Resolved  DVT prophylaxis: SCDs Code Status: Full code Family Communication: None at bedside Disposition Plan: Home in a.m. if hemoglobin stable  Consultants:   GI  Procedures:   None  Antimicrobials:  None   Objective: Vitals:   10/30/17 0759 10/30/17 0921 10/30/17 0925 10/30/17 0930  BP: 132/84   108/85  Pulse: 89 (!) 109 (!) 109 (!) 105  Resp: (!) 25 (!) 22 (!) 21 (!) 21  Temp: 98 F (36.7 C) 98.5 F (36.9 C)    TempSrc: Oral Oral    SpO2: 100% 100% 100% 100%  Weight: 79.8 kg (176 lb)     Height:  5\' 3"  (1.6 m)       Intake/Output Summary (Last 24 hours) at 10/30/2017 1429 Last data filed at 10/30/2017 2376 Gross per 24 hour  Intake 2228.76 ml  Output 2 ml  Net 2226.76 ml   Filed Weights   10/28/17 1925 10/29/17 1356 10/30/17 0759  Weight: 79.8 kg (176 lb) 79.8 kg (176 lb) 79.8 kg (176 lb)    Examination:  General: Pt is alert, awake, not in acute distress Cardiovascular: RRR, S1/S2 +, no rubs, no gallops Respiratory: CTA bilaterally, no wheezing, no rhonchi Abdominal: Soft, NT, ND, bowel sounds + Extremities: no edema, no cyanosis   Data Reviewed: I have personally reviewed following labs and imaging studies  CBC: Recent Labs  Lab 10/28/17 2011 10/29/17 0544 10/29/17 1108 10/29/17 1705 10/29/17 2256 10/30/17 0552  WBC 9.1 6.5 5.9 6.7 6.8 5.8  NEUTROABS 6.3  --   --   --   --   --   HGB 7.9* 7.6* 8.3* 9.8* 9.9* 9.7*  HCT 24.7* 23.3* 26.1* 29.7* 29.7* 30.4*  MCV 89.5 84.7 85.9 85.8 85.8 86.9  PLT 319 261 256 264 287 283   Basic Metabolic Panel: Recent Labs  Lab 10/28/17 2011 10/29/17 0544 10/30/17 0552  NA 139 142 141  K 3.2* 3.6 4.0  CL 110 113* 112*  CO2 21* 21* 23  GLUCOSE 127* 103* 102*  BUN 25* 13 5*  CREATININE 0.63 0.53 0.53  CALCIUM 8.4* 8.0* 8.7*  MG 1.6* 1.6* 2.0  PHOS  --  2.7  --    GFR: Estimated Creatinine Clearance: 84.2 mL/min (by C-G formula based on SCr of 0.53 mg/dL). Liver Function Tests: Recent Labs  Lab 10/28/17 2011 10/29/17 0544  AST 15 14*  ALT 13* 13*  ALKPHOS 29* 25*  BILITOT 0.1* 0.5  PROT 6.0* 5.0*  ALBUMIN 3.5 2.9*   Recent Labs  Lab 10/28/17 2011  LIPASE 25   No results for input(s): AMMONIA in the last 168 hours. Coagulation Profile: Recent Labs  Lab 10/29/17 0544  INR 1.13   Cardiac Enzymes: No results for input(s): CKTOTAL, CKMB, CKMBINDEX, TROPONINI in the last 168 hours. BNP (last 3 results) No results for input(s): PROBNP in the last 8760 hours. HbA1C: No results for input(s): HGBA1C in  the last 72 hours. CBG: No results for input(s): GLUCAP in the last 168 hours. Lipid Profile: No results for input(s): CHOL, HDL, LDLCALC, TRIG, CHOLHDL, LDLDIRECT in the last 72 hours. Thyroid Function Tests: Recent Labs    10/29/17 0544  TSH 2.514   Anemia Panel: Recent Labs    10/28/17 2100  VITAMINB12 426  FOLATE 7.9  FERRITIN 5*  TIBC 381  IRON 90  RETICCTPCT 4.0*   Sepsis Labs: No results for input(s): PROCALCITON, LATICACIDVEN in the last 168 hours.  No results found for this or any previous visit (from the past 240 hour(s)).    Radiology Studies: Ct Abdomen Pelvis W Contrast  Result Date: 10/28/2017 CLINICAL DATA:  Nausea, vomiting. EXAM: CT ABDOMEN AND PELVIS WITH CONTRAST TECHNIQUE: Multidetector CT imaging of the abdomen and pelvis was performed using the standard protocol following bolus administration of intravenous contrast. CONTRAST:  100 mL ISOVUE-300 IOPAMIDOL (ISOVUE-300) INJECTION 61% COMPARISON:  None. FINDINGS: Lower chest: No acute abnormality. Hepatobiliary: No focal liver abnormality is seen. No gallstones, gallbladder wall thickening, or biliary dilatation. Pancreas: Unremarkable. No pancreatic ductal dilatation or surrounding inflammatory changes. Spleen: Normal in size without focal abnormality. Adrenals/Urinary Tract: Adrenal glands appear normal. Left kidney and ureter are unremarkable. Nonobstructive right nephrolithiasis is noted. Mild right hydronephrosis is noted without obstructing calculus or ureteral dilatation. Urinary bladder is unremarkable. Stomach/Bowel: Stomach is within normal limits. Appendix appears normal. No evidence of bowel wall thickening, distention, or inflammatory changes. Vascular/Lymphatic: No significant vascular findings are present. No enlarged abdominal or pelvic lymph nodes. Reproductive: 5.6 cm uterine fibroid is noted. No adnexal abnormality is noted. Other: No abdominal wall hernia or abnormality. No abdominopelvic ascites.  Musculoskeletal: No acute or significant osseous findings. IMPRESSION: Nonobstructive right nephrolithiasis is noted. 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 5.6 cm uterine fibroid is noted. Electronically Signed   By: Marijo Conception, M.D.   On: 10/28/2017 23:42      Scheduled Meds: . ondansetron (ZOFRAN) IV  4 mg Intravenous Once  . pantoprazole  40 mg Oral BID AC   Continuous Infusions:    LOS: 2 days    Time spent: Total of 15 minutes spent with pt, greater than 50% of which was spent in discussion of  treatment, counseling and coordination of care  Chipper Oman, MD Pager: Text Page via [email or web address]   If 7PM-7AM, please contact night-coverage [email or web address] 10/30/2017, 2:29 PM   Note - This record has been created using [employer]. Chart creation errors have been sought, but may not always have been located. Such creation errors do not reflect on the standard of medical care.

## 2017-10-30 NOTE — Op Note (Signed)
[facility] Patient Name: Anita Holland Procedure Date: 10/30/2017 MRN: [id number] Attending MD: Mauri Pole , MD Date of Birth: Feb 14, 1966 CSN: [id number] Age: 52 Admit Type: Inpatient Procedure:                Upper GI endoscopy Indications:              Recent gastrointestinal bleeding, Suspected upper                            gastrointestinal bleeding, Iron deficiency anemia                            due to suspected upper gastrointestinal bleeding Providers:                Mauri Pole, MD, Cleda Daub, RN, Laurena Spies, Technician, Enrigue Catena, CRNA Referring MD:              Medicines:                Monitored Anesthesia Care Complications:            No immediate complications. Estimated Blood Loss:     Estimated blood loss was minimal. Procedure:                Pre-Anesthesia Assessment:                           - Prior to the procedure, a History and Physical                            was performed, and patient medications and                            allergies were reviewed. The patient's tolerance of                            previous anesthesia was also reviewed. The risks                            and benefits of the procedure and the sedation                            options and risks were discussed with the patient.                            All questions were answered, and informed consent                            was obtained. Prior Anticoagulants: The patient has                            taken no previous anticoagulant or antiplatelet  agents. ASA Grade Assessment: II - A patient with                            mild systemic disease. After reviewing the risks                            and benefits, the patient was deemed in                            satisfactory condition to undergo the procedure.                           After obtaining informed consent, the  endoscope was                            passed under direct vision. Throughout the                            procedure, the patient's blood pressure, pulse, and                            oxygen saturations were monitored continuously. The                            EG-[id number] ([id number]) scope was introduced through the                            mouth, and advanced to the second part of duodenum.                            The upper GI endoscopy was accomplished without                            difficulty. The patient tolerated the procedure                            well. Scope In: Scope Out: Findings:      LA Grade B (one or more mucosal breaks greater than 5 mm, not extending       between the tops of two mucosal folds) esophagitis with mild oozing/heme       was found 34 to 35 cm from the incisors.      Two non-obstructing non-bleeding cratered gastric ulcers with a visible       vessel were found in the gastric antrum. The largest lesion was 6 mm in       largest dimension. Random biopsies were taken with a cold forceps from       gastric antrum and body for Helicobacter pylori testing. Coagulation for       bleeding prevention from the visible vessel using bipolar probe was       successful.      The examined duodenum was normal. Impression:               - LA Grade B erosive esophagitis.                           -  Non-obstructing non-bleeding gastric ulcers with                            a visible vessel. Suspicious for H. pylori induced                            etiology. Biopsied. Treated with bipolar cautery.                           - Normal examined duodenum. Moderate Sedation:      N/A- Per Anesthesia Care Recommendation:           - Patient has a contact number available for                            emergencies. The signs and symptoms of potential                            delayed complications were discussed with the                            patient. Return  to normal activities tomorrow.                            Written discharge instructions were provided to the                            patient.                           - Resume previous diet.                           - Continue present medications.                           - Await pathology results and treat H.pylori if                            positive.                           - No ibuprofen, naproxen, or other non-steroidal                            anti-inflammatory drugs.                           - Use Prilosec (omeprazole) 40 mg PO BID for 3                            months followed by Prilosec 40mg  daily.                           - Repeat upper endoscopy in 3 months to check  healing of the gastric ulcer and repeat biopsies if                            needed.                           - Repeat Hgb in 2 weeks                           - Follow up in GI office in 2 months                           - Continue oral ferrous sulphate 325mg  BID with                            meals Procedure Code(s):        --- Professional ---                           14431, 59, Esophagogastroduodenoscopy, flexible,                            transoral; with control of bleeding, any method                           43239, Esophagogastroduodenoscopy, flexible,                            transoral; with biopsy, single or multiple Diagnosis Code(s):        --- Professional ---                           K20.8, Other esophagitis                           K25.4, Chronic or unspecified gastric ulcer with                            hemorrhage                           K92.2, Gastrointestinal hemorrhage, unspecified                           D50.9, Iron deficiency anemia, unspecified CPT copyright 2017 American Medical Association. All rights reserved. The codes documented in this report are preliminary and upon coder review may  be revised to meet current compliance  requirements. Mauri Pole, MD 10/30/2017 9:21:43 AM This report has been signed electronically. Number of Addenda: 0

## 2017-10-30 NOTE — Transfer of Care (Signed)
Immediate Anesthesia Transfer of Care Note  Patient: Anita Holland  Procedure(s) Performed: ESOPHAGOGASTRODUODENOSCOPY (EGD) (N/A )  Patient Location: PACU  Anesthesia Type:MAC  Level of Consciousness: awake, alert , oriented and patient cooperative  Airway & Oxygen Therapy: Patient Spontanous Breathing and Patient connected to nasal cannula oxygen  Post-op Assessment: Report given to RN, Post -op Vital signs reviewed and stable and Patient moving all extremities X 4  Post vital signs: stable  Last Vitals:  Vitals Value Taken Time  BP 98/66 10/30/2017  9:21 AM  Temp    Pulse 110 10/30/2017  9:23 AM  Resp 25 10/30/2017  9:23 AM  SpO2 100 % 10/30/2017  9:23 AM  Vitals shown include unvalidated device data.  Last Pain:  Vitals:   10/30/17 0921  TempSrc:   PainSc: 0-No pain         Complications: No apparent anesthesia complications

## 2017-10-30 NOTE — Anesthesia Postprocedure Evaluation (Signed)
Anesthesia Post Note  Patient: Anita Holland  Procedure(s) Performed: ESOPHAGOGASTRODUODENOSCOPY (EGD) (N/A )     Patient location during evaluation: PACU Anesthesia Type: MAC Level of consciousness: awake and alert Pain management: pain level controlled Vital Signs Assessment: post-procedure vital signs reviewed and stable Respiratory status: spontaneous breathing, nonlabored ventilation, respiratory function stable and patient connected to nasal cannula oxygen Cardiovascular status: stable and blood pressure returned to baseline Postop Assessment: no apparent nausea or vomiting Anesthetic complications: no    Last Vitals:  Vitals:   10/30/17 0759 10/30/17 0921  BP: 132/84   Pulse: 89 (!) 109  Resp: (!) 25 (!) 22  Temp: 36.7 C 36.9 C  SpO2: 100% 100%    Last Pain:  Vitals:   10/30/17 0921  TempSrc: Oral  PainSc: 0-No pain                 Mohogany Toppins S

## 2017-10-31 LAB — CBC WITH DIFFERENTIAL/PLATELET
BASOS PCT: 1 %
Basophils Absolute: 0 10*3/uL (ref 0.0–0.1)
Eosinophils Absolute: 0.4 10*3/uL (ref 0.0–0.7)
Eosinophils Relative: 7 %
HEMATOCRIT: 30.9 % — AB (ref 36.0–46.0)
Hemoglobin: 10 g/dL — ABNORMAL LOW (ref 12.0–15.0)
Lymphocytes Relative: 21 %
Lymphs Abs: 1.3 10*3/uL (ref 0.7–4.0)
MCH: 28.1 pg (ref 26.0–34.0)
MCHC: 32.4 g/dL (ref 30.0–36.0)
MCV: 86.8 fL (ref 78.0–100.0)
MONO ABS: 0.4 10*3/uL (ref 0.1–1.0)
Monocytes Relative: 7 %
NEUTROS ABS: 4 10*3/uL (ref 1.7–7.7)
NEUTROS PCT: 64 %
Platelets: 308 10*3/uL (ref 150–400)
RBC: 3.56 MIL/uL — ABNORMAL LOW (ref 3.87–5.11)
RDW: 17.1 % — AB (ref 11.5–15.5)
WBC: 6.2 10*3/uL (ref 4.0–10.5)

## 2017-10-31 MED ORDER — ONDANSETRON HCL 4 MG PO TABS: 4.0000 mg | ORAL_TABLET | Freq: Four times a day (QID) | ORAL | 0 refills | Status: DC | PRN

## 2017-10-31 MED ORDER — PANTOPRAZOLE SODIUM 40 MG PO TBEC: 40.0000 mg | DELAYED_RELEASE_TABLET | Freq: Two times a day (BID) | ORAL | 3 refills | Status: DC

## 2017-10-31 MED ORDER — FERROUS SULFATE 325 (65 FE) MG PO TABS: 325.0000 mg | ORAL_TABLET | Freq: Two times a day (BID) | ORAL | 3 refills | Status: DC

## 2017-10-31 MED ORDER — FERROUS SULFATE 325 (65 FE) MG PO TABS: 325.0000 mg | ORAL_TABLET | Freq: Two times a day (BID) | ORAL | Status: DC

## 2017-10-31 NOTE — Progress Notes (Signed)
Patient given discharge, follow up, and medication instructions, verbalized understanding, IV and telemetry monitor removed, personal belongings with patient, family to transport home  

## 2017-10-31 NOTE — Discharge Summary (Signed)
Physician Discharge Summary  Anita Holland JOA:[id number] DOB: [birth year] DOA: 10/28/2017  PCP: Girtha Rm, NP-C  Admit date: 10/28/2017 Discharge date: 10/31/2017  Admitted From: Home.  Disposition:  Home.   Recommendations for Outpatient Follow-up:  1. Follow up with PCP in 1-2 weeks 2. Please obtain BMP/CBC in one week Please follow up with GI in 2 months.  Please follow up with h pylori results.   Discharge Condition:stable.  CODE STATUS: full code.  Diet recommendation: Heart Healthy   Brief/Interim Summary: Anita Holland 52 year old female with no significant medical history presented to the emergency department complaining of nausea, bloody emesis and black stools.  Patient report that 3 days ago has been vomiting after eating [place] food.  Emesis is nonbloody.  Associated symptoms include diarrhea with dark black stools.  Upon ED evaluation was found to be 7.9, BUN 25, creatinine stable, tachycardic and blood pressure stable.  Patient was admitted with working diagnosis of upper GI bleed. She received 2 units of PRBC, hemoglobin responded as expected. hemoglobin stable around 10.  Status post EGD found LA grade B erosive esophagitis, gastric ulcer with a visible vessel.  Suspicious for H. pylori.  Biopsy was taken and bipolar cautery applied.  GI recommending Prilosec 40 mg twice daily for 60-month then 40 mg daily.      Discharge Diagnoses:  Active Problems:   GERD (gastroesophageal reflux disease)   Hematemesis   Symptomatic anemia   Hypokalemia   Upper GI bleed   Acute GI bleeding   Melena  Acute GI bleeding, acute anemia of blood loss:  GI consulted and she underwent 2 units of PRBC, hemoglobin responded as expected.  Today 9.7. Status post EGD found LA grade B erosive esophagitis, gastric ulcer with a visible vessel.  Suspicious for H. pylori.  Biopsy was taken and bipolar cautery applied.  GI recommending Prilosec 40 mg twice daily for 51-month then 40 mg daily.   Added fe sulfate.    GERD ; PPI.   Hypokalemia:  Replaced.    Hypomagnesemia Replaced.       Discharge Instructions  Discharge Instructions    Diet - low sodium heart healthy   Complete by:  As directed    Discharge instructions   Complete by:  As directed    Please follow up with gastroenterology as recommended to follow up H pylori results.  Please follow up with PCP to check hemoglobin.   Discharge instructions   Complete by:  As directed    Please check your cbc in 2 weeks at PCP office.     Allergies as of 10/31/2017   No Known Allergies     Medication List    STOP taking these medications   diphenhydramine-acetaminophen 25-500 MG Tabs tablet Commonly known as:  TYLENOL PM   doxycycline 100 MG capsule Commonly known as:  VIBRAMYCIN     TAKE these medications   ferrous sulfate 325 (65 FE) MG tablet Take 1 tablet (325 mg total) by mouth 2 (two) times daily with a meal.   ondansetron 4 MG tablet Commonly known as:  ZOFRAN Take 1 tablet (4 mg total) by mouth every 6 (six) hours as needed for nausea.   pantoprazole 40 MG tablet Commonly known as:  PROTONIX Take 1 tablet (40 mg total) by mouth 2 (two) times daily before a meal.      Follow-up Information    Henson, Vickie L, NP-C. Schedule an appointment as soon as possible for a visit in 1  week(s).   Specialty:  Family Medicine Contact information: [place] [address] [phone]        Mauri Pole, MD Follow up in 2 month(s).   Specialty:  Gastroenterology Contact information: [place] [id number] [phone]          No Known Allergies  Consultations:  Gastroenterology.    Procedures/Studies: Ct Abdomen Pelvis W Contrast  Result Date: 10/28/2017 CLINICAL DATA:  Nausea, vomiting. EXAM: CT ABDOMEN AND PELVIS WITH CONTRAST TECHNIQUE: Multidetector CT imaging of the abdomen and pelvis was performed using the standard protocol  following bolus administration of intravenous contrast. CONTRAST:  100 mL ISOVUE-300 IOPAMIDOL (ISOVUE-300) INJECTION 61% COMPARISON:  None. FINDINGS: Lower chest: No acute abnormality. Hepatobiliary: No focal liver abnormality is seen. No gallstones, gallbladder wall thickening, or biliary dilatation. Pancreas: Unremarkable. No pancreatic ductal dilatation or surrounding inflammatory changes. Spleen: Normal in size without focal abnormality. Adrenals/Urinary Tract: Adrenal glands appear normal. Left kidney and ureter are unremarkable. Nonobstructive right nephrolithiasis is noted. Mild right hydronephrosis is noted without obstructing calculus or ureteral dilatation. Urinary bladder is unremarkable. Stomach/Bowel: Stomach is within normal limits. Appendix appears normal. No evidence of bowel wall thickening, distention, or inflammatory changes. Vascular/Lymphatic: No significant vascular findings are present. No enlarged abdominal or pelvic lymph nodes. Reproductive: 5.6 cm uterine fibroid is noted. No adnexal abnormality is noted. Other: No abdominal wall hernia or abnormality. No abdominopelvic ascites. Musculoskeletal: No acute or significant osseous findings. IMPRESSION: Nonobstructive right nephrolithiasis is noted. 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 5.6 cm uterine fibroid is noted. Electronically Signed   By: Marijo Conception, M.D.   On: 10/28/2017 23:42        Subjective: Some nausea, which improved with anti emetics.   Discharge Exam: Vitals:   10/30/17 2225 10/31/17 0626  BP: 99/73 106/79  Pulse: 95 81  Resp: 18 18  Temp: 98.3 F (36.8 C) 98.4 F (36.9 C)  SpO2: 99% 97%   Vitals:   10/30/17 0930 10/30/17 1621 10/30/17 2225 10/31/17 0626  BP: 108/85 106/78 99/73 106/79  Pulse: (!) 105 (!) 107 95 81  Resp: (!) 21 16 18 18   Temp:  99.8 F (37.7 C) 98.3 F (36.8 C) 98.4 F (36.9 C)   TempSrc:  Oral  Oral  SpO2: 100% 96% 99% 97%  Weight:      Height:        General: Pt is alert, awake, not in acute distress Cardiovascular: RRR, S1/S2 +, no rubs, no gallops Respiratory: CTA bilaterally, no wheezing, no rhonchi Abdominal: Soft, NT, ND, bowel sounds + Extremities: no edema, no cyanosis    The results of significant diagnostics from this hospitalization (including imaging, microbiology, ancillary and laboratory) are listed below for reference.     Microbiology: No results found for this or any previous visit (from the past 240 hour(s)).   Labs: BNP (last 3 results) No results for input(s): BNP in the last 8760 hours. Basic Metabolic Panel: Recent Labs  Lab 10/28/17 2011 10/29/17 0544 10/30/17 0552  NA 139 142 141  K 3.2* 3.6 4.0  CL 110 113* 112*  CO2 21* 21* 23  GLUCOSE 127* 103* 102*  BUN 25* 13 5*  CREATININE 0.63 0.53 0.53  CALCIUM 8.4* 8.0* 8.7*  MG 1.6* 1.6* 2.0  PHOS  --  2.7  --    Liver Function Tests: Recent Labs  Lab 10/28/17 2011 10/29/17 0544  AST 15 14*  ALT 13* 13*  ALKPHOS 29* 25*  BILITOT 0.1* 0.5  PROT 6.0* 5.0*  ALBUMIN 3.5 2.9*   Recent Labs  Lab 10/28/17 2011  LIPASE 25   No results for input(s): AMMONIA in the last 168 hours. CBC: Recent Labs  Lab 10/28/17 2011  10/29/17 1108 10/29/17 1705 10/29/17 2256 10/30/17 0552 10/31/17 0507  WBC 9.1   < > 5.9 6.7 6.8 5.8 6.2  NEUTROABS 6.3  --   --   --   --   --  4.0  HGB 7.9*   < > 8.3* 9.8* 9.9* 9.7* 10.0*  HCT 24.7*   < > 26.1* 29.7* 29.7* 30.4* 30.9*  MCV 89.5   < > 85.9 85.8 85.8 86.9 86.8  PLT 319   < > 256 264 287 268 308   < > = values in this interval not displayed.   Cardiac Enzymes: No results for input(s): CKTOTAL, CKMB, CKMBINDEX, TROPONINI in the last 168 hours. BNP: Invalid input(s): POCBNP CBG: No results for input(s): GLUCAP in the last 168 hours. D-Dimer No results for input(s): DDIMER in the last 72 hours. Hgb A1c No results for  input(s): HGBA1C in the last 72 hours. Lipid Profile No results for input(s): CHOL, HDL, LDLCALC, TRIG, CHOLHDL, LDLDIRECT in the last 72 hours. Thyroid function studies Recent Labs    10/29/17 0544  TSH 2.514   Anemia work up Recent Labs    10/28/17 2100  VITAMINB12 426  FOLATE 7.9  FERRITIN 5*  TIBC 381  IRON 90  RETICCTPCT 4.0*   Urinalysis    Component Value Date/Time   COLORURINE STRAW (A) 10/28/2017 2127   APPEARANCEUR CLEAR 10/28/2017 2127   LABSPEC 1.003 (L) 10/28/2017 2127   LABSPEC 1.015 05/02/2017 1528   PHURINE 6.0 10/28/2017 2127   GLUCOSEU NEGATIVE 10/28/2017 2127   HGBUR LARGE (A) 10/28/2017 2127   BILIRUBINUR NEGATIVE 10/28/2017 2127   BILIRUBINUR negative 05/02/2017 1528   BILIRUBINUR n 05/01/2016 [place] 10/28/2017 2127   PROTEINUR NEGATIVE 10/28/2017 2127   UROBILINOGEN negative 05/01/2016 1637   NITRITE NEGATIVE 10/28/2017 2127   LEUKOCYTESUR NEGATIVE 10/28/2017 2127   Sepsis Labs Invalid input(s): PROCALCITONIN,  WBC,  LACTICIDVEN Microbiology No results found for this or any previous visit (from the past 240 hour(s)).   Time coordinating discharge: 35 minutes  SIGNED:   Hosie Poisson, MD  [facility] 10/31/2017, 7:02 PM Pager   If 7PM-7AM, please contact night-coverage [email or web address] Password [email or web address]

## 2017-11-01 ENCOUNTER — Encounter (HOSPITAL_COMMUNITY): Payer: Self-pay | Admitting: Gastroenterology

## 2017-11-08 ENCOUNTER — Other Ambulatory Visit: Payer: Self-pay

## 2017-11-08 ENCOUNTER — Telehealth: Payer: Self-pay | Admitting: Gastroenterology

## 2017-11-08 MED ORDER — BIS SUBCIT-METRONID-TETRACYC 140-125-125 MG PO CAPS: 3.0000 | ORAL_CAPSULE | Freq: Three times a day (TID) | ORAL | 0 refills | Status: DC

## 2017-11-08 MED ORDER — BISMUTH SUBSALICYLATE 262 MG PO CHEW: 524.0000 mg | CHEWABLE_TABLET | Freq: Three times a day (TID) | ORAL | 0 refills | Status: DC

## 2017-11-08 MED ORDER — METRONIDAZOLE 250 MG PO TABS: 250.0000 mg | ORAL_TABLET | Freq: Four times a day (QID) | ORAL | 0 refills | Status: DC

## 2017-11-08 MED ORDER — DOXYCYCLINE HYCLATE 100 MG PO CAPS: 100.0000 mg | ORAL_CAPSULE | Freq: Two times a day (BID) | ORAL | 0 refills | Status: DC

## 2017-11-08 NOTE — Telephone Encounter (Signed)
Ok thanks 

## 2017-11-08 NOTE — Telephone Encounter (Signed)
Spoke with the patient. The [facility] is $1100.00 She will pick up the prescription for Doxycycline, Flagyl and understands she will also need Pepto-Bismol extra strength 4 times daily.

## 2017-11-13 ENCOUNTER — Encounter: Payer: Self-pay | Admitting: Family Medicine

## 2017-11-13 ENCOUNTER — Ambulatory Visit (INDEPENDENT_AMBULATORY_CARE_PROVIDER_SITE_OTHER): Payer: BLUE CROSS/BLUE SHIELD | Admitting: Family Medicine

## 2017-11-13 VITALS — BP 120/78 | HR 90 | Temp 98.3°F | Resp 16 | Wt 179.6 lb

## 2017-11-13 DIAGNOSIS — D500 Iron deficiency anemia secondary to blood loss (chronic): Secondary | ICD-10-CM | POA: Diagnosis not present

## 2017-11-13 DIAGNOSIS — K219 Gastro-esophageal reflux disease without esophagitis: Secondary | ICD-10-CM | POA: Diagnosis not present

## 2017-11-13 DIAGNOSIS — Z09 Encounter for follow-up examination after completed treatment for conditions other than malignant neoplasm: Secondary | ICD-10-CM | POA: Diagnosis not present

## 2017-11-13 DIAGNOSIS — K5900 Constipation, unspecified: Secondary | ICD-10-CM

## 2017-11-13 DIAGNOSIS — R140 Abdominal distension (gaseous): Secondary | ICD-10-CM | POA: Diagnosis not present

## 2017-11-13 NOTE — Patient Instructions (Addendum)
Start taking Colace or the generic. Take 1-3 daily.   Get Miralax and start using 1 cap full in 8 ounces water or your favorite liquid. You may want to do this every 8 hours for the first 2-3 days and then you can always back off and titrate as needed.   You may also want to try magnesium citrate for more immediate result.   Cut back on gas producing foods as discussed such as salads, broccoli, onion, mushrooms, raw fruits and vegetables, carbonated beverages.   We will call you with your lab results.

## 2017-11-13 NOTE — Progress Notes (Signed)
   Subjective:    Patient ID: Anita Holland, female    DOB: 1965-12-15, 52 y.o.   MRN: [id number]  HPI Chief Complaint  Patient presents with  . hospital follow-up    hospital follow-up, conspitation, was trying to burp after water but couldn't and now feeling nauseated and weak and drained. chest tightness and some SOB   She is here for a hospital follow-up.  She was discharged on Oct 31, 2017 after being evaluated and treated for a GI bleed.  She was found to be quite anemic and received 2 units of packed red blood cells. Her hemoglobin did improve prior to discharge.  She is currently taking doxycycline, metronidazole, liquid iron, and pantoprazole twice daily.  She has Zofran to take as needed for nausea but has not been taking it.   She reports having severe constipation. Last bowel movement was 4 days ago. She is feeling bloated. States reflux has been severe.  She has a follow up with GI scheduled.   Denies fever, chills, dizziness, chest pain, palpitations, shortness of breath, abdominal pain, vomiting or diarrhea.   Reviewed allergies, medications, past medical, surgical, family, and social history.    Review of Systems Pertinent positives and negatives in the history of present illness.     Objective:   Physical Exam BP 120/78   Pulse 90   Temp 98.3 F (36.8 C) (Oral)   Resp 16   Wt 179 lb 9.6 oz (81.5 kg)   LMP 10/26/2017   SpO2 97%   BMI 31.81 kg/m   Alert and in no distress. Pharyngeal area is normal. Neck is supple without adenopathy or thyromegaly. Cardiac exam shows a regular sinus rhythm without murmurs or gallops. Lungs are clear to auscultation. Abdomen is slightly distended, normal BS throughout, diffusely tender without rebound or guarding. No rigidity. Skin is warm and dry, no pallor.        Assessment & Plan:  Hospital discharge follow-up - Plan: CBC with Differential/Platelet, Comprehensive metabolic panel  Constipation, unspecified constipation  type - Plan: CBC with Differential/Platelet, Comprehensive metabolic panel  Gastroesophageal reflux disease, esophagitis presence not specified  Iron deficiency anemia due to chronic blood loss - Plan: CBC with Differential/Platelet, Comprehensive metabolic panel  Abdominal bloating - Plan: CBC with Differential/Platelet, Comprehensive metabolic panel  Reviewed discharge summary, labs, and imaging results from hospital stay.  Counseling done on constipation most likely due to oral iron and medications.  Continue on current medication regimen and follow up if not improving.  She will start on stool softeners, Miralax and titrate as needed.  Advised to eat a soft diet and avoid gas producing foods and beverages.  Stay well hydrated.  She will follow up if symptoms worsen or if she is not having success with current recommendations for constipation.

## 2017-11-14 LAB — CBC WITH DIFFERENTIAL/PLATELET
BASOS ABS: 0 10*3/uL (ref 0.0–0.2)
BASOS: 0 %
EOS (ABSOLUTE): 0.6 10*3/uL — AB (ref 0.0–0.4)
Eos: 9 %
HEMOGLOBIN: 11 g/dL — AB (ref 11.1–15.9)
Hematocrit: 34 % (ref 34.0–46.6)
Immature Grans (Abs): 0 10*3/uL (ref 0.0–0.1)
Immature Granulocytes: 0 %
LYMPHS ABS: 1.7 10*3/uL (ref 0.7–3.1)
Lymphs: 25 %
MCH: 26.8 pg (ref 26.6–33.0)
MCHC: 32.4 g/dL (ref 31.5–35.7)
MCV: 83 fL (ref 79–97)
Monocytes Absolute: 0.8 10*3/uL (ref 0.1–0.9)
Monocytes: 12 %
Neutrophils Absolute: 3.7 10*3/uL (ref 1.4–7.0)
Neutrophils: 54 %
PLATELETS: 403 10*3/uL (ref 150–450)
RBC: 4.11 x10E6/uL (ref 3.77–5.28)
RDW: 16.3 % — ABNORMAL HIGH (ref 12.3–15.4)
WBC: 6.8 10*3/uL (ref 3.4–10.8)

## 2017-11-14 LAB — COMPREHENSIVE METABOLIC PANEL
A/G RATIO: 2.1 (ref 1.2–2.2)
ALBUMIN: 4.5 g/dL (ref 3.5–5.5)
ALK PHOS: 42 IU/L (ref 39–117)
ALT: 14 IU/L (ref 0–32)
AST: 17 IU/L (ref 0–40)
BILIRUBIN TOTAL: 0.4 mg/dL (ref 0.0–1.2)
BUN / CREAT RATIO: 14 (ref 9–23)
BUN: 9 mg/dL (ref 6–24)
CHLORIDE: 104 mmol/L (ref 96–106)
CO2: 21 mmol/L (ref 20–29)
Calcium: 9.7 mg/dL (ref 8.7–10.2)
Creatinine, Ser: 0.66 mg/dL (ref 0.57–1.00)
GFR calc non Af Amer: 103 mL/min/{1.73_m2} (ref >59)
GFR, EST AFRICAN AMERICAN: 119 mL/min/{1.73_m2} (ref >59)
GLUCOSE: 81 mg/dL (ref 65–99)
Globulin, Total: 2.1 g/dL (ref 1.5–4.5)
POTASSIUM: 4.1 mmol/L (ref 3.5–5.2)
Sodium: 141 mmol/L (ref 134–144)
TOTAL PROTEIN: 6.6 g/dL (ref 6.0–8.5)

## 2017-11-15 ENCOUNTER — Inpatient Hospital Stay: Payer: BLUE CROSS/BLUE SHIELD | Admitting: Family Medicine

## 2017-11-29 ENCOUNTER — Telehealth: Payer: Self-pay | Admitting: Internal Medicine

## 2017-11-29 NOTE — Telephone Encounter (Signed)
Dr Hilarie Fredrickson, patient is requesting refills of ondanestron. This was sent by a hospitalist at the time of her hospitalization recently (10/31/17) for upper GI bleed, gastric ulcers. On biopsies, patient was found to have H Pylori. Patient states that she has taken all of her antibiotics at this time but is still having some nausea. No vomiting, no notable abdominal pain. She is taking iron supplement so having some dark stool. Continues her pantoprazole as directed. She was to see GI in office but hadnt yet been scheduled for visit so she has now been set up to see Janett Billow on 01/13/18 for follow up.   May I refill ondansetron under your name?

## 2017-11-29 NOTE — Telephone Encounter (Signed)
Pt is calling back in regards of this asking for a call back. Call back # [phone].

## 2017-12-03 MED ORDER — ONDANSETRON HCL 4 MG PO TABS: 4.0000 mg | ORAL_TABLET | Freq: Four times a day (QID) | ORAL | 0 refills | Status: DC | PRN

## 2017-12-03 NOTE — Telephone Encounter (Signed)
yes

## 2017-12-03 NOTE — Telephone Encounter (Signed)
Rx sent for ondansetron to [facility]. Patient advised.

## 2018-01-04 ENCOUNTER — Encounter: Payer: Self-pay | Admitting: Gastroenterology

## 2018-01-04 ENCOUNTER — Encounter (INDEPENDENT_AMBULATORY_CARE_PROVIDER_SITE_OTHER): Payer: Self-pay

## 2018-01-04 ENCOUNTER — Other Ambulatory Visit (INDEPENDENT_AMBULATORY_CARE_PROVIDER_SITE_OTHER): Payer: BLUE CROSS/BLUE SHIELD

## 2018-01-04 ENCOUNTER — Ambulatory Visit (INDEPENDENT_AMBULATORY_CARE_PROVIDER_SITE_OTHER): Payer: BLUE CROSS/BLUE SHIELD | Admitting: Gastroenterology

## 2018-01-04 VITALS — BP 112/68 | HR 72 | Ht 63.0 in | Wt 175.4 lb

## 2018-01-04 DIAGNOSIS — Z8719 Personal history of other diseases of the digestive system: Secondary | ICD-10-CM

## 2018-01-04 DIAGNOSIS — Z8711 Personal history of peptic ulcer disease: Secondary | ICD-10-CM

## 2018-01-04 DIAGNOSIS — A048 Other specified bacterial intestinal infections: Secondary | ICD-10-CM | POA: Diagnosis not present

## 2018-01-04 DIAGNOSIS — D509 Iron deficiency anemia, unspecified: Secondary | ICD-10-CM | POA: Diagnosis not present

## 2018-01-04 LAB — IBC PANEL
IRON: 29 ug/dL — AB (ref 42–145)
Saturation Ratios: 5.5 % — ABNORMAL LOW (ref 20.0–50.0)
TRANSFERRIN: 377 mg/dL — AB (ref 212.0–360.0)

## 2018-01-04 LAB — CBC
HCT: 37.5 % (ref 36.0–46.0)
Hemoglobin: 11.8 g/dL — ABNORMAL LOW (ref 12.0–15.0)
MCHC: 31.5 g/dL (ref 30.0–36.0)
MCV: 76.6 fl — AB (ref 78.0–100.0)
Platelets: 366 10*3/uL (ref 150.0–400.0)
RBC: 4.89 Mil/uL (ref 3.87–5.11)
RDW: 17.9 % — ABNORMAL HIGH (ref 11.5–15.5)
WBC: 6.8 10*3/uL (ref 4.0–10.5)

## 2018-01-04 LAB — FERRITIN: Ferritin: 2 ng/mL — ABNORMAL LOW (ref 10.0–291.0)

## 2018-01-04 MED ORDER — ONDANSETRON HCL 4 MG PO TABS: 4.0000 mg | ORAL_TABLET | Freq: Four times a day (QID) | ORAL | 0 refills | Status: DC | PRN

## 2018-01-04 NOTE — Patient Instructions (Signed)
Your provider has requested that you go to the basement level for lab work before leaving today. Press "B" on the elevator. The lab is located at the first door on the left as you exit the elevator.  We have sent the following medications to your pharmacy for you to pick up at your convenience: Zofran 4 mg every 6 hours as needed  You can use over the counter Zantac 150 mg twice a day for now, then resume Protonix after stool culture.   Turn in stool test on 01/14/18 or after.   You have been scheduled for an endoscopy. Please follow written instructions given to you at your visit today. If you use inhalers (even only as needed), please bring them with you on the day of your procedure. Your physician has requested that you go to www.[email or web address] and enter the access code given to you at your visit today. This web site gives a general overview about your procedure. However, you should still follow specific instructions given to you by our office regarding your preparation for the procedure.

## 2018-01-04 NOTE — Progress Notes (Addendum)
01/04/2018 Anita Holland [id number] [birth year]   HISTORY OF PRESENT ILLNESS:  This is a 52 year old female who is a patient of Dr. Vena Rua.  Was admitted to the hospital 5/6 to 5/8 for UGI bleed.   EGD revealed the following:  - LA Grade B erosive esophagitis. - Non-obstructing non-bleeding gastric ulcers with a visible vessel. Suspicious for H. pylori induced etiology. Biopsied. Treated with bipolar cautery. - Normal examined duodenum.  Was positive for Hpylori.  She completed Pylera treatment for that.  Has been off of PPI for the past 2 weeks because she was told that she need to be off of it before we do the test to recheck for Hpylori.  Since being off of PPI she has been having more nausea and reflux.  No sign of bleeding.  Also complains of a lot of fatigue.  Is taking ferrous sulfate 325 mg daily.  Last Hgb 5/21 was 11 grams.  Past Medical History:  Diagnosis Date  . Allergy   . Arthritis   . GERD (gastroesophageal reflux disease)   . 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  Colposcopy showed inflammatory changes.  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  Recommend follow-up Pap smear 1 year   Past Surgical History:  Procedure Laterality Date  . DILATION AND CURETTAGE OF UTERUS    . ESOPHAGOGASTRODUODENOSCOPY N/A 10/30/2017   Procedure: ESOPHAGOGASTRODUODENOSCOPY (EGD);  Surgeon: Mauri Pole, MD;  Location: [facility];  Service: Endoscopy;  Laterality: N/A;    reports that she has never smoked. She has never used smokeless tobacco. She reports that she does not drink alcohol or use drugs. family history includes Depression in her sister; Heart attack (age of onset: 74) in her sister; Hypertension in her sister; Stroke (age of onset: 62) in her sister. No Known Allergies    Outpatient Encounter Medications as of 01/04/2018  Medication Sig  . bismuth subsalicylate (PEPTO-BISMOL) 262 MG chewable tablet Chew 2  tablets (524 mg total) by mouth 4 (four) times daily -  before meals and at bedtime.  . ferrous sulfate 325 (65 FE) MG tablet Take 325 mg by mouth daily with breakfast.  . ondansetron (ZOFRAN) 4 MG tablet Take 1 tablet (4 mg total) by mouth every 6 (six) hours as needed for nausea.  . [DISCONTINUED] doxycycline (VIBRAMYCIN) 100 MG capsule Take 1 capsule (100 mg total) by mouth 2 (two) times daily.  . [DISCONTINUED] ferrous sulfate 325 (65 FE) MG tablet Take 1 tablet (325 mg total) by mouth 2 (two) times daily with a meal.  . [DISCONTINUED] metroNIDAZOLE (FLAGYL) 250 MG tablet Take 1 tablet (250 mg total) by mouth 4 (four) times daily.  . [DISCONTINUED] ondansetron (ZOFRAN) 4 MG tablet Take 1 tablet (4 mg total) by mouth every 6 (six) hours as needed for nausea.  . [DISCONTINUED] pantoprazole (PROTONIX) 40 MG tablet Take 1 tablet (40 mg total) by mouth 2 (two) times daily before a meal.   No facility-administered encounter medications on file as of 01/04/2018.      REVIEW OF SYSTEMS  : All other systems reviewed and negative except where noted in the History of Present Illness.   PHYSICAL EXAM: BP 112/68   Pulse 72   Ht 5\' 3"  (1.6 m)   Wt 175 lb 6 oz (79.5 kg)   BMI 31.07 kg/m  General: Well developed black female in no acute distress Head: Normocephalic and atraumatic Eyes:  Sclerae anicteric, conjunctiva  pink. Ears: Normal auditory acuity Lungs: Clear throughout to auscultation; no increased WOB. Heart: Regular rate and rhythm; no M/R/G. Abdomen: Soft, non-distended.  BS present.  Non-tender. Musculoskeletal: Symmetrical with no gross deformities  Skin: No lesions on visible extremities Extremities: No edema  Neurological: Alert oriented x 4, grossly non-focal Psychological:  Alert and cooperative. Normal mood and affect  ASSESSMENT AND PLAN: *52 year old female with UGI bleed due to gastric ulcers:  Was positive for Hpylori and completed treatment.  Has been off of PPI in order  to do stool Ag, which we should still wait a couple of weeks to do.  In the interim she can take zantac 150 mg BID then once stool study is collected she can resume her daily PPI.  Will renew her zofran for nausea.  Will be scheduled for repeat EGD with Dr. Hilarie Fredrickson. *Fatigue:  Hgb had been increasing well previously.  Will recheck Hgb today along with iron studies and Vitamin D levels.  Has been taking iron supplements once daily.    **The risks, benefits, and alternatives to EGD were discussed with the patient and she consents to proceed.   CC:  Girtha Rm, NP-C   Addendum: Reviewed and agree with management. Pyrtle, Lajuan Lines, MD

## 2018-01-09 LAB — VITAMIN D 1,25 DIHYDROXY
Vitamin D 1, 25 (OH)2 Total: 58 pg/mL (ref 18–72)
Vitamin D2 1, 25 (OH)2: <8 pg/mL
Vitamin D3 1, 25 (OH)2: 58 pg/mL

## 2018-01-11 ENCOUNTER — Other Ambulatory Visit: Payer: Self-pay

## 2018-01-11 DIAGNOSIS — D509 Iron deficiency anemia, unspecified: Secondary | ICD-10-CM

## 2018-01-14 ENCOUNTER — Telehealth: Payer: Self-pay | Admitting: Gastroenterology

## 2018-01-14 ENCOUNTER — Other Ambulatory Visit: Payer: BLUE CROSS/BLUE SHIELD

## 2018-01-14 ENCOUNTER — Other Ambulatory Visit: Payer: Self-pay

## 2018-01-14 DIAGNOSIS — A048 Other specified bacterial intestinal infections: Secondary | ICD-10-CM

## 2018-01-14 NOTE — Telephone Encounter (Signed)
The pt was given the info to call and get her infusion rescheduled to a better time for her.

## 2018-01-15 LAB — HELICOBACTER PYLORI  SPECIAL ANTIGEN
MICRO NUMBER:: 90866101
RESULT:: DETECTED — AB
SPECIMEN QUALITY: ADEQUATE

## 2018-01-17 ENCOUNTER — Encounter (HOSPITAL_COMMUNITY): Payer: Self-pay

## 2018-01-18 ENCOUNTER — Telehealth: Payer: Self-pay

## 2018-01-18 ENCOUNTER — Ambulatory Visit (HOSPITAL_COMMUNITY)
Admission: RE | Admit: 2018-01-18 | Discharge: 2018-01-18 | Disposition: A | Discharge status: Home / Self Care | Payer: BLUE CROSS/BLUE SHIELD | Source: Ambulatory Visit | Attending: Internal Medicine | Admitting: Internal Medicine

## 2018-01-18 DIAGNOSIS — D509 Iron deficiency anemia, unspecified: Secondary | ICD-10-CM | POA: Diagnosis not present

## 2018-01-18 MED ORDER — SODIUM CHLORIDE 0.9 % IV SOLN
Freq: Once | INTRAVENOUS | Status: AC
  Administered 2018-01-18: 11:00:00 via INTRAVENOUS

## 2018-01-18 MED ORDER — SODIUM CHLORIDE 0.9 % IV SOLN
510.0000 mg | INTRAVENOUS | Status: DC
  Administered 2018-01-18: 510 mg via INTRAVENOUS
  Filled 2018-01-18: qty 17

## 2018-01-18 NOTE — Discharge Instructions (Signed)

## 2018-01-18 NOTE — Progress Notes (Addendum)
PATIENT CARE CENTER NOTE  Diagnosis: Iron Deficiency Anemia    Provider: Dr. Zenovia Jarred   Procedure: IV Feraheme    Note: Patient received infusion of Feraheme. Tolerated infusion well. Monitored patient for 30 minutes post transfusion. Patient complained of cramping in abdomen at end of 30 minutes. Called [facility] office and notified Dr. Vena Rua nurse of patient's abdominal cramping. Currently Dr. Hilarie Fredrickson is out of town but the nurse will contact him and inform him of patient's abdominal cramping post Feraheme infusion. [facility] to contact patient and follow up with patient. Patient advised to contact provider if abdominal pain worsens. Vital signs stable. Discharge instructions given. Patient alert, oriented, ambulatory and stable at discharge.

## 2018-01-18 NOTE — Telephone Encounter (Signed)
Received call from pt care center stating that pt received IV iron today and at the 28min post pt c/o abdominal cramping. Pt is due for second infusion next week. Is pt ok to proceed with second tx? Please advise.

## 2018-01-21 NOTE — Telephone Encounter (Signed)
Should be okay to proceed with dose 2 She will be monitored and if recurs the infusion should be stopped (would be an intolerance)

## 2018-01-21 NOTE — Telephone Encounter (Signed)
Spoke with pt and she is aware.

## 2018-01-29 ENCOUNTER — Telehealth: Payer: Self-pay

## 2018-01-30 NOTE — Telephone Encounter (Signed)
Patient states that she spoke to [facility] and was told that she needs to use [facility] for [facility]. She was able to get this done and resolved. She no longer needs our assistance.

## 2018-02-01 ENCOUNTER — Ambulatory Visit (HOSPITAL_COMMUNITY)
Admission: RE | Admit: 2018-02-01 | Discharge: 2018-02-01 | Disposition: A | Discharge status: Home / Self Care | Payer: BLUE CROSS/BLUE SHIELD | Source: Ambulatory Visit | Attending: Internal Medicine | Admitting: Internal Medicine

## 2018-02-01 ENCOUNTER — Other Ambulatory Visit: Payer: Self-pay

## 2018-02-01 DIAGNOSIS — D509 Iron deficiency anemia, unspecified: Secondary | ICD-10-CM

## 2018-02-01 MED ORDER — SODIUM CHLORIDE 0.9 % IV SOLN
510.0000 mg | Freq: Once | INTRAVENOUS | Status: AC
  Administered 2018-02-01: 510 mg via INTRAVENOUS
  Filled 2018-02-01: qty 17

## 2018-02-01 MED ORDER — SODIUM CHLORIDE 0.9 % IV SOLN
Freq: Once | INTRAVENOUS | Status: AC
  Administered 2018-02-01: 250 mL via INTRAVENOUS

## 2018-02-01 NOTE — Progress Notes (Signed)
Patient received Feraheme via PIV. Observed for at least 30 minutes post infusion.Tolerated well, vitals stable, discharge instructions given, verbalized understanding. Patient alert, oriented and ambulatory at the time of discharge.  

## 2018-02-01 NOTE — Discharge Instructions (Signed)

## 2018-02-06 ENCOUNTER — Encounter: Payer: Self-pay | Admitting: Internal Medicine

## 2018-02-06 ENCOUNTER — Ambulatory Visit (AMBULATORY_SURGERY_CENTER): Payer: BLUE CROSS/BLUE SHIELD | Admitting: Internal Medicine

## 2018-02-06 VITALS — BP 129/93 | HR 72 | Temp 98.4°F | Resp 24 | Ht 63.0 in | Wt 175.0 lb

## 2018-02-06 DIAGNOSIS — D500 Iron deficiency anemia secondary to blood loss (chronic): Secondary | ICD-10-CM

## 2018-02-06 DIAGNOSIS — K2940 Chronic atrophic gastritis without bleeding: Secondary | ICD-10-CM

## 2018-02-06 DIAGNOSIS — B9681 Helicobacter pylori [H. pylori] as the cause of diseases classified elsewhere: Secondary | ICD-10-CM | POA: Diagnosis not present

## 2018-02-06 DIAGNOSIS — K259 Gastric ulcer, unspecified as acute or chronic, without hemorrhage or perforation: Secondary | ICD-10-CM

## 2018-02-06 DIAGNOSIS — K2950 Unspecified chronic gastritis without bleeding: Secondary | ICD-10-CM

## 2018-02-06 MED ORDER — SODIUM CHLORIDE 0.9 % IV SOLN: 500.0000 mL | Freq: Once | INTRAVENOUS | Status: DC

## 2018-02-06 NOTE — Progress Notes (Signed)
Called to room to assist during endoscopic procedure.  Patient ID and intended procedure confirmed with present staff. Received instructions for my participation in the procedure from the performing physician.  

## 2018-02-06 NOTE — Patient Instructions (Signed)
Continue present medications. No Ibuprofen, Naproxen, or other non-steriodal anti-inflammatory drugs.    YOU HAD AN ENDOSCOPIC PROCEDURE TODAY AT [facility]:   Refer to the procedure report that was given to you for any specific questions about what was found during the examination.  If the procedure report does not answer your questions, please call your gastroenterologist to clarify.  If you requested that your care partner not be given the details of your procedure findings, then the procedure report has been included in a sealed envelope for you to review at your convenience later.  YOU SHOULD EXPECT: Some feelings of bloating in the abdomen. Passage of more gas than usual.  Walking can help get rid of the air that was put into your GI tract during the procedure and reduce the bloating. If you had a lower endoscopy (such as a colonoscopy or flexible sigmoidoscopy) you may notice spotting of blood in your stool or on the toilet paper. If you underwent a bowel prep for your procedure, you may not have a normal bowel movement for a few days.  Please Note:  You might notice some irritation and congestion in your nose or some drainage.  This is from the oxygen used during your procedure.  There is no need for concern and it should clear up in a day or so.  SYMPTOMS TO REPORT IMMEDIATELY:     Following upper endoscopy (EGD)  Vomiting of blood or coffee ground material  New chest pain or pain under the shoulder blades  Painful or persistently difficult swallowing  New shortness of breath  Fever of 100F or higher  Black, tarry-looking stools  For urgent or emergent issues, a gastroenterologist can be reached at any hour by calling [phone].   DIET:  We do recommend a small meal at first, but then you may proceed to your regular diet.  Drink plenty of fluids but you should avoid alcoholic beverages for 24 hours.  ACTIVITY:  You should plan to take it easy for the rest  of today and you should NOT DRIVE or use heavy machinery until tomorrow (because of the sedation medicines used during the test).    FOLLOW UP: Our staff will call the number listed on your records the next business day following your procedure to check on you and address any questions or concerns that you may have regarding the information given to you following your procedure. If we do not reach you, we will leave a message.  However, if you are feeling well and you are not experiencing any problems, there is no need to return our call.  We will assume that you have returned to your regular daily activities without incident.  If any biopsies were taken you will be contacted by phone or by letter within the next 1-3 weeks.  Please call us at [phone] if you have not heard about the biopsies in 3 weeks.    SIGNATURES/CONFIDENTIALITY: You and/or your care partner have signed paperwork which will be entered into your electronic medical record.  These signatures attest to the fact that that the information above on your After Visit Summary has been reviewed and is understood.  Full responsibility of the confidentiality of this discharge information lies with you and/or your care-partner.

## 2018-02-06 NOTE — Op Note (Signed)
[facility] Patient Name: Anita Holland Procedure Date: 02/06/2018 2:12 PM MRN: [id number] Endoscopist: Jerene Bears , MD Age: 52 Referring MD:  Date of Birth: 11/13/1965 Gender: Female Account #: [id number] Procedure:                Upper GI endoscopy Indications:              Follow-up of acute gastric ulcer causing GI                            bleeding in May 2019, history of H. Pylori status                            post antibiotic therapy Medicines:                Monitored Anesthesia Care Procedure:                Pre-Anesthesia Assessment:                           - Prior to the procedure, a History and Physical                            was performed, and patient medications and                            allergies were reviewed. The patient's tolerance of                            previous anesthesia was also reviewed. The risks                            and benefits of the procedure and the sedation                            options and risks were discussed with the patient.                            All questions were answered, and informed consent                            was obtained. Prior Anticoagulants: The patient has                            taken no previous anticoagulant or antiplatelet                            agents. ASA Grade Assessment: II - A patient with                            mild systemic disease. After reviewing the risks                            and benefits, the patient was deemed in  satisfactory condition to undergo the procedure.                           After obtaining informed consent, the endoscope was                            passed under direct vision. Throughout the                            procedure, the patient's blood pressure, pulse, and                            oxygen saturations were monitored continuously. The                            Endoscope was introduced through the  mouth, and                            advanced to the second part of duodenum. The upper                            GI endoscopy was accomplished without difficulty.                            The patient tolerated the procedure well. Scope In: Scope Out: Findings:                 The examined esophagus was normal.                           One non-bleeding superficial gastric ulcer with no                            stigmata of bleeding was found on the lesser                            curvature of the gastric antrum. The lesion was 8                            mm in largest dimension and now much more                            superficial. Overall this is improved from May                            2019, though not totally healed. This was biopsied                            with a cold forceps for histology.                           The exam of the stomach was otherwise normal.  Biopsies were taken with a cold forceps in the                            gastric body, at the incisura and in the gastric                            antrum for histology and Helicobacter pylori                            testing.                           The examined duodenum was normal. Complications:            No immediate complications. Estimated Blood Loss:     Estimated blood loss was minimal. Impression:               - Normal esophagus.                           - Non-bleeding gastric ulcer with no stigmata of                            bleeding. Improved from May 2019. Biopsied.                           - Normal examined duodenum.                           - Biopsies were taken with a cold forceps for                            histology and Helicobacter pylori testing. Recommendation:           - Patient has a contact number available for                            emergencies. The signs and symptoms of potential                            delayed complications were  discussed with the                            patient. Return to normal activities tomorrow.                            Written discharge instructions were provided to the                            patient.                           - Resume previous diet.                           - Continue present medications.                           -  Continue with pantoprazole 40 mg twice daily                            before 1st and last meal of the day.                           - No ibuprofen, naproxen, or other non-steroidal                            anti-inflammatory drugs.                           - Await pathology results. Jerene Bears, MD 02/06/2018 2:45:24 PM This report has been signed electronically.

## 2018-02-06 NOTE — Progress Notes (Signed)
A and O x3. Report to RN. Tolerated MAC anesthesia well.Teeth unchanged after procedure.

## 2018-02-07 ENCOUNTER — Telehealth: Payer: Self-pay | Admitting: *Deleted

## 2018-02-07 NOTE — Telephone Encounter (Signed)
  Follow up Call-  Call back number 02/06/2018 07/16/2017  Post procedure Call Back phone  # [phone]  Permission to leave phone message Yes Yes  Some recent data might be hidden     Patient questions:  Do you have a fever, pain , or abdominal swelling? No. Pain Score  0 *  Have you tolerated food without any problems? Yes.    Have you been able to return to your normal activities? Yes.    Do you have any questions about your discharge instructions: Diet   No. Medications  No. Follow up visit  No.  Do you have questions or concerns about your Care? No.  Actions: * If pain score is 4 or above: No action needed, pain <4.

## 2018-02-13 ENCOUNTER — Other Ambulatory Visit: Payer: Self-pay

## 2018-02-13 DIAGNOSIS — A048 Other specified bacterial intestinal infections: Secondary | ICD-10-CM

## 2018-02-13 MED ORDER — LEVOFLOXACIN 500 MG PO TABS: 500.0000 mg | ORAL_TABLET | Freq: Every day | ORAL | 0 refills | Status: DC

## 2018-02-13 MED ORDER — AMOXICILLIN 500 MG PO CAPS: 1000.0000 mg | ORAL_CAPSULE | Freq: Two times a day (BID) | ORAL | 0 refills | Status: DC

## 2018-03-01 ENCOUNTER — Telehealth: Payer: Self-pay | Admitting: *Deleted

## 2018-03-01 ENCOUNTER — Telehealth: Payer: Self-pay | Admitting: Internal Medicine

## 2018-03-01 MED ORDER — PANTOPRAZOLE SODIUM 40 MG PO TBEC: 40.0000 mg | DELAYED_RELEASE_TABLET | Freq: Two times a day (BID) | ORAL | 0 refills | Status: DC

## 2018-03-01 NOTE — Telephone Encounter (Signed)
[facility].

## 2018-03-04 NOTE — Telephone Encounter (Signed)
Rx was already sent on 03/01/18 (same day as phone call was taken).

## 2018-03-18 ENCOUNTER — Telehealth: Payer: Self-pay | Admitting: Internal Medicine

## 2018-03-18 NOTE — Telephone Encounter (Signed)
Left message for pt to call back  °

## 2018-03-20 NOTE — Telephone Encounter (Signed)
Spoke with pt and let her know the next step is to recheck her stool for H pylori at the end of October, she verbalized understanding.

## 2018-05-21 ENCOUNTER — Other Ambulatory Visit: Payer: BLUE CROSS/BLUE SHIELD

## 2018-05-27 ENCOUNTER — Other Ambulatory Visit: Payer: Self-pay | Admitting: Internal Medicine

## 2018-05-28 ENCOUNTER — Other Ambulatory Visit: Payer: BLUE CROSS/BLUE SHIELD

## 2018-05-28 DIAGNOSIS — A048 Other specified bacterial intestinal infections: Secondary | ICD-10-CM

## 2018-05-29 LAB — HELICOBACTER PYLORI  SPECIAL ANTIGEN
MICRO NUMBER: 91445909
SPECIMEN QUALITY: ADEQUATE

## 2018-05-30 ENCOUNTER — Telehealth: Payer: Self-pay | Admitting: Family Medicine

## 2018-05-30 NOTE — Telephone Encounter (Deleted)
Dismissal letter in guarantor snapshot  °

## 2018-05-30 NOTE — Telephone Encounter (Signed)
error 

## 2018-05-31 ENCOUNTER — Telehealth: Payer: Self-pay | Admitting: Family Medicine

## 2018-05-31 NOTE — Telephone Encounter (Signed)
Dismissal letter in Guarantor snapshot  °

## 2018-06-03 ENCOUNTER — Encounter: Payer: BLUE CROSS/BLUE SHIELD | Admitting: Family Medicine

## 2018-11-17 IMAGING — CR DG CHEST 2V
2 series · 2 of 2 positions shown · non-contrast
Comparison: None.

CLINICAL DATA: 51-year-old female with left-sided chest pain for 3
days.

EXAM:
CHEST  2 VIEW

[w chest pa]
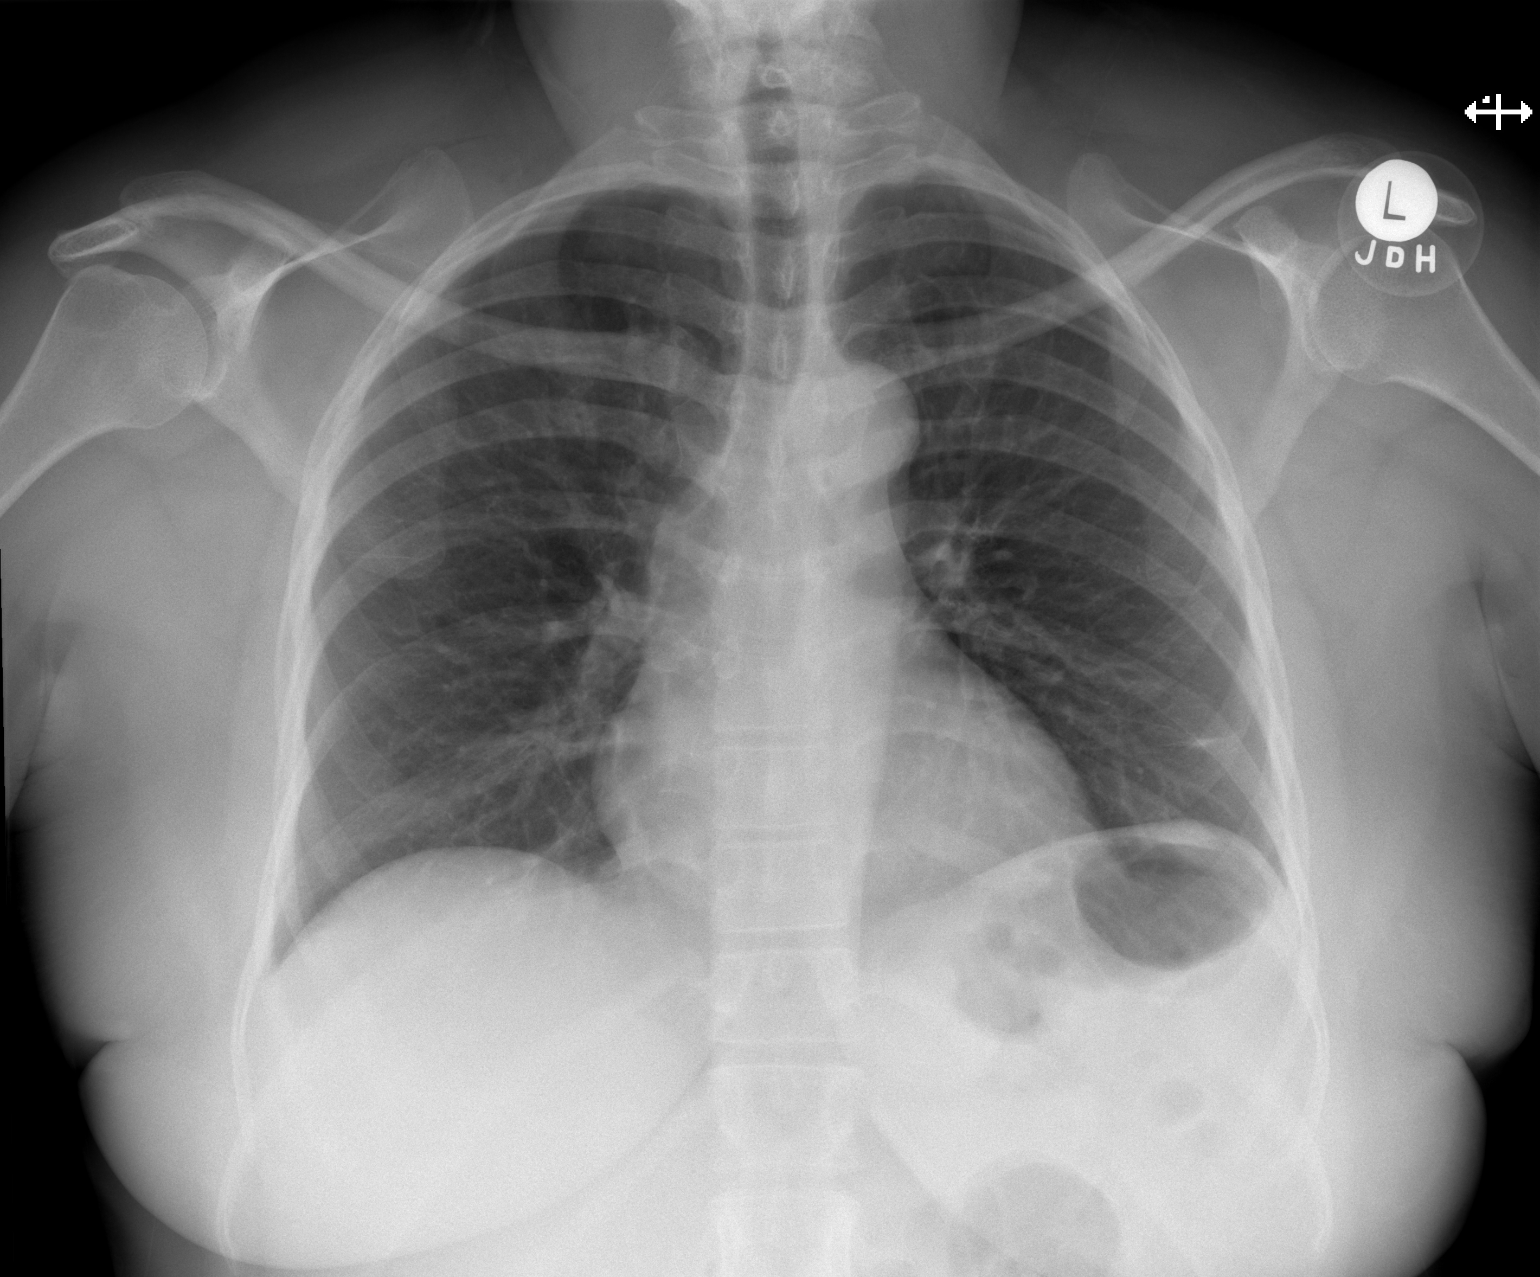

[w chest lat]
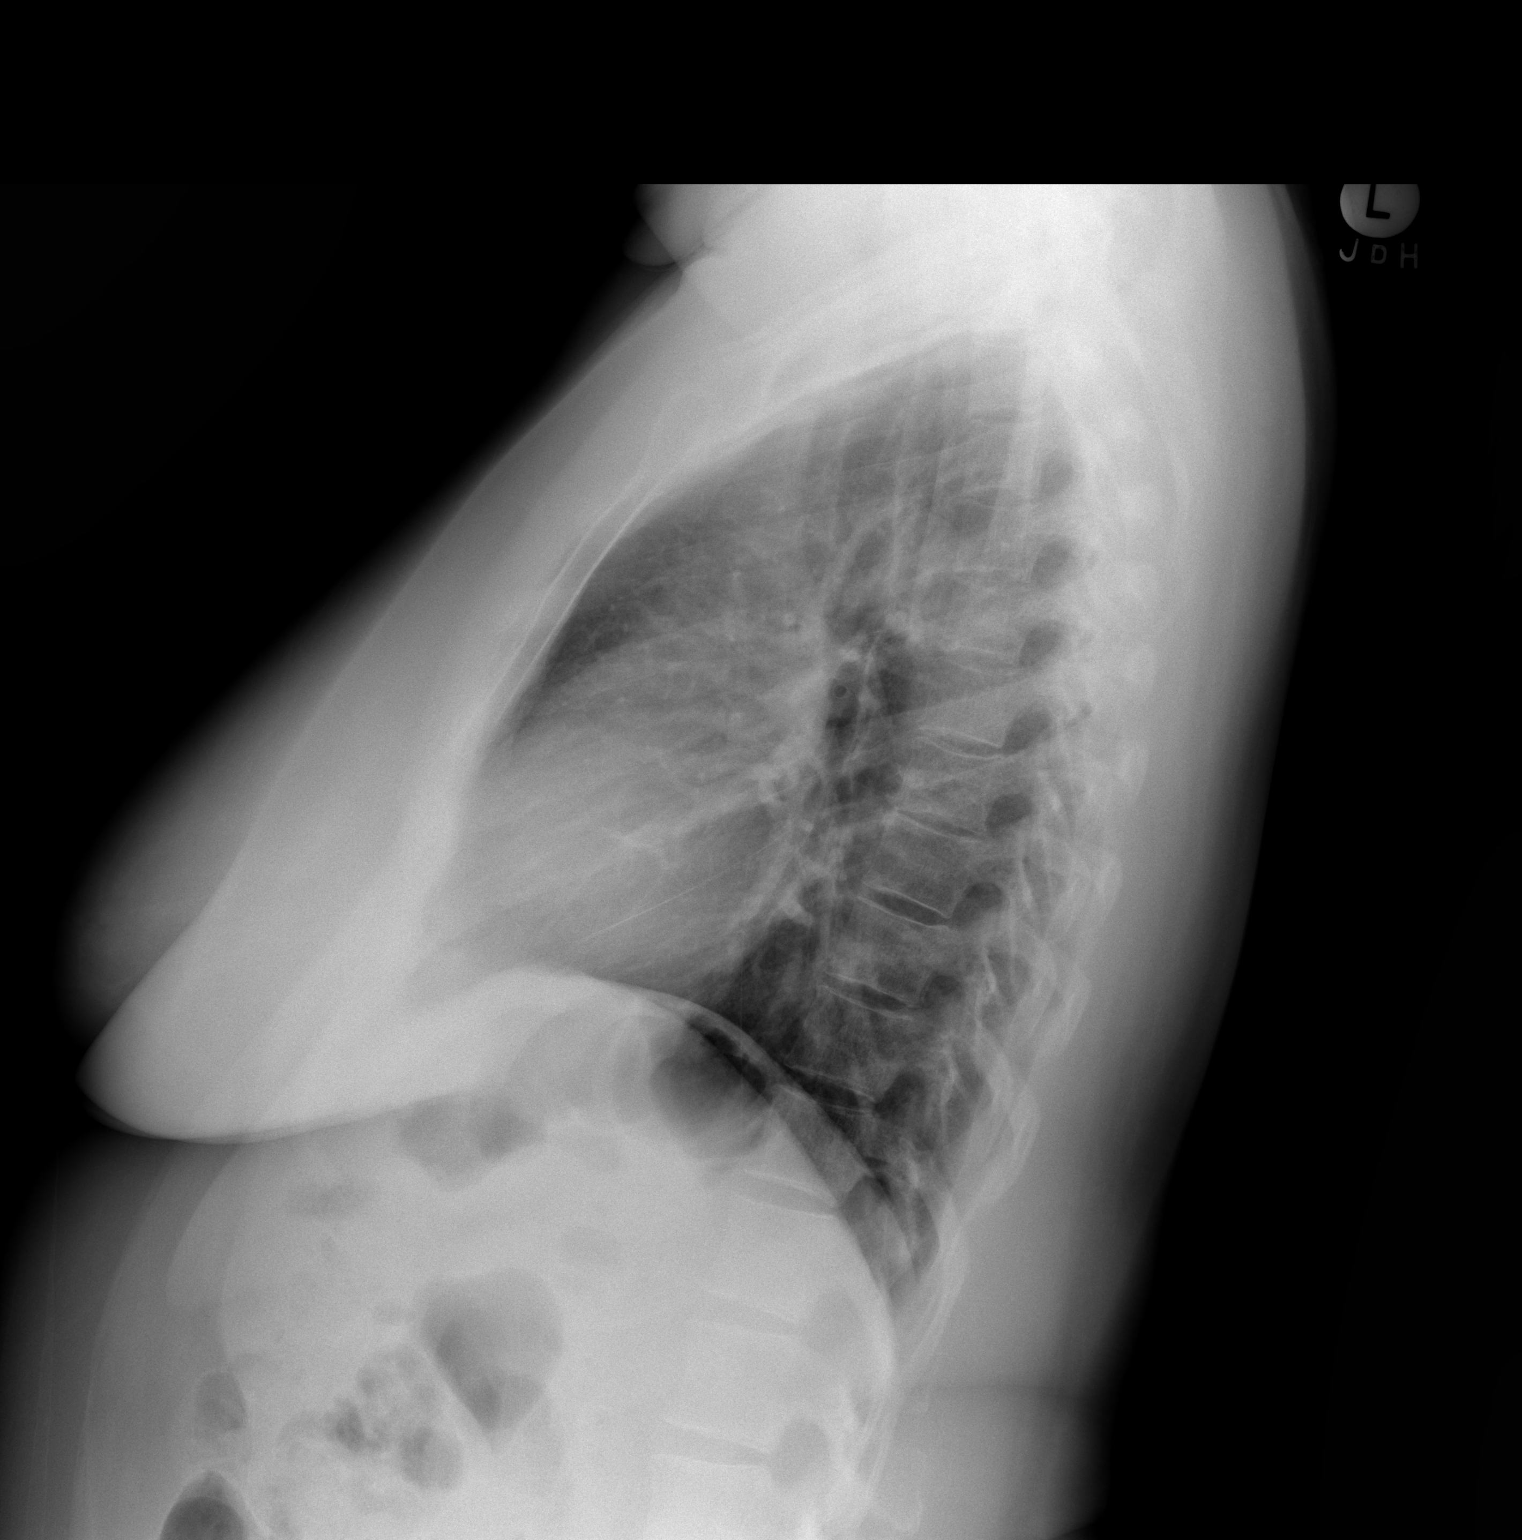

[2 of 2 positions shown; findings below may reference images not displayed]

FINDINGS: The heart size and mediastinal contours are within normal limits.

A questionable 4-5 mm pulmonary nodules identified at the left base.
Both lungs are otherwise clear. The visualized skeletal structures
are unremarkable.
IMPRESSION: 1. No acute intrathoracic process.
2. Questionable 4-5 mm nodule at the left lung base. Further
evaluation with nonemergent chest CT recommended.

## 2018-11-17 IMAGING — CR DG CERVICAL SPINE COMPLETE 4+V
5 series · 5 of 5 positions shown · non-contrast
Comparison: None.

CLINICAL DATA: Left-sided neck pain. Left arm numbness and
weakness.

EXAM:
CERVICAL SPINE - COMPLETE 4+ VIEW

[w cervical spine lat]
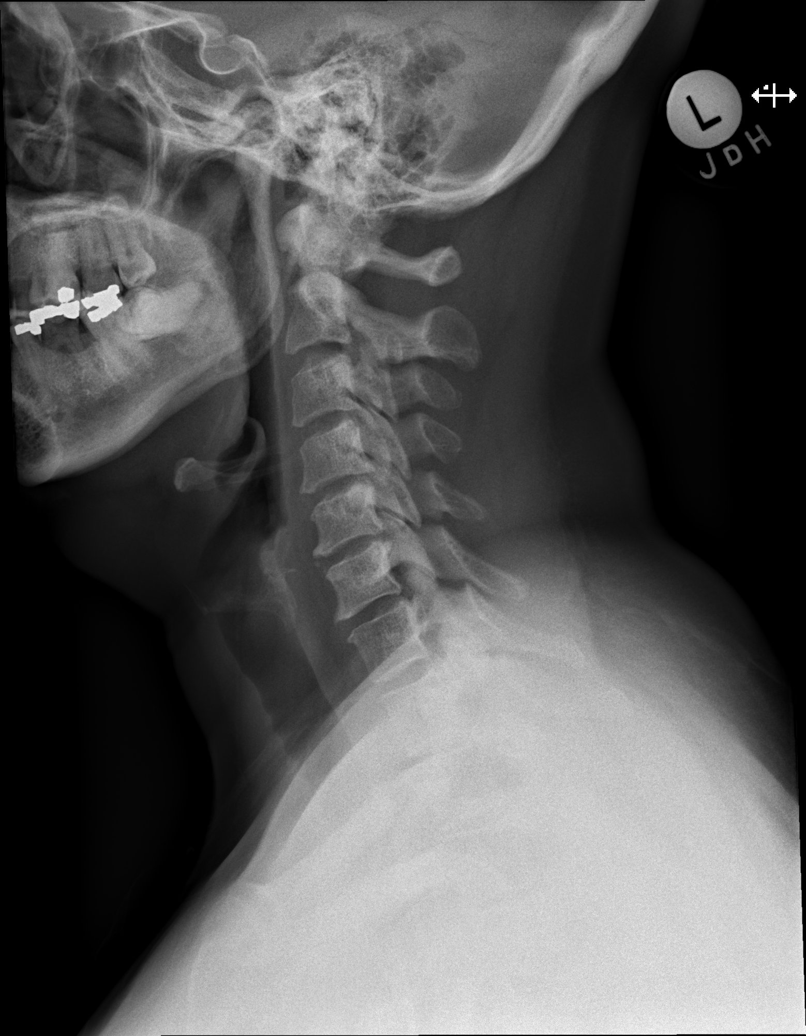

[w cervical spine ap_obl (1 of 2)]
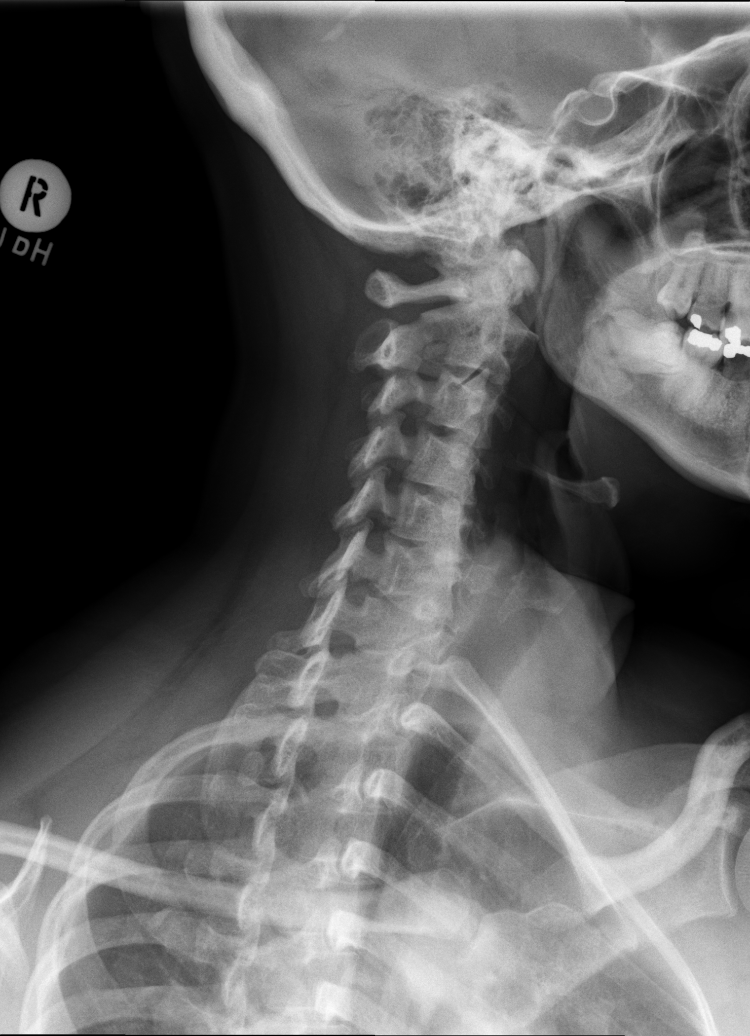

[w cervical spine ap_obl (2 of 2)]
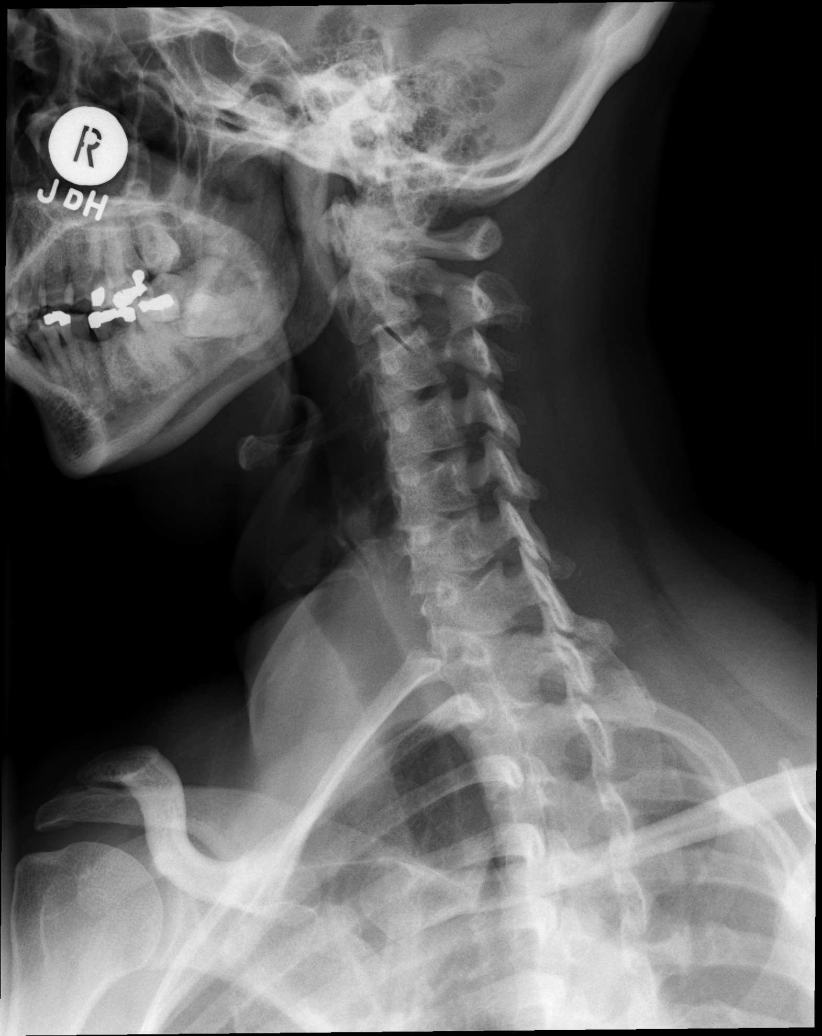

[w cervical spine ap]
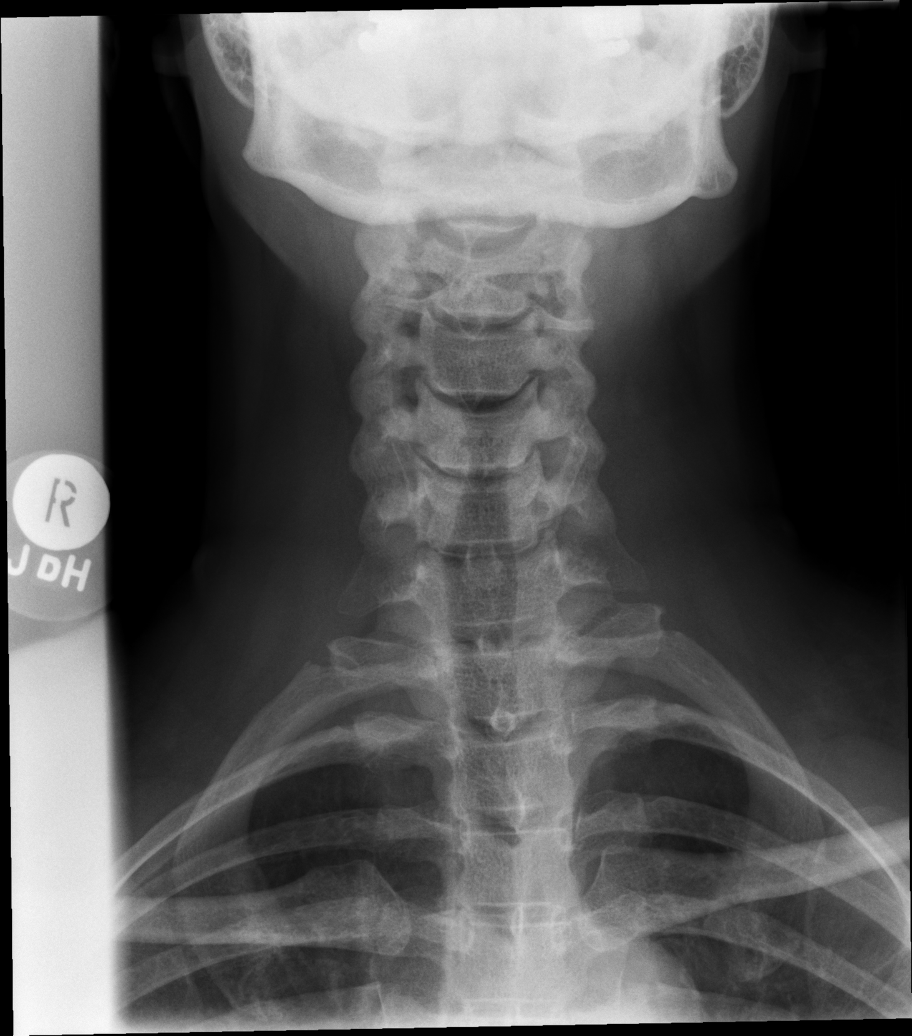

[t cervical spine odontoid]
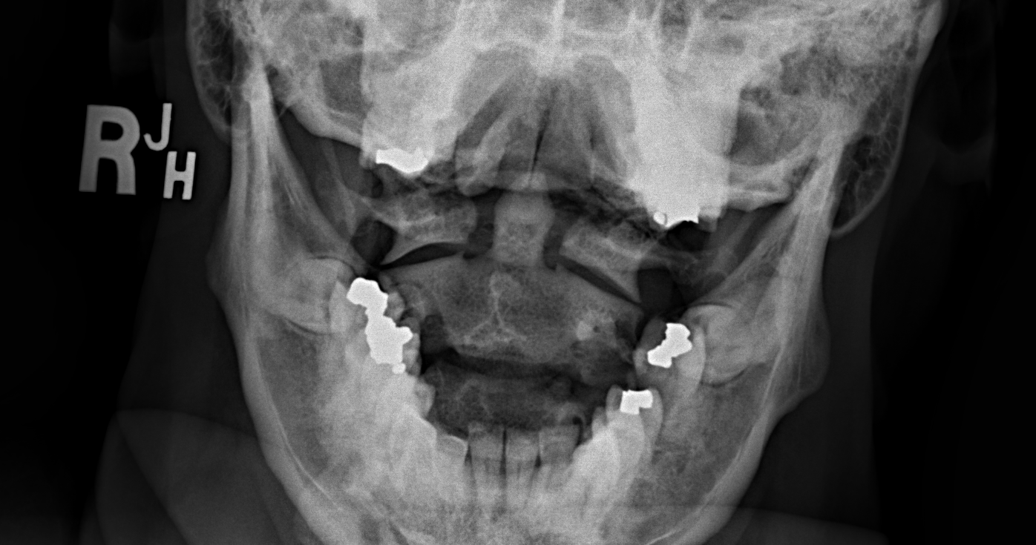

[5 of 5 positions shown; findings below may reference images not displayed]

FINDINGS: Vertebral alignment is normal. Prevertebral soft tissues are within
normal limits. No fracture is identified. Mild degenerative endplate
spurring is present at C4-5, C5-6, and C6-7. At most minimal disc
space narrowing is present at these levels. There is minimal osseous
neural foraminal narrowing bilaterally at C4-5. The visualized lung
apices are clear.
IMPRESSION: Mild disc degeneration in the mid and lower cervical spine.

## 2018-12-02 ENCOUNTER — Telehealth: Payer: Self-pay | Admitting: Internal Medicine

## 2018-12-02 NOTE — Telephone Encounter (Signed)
Left message for pt to call back  °

## 2018-12-02 NOTE — Telephone Encounter (Signed)
Pt stated that she has been having "GI issues" but her insurance is [employer] [facility]).  Pt would like a call to discuss her options.

## 2018-12-02 NOTE — Telephone Encounter (Signed)
Pt states she switched jobs and now has new insurance. She thinks she needs her labs checked, wanted to know where she can get this done. Pt states she does not have a PCP, explained the quickest thing would be an urgent care. Pt will have to establish care with [facility] provider.

## 2019-01-04 ENCOUNTER — Other Ambulatory Visit: Payer: Self-pay | Admitting: *Deleted

## 2019-01-04 DIAGNOSIS — Z20822 Contact with and (suspected) exposure to covid-19: Secondary | ICD-10-CM

## 2019-01-10 LAB — NOVEL CORONAVIRUS, NAA: SARS-CoV-2, NAA: NOT DETECTED

## 2019-03-19 ENCOUNTER — Encounter: Payer: Self-pay | Admitting: Gynecology

## 2019-03-21 ENCOUNTER — Other Ambulatory Visit: Payer: Self-pay

## 2019-03-21 ENCOUNTER — Emergency Department (HOSPITAL_COMMUNITY): Payer: BLUE CROSS/BLUE SHIELD

## 2019-03-21 ENCOUNTER — Emergency Department (HOSPITAL_COMMUNITY)
Admission: EM | Admit: 2019-03-21 | Discharge: 2019-03-21 | Disposition: A | Discharge status: Home / Self Care | Payer: BLUE CROSS/BLUE SHIELD | Attending: Emergency Medicine | Admitting: Emergency Medicine

## 2019-03-21 ENCOUNTER — Encounter (HOSPITAL_COMMUNITY): Payer: Self-pay | Admitting: Emergency Medicine

## 2019-03-21 DIAGNOSIS — M545 Low back pain: Secondary | ICD-10-CM | POA: Diagnosis present

## 2019-03-21 DIAGNOSIS — Z79899 Other long term (current) drug therapy: Secondary | ICD-10-CM | POA: Insufficient documentation

## 2019-03-21 DIAGNOSIS — M25561 Pain in right knee: Secondary | ICD-10-CM | POA: Diagnosis not present

## 2019-03-21 DIAGNOSIS — M549 Dorsalgia, unspecified: Secondary | ICD-10-CM

## 2019-03-21 MED ORDER — METHOCARBAMOL 500 MG PO TABS: 500.0000 mg | ORAL_TABLET | Freq: Every evening | ORAL | 0 refills | Status: DC | PRN

## 2019-03-21 NOTE — ED Notes (Signed)
Visitor at bedside.

## 2019-03-21 NOTE — ED Notes (Signed)
Pt ambulatory to bathroom

## 2019-03-21 NOTE — ED Notes (Signed)
Patient transported to X-ray 
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
## 2019-03-21 NOTE — Discharge Instructions (Addendum)

## 2019-03-21 NOTE — ED Notes (Signed)
Pt d/c home per MD order. Discharge summary reviewed with pt, pt verbalizes understanding. Pt family here for discharge ride home.

## 2019-03-21 NOTE — ED Triage Notes (Signed)
Pt c/o back pains and right shoulder pains.
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
## 2019-03-23 ENCOUNTER — Other Ambulatory Visit: Payer: Self-pay

## 2019-03-23 ENCOUNTER — Emergency Department (HOSPITAL_COMMUNITY)
Admission: EM | Admit: 2019-03-23 | Discharge: 2019-03-23 | Disposition: A | Discharge status: Home / Self Care | Payer: BLUE CROSS/BLUE SHIELD | Attending: Emergency Medicine | Admitting: Emergency Medicine

## 2019-03-23 ENCOUNTER — Encounter (HOSPITAL_COMMUNITY): Payer: Self-pay | Admitting: Emergency Medicine

## 2019-03-23 DIAGNOSIS — Z79899 Other long term (current) drug therapy: Secondary | ICD-10-CM | POA: Insufficient documentation

## 2019-03-23 DIAGNOSIS — S161XXA Strain of muscle, fascia and tendon at neck level, initial encounter: Secondary | ICD-10-CM | POA: Diagnosis not present

## 2019-03-23 DIAGNOSIS — Y929 Unspecified place or not applicable: Secondary | ICD-10-CM | POA: Insufficient documentation

## 2019-03-23 DIAGNOSIS — Y999 Unspecified external cause status: Secondary | ICD-10-CM | POA: Insufficient documentation

## 2019-03-23 DIAGNOSIS — Y939 Activity, unspecified: Secondary | ICD-10-CM | POA: Diagnosis not present

## 2019-03-23 DIAGNOSIS — S199XXA Unspecified injury of neck, initial encounter: Secondary | ICD-10-CM | POA: Diagnosis present

## 2019-03-23 MED ORDER — LIDOCAINE 5 % EX PTCH
1.0000 | MEDICATED_PATCH | CUTANEOUS | Status: DC
  Administered 2019-03-23: 18:00:00 1 via TRANSDERMAL
  Filled 2019-03-23: qty 1

## 2019-03-23 NOTE — Discharge Instructions (Signed)
Gentle stretching as demonstrated. Apply warm compresses to neck for 30 minutes three times daily.  Continue with Robaxin and Tylenol.  Follow up with your doctor for recheck this week.
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
## 2019-08-25 ENCOUNTER — Ambulatory Visit: Payer: Self-pay | Attending: Internal Medicine

## 2019-08-25 DIAGNOSIS — Z20822 Contact with and (suspected) exposure to covid-19: Secondary | ICD-10-CM

## 2019-08-26 LAB — NOVEL CORONAVIRUS, NAA: SARS-CoV-2, NAA: NOT DETECTED

## 2021-08-16 ENCOUNTER — Other Ambulatory Visit: Payer: Self-pay

## 2021-08-17 ENCOUNTER — Other Ambulatory Visit (HOSPITAL_COMMUNITY)
Admission: RE | Admit: 2021-08-17 | Discharge: 2021-08-17 | Disposition: A | Discharge status: Home / Self Care | Payer: 59 | Source: Ambulatory Visit | Attending: Nurse Practitioner | Admitting: Nurse Practitioner

## 2021-08-17 ENCOUNTER — Encounter: Payer: Self-pay | Admitting: Nurse Practitioner

## 2021-08-17 ENCOUNTER — Ambulatory Visit (INDEPENDENT_AMBULATORY_CARE_PROVIDER_SITE_OTHER): Payer: 59 | Admitting: Nurse Practitioner

## 2021-08-17 VITALS — BP 132/98 | HR 90 | Temp 97.0°F | Ht 63.0 in | Wt 197.2 lb

## 2021-08-17 DIAGNOSIS — D509 Iron deficiency anemia, unspecified: Secondary | ICD-10-CM | POA: Diagnosis not present

## 2021-08-17 DIAGNOSIS — M7542 Impingement syndrome of left shoulder: Secondary | ICD-10-CM

## 2021-08-17 DIAGNOSIS — Z124 Encounter for screening for malignant neoplasm of cervix: Secondary | ICD-10-CM

## 2021-08-17 DIAGNOSIS — E538 Deficiency of other specified B group vitamins: Secondary | ICD-10-CM

## 2021-08-17 DIAGNOSIS — M4302 Spondylolysis, cervical region: Secondary | ICD-10-CM

## 2021-08-17 DIAGNOSIS — E669 Obesity, unspecified: Secondary | ICD-10-CM

## 2021-08-17 DIAGNOSIS — E559 Vitamin D deficiency, unspecified: Secondary | ICD-10-CM | POA: Diagnosis not present

## 2021-08-17 DIAGNOSIS — K219 Gastro-esophageal reflux disease without esophagitis: Secondary | ICD-10-CM

## 2021-08-17 DIAGNOSIS — R232 Flushing: Secondary | ICD-10-CM

## 2021-08-17 MED ORDER — PANTOPRAZOLE SODIUM 40 MG PO TBEC: 40.0000 mg | DELAYED_RELEASE_TABLET | Freq: Every day | ORAL | 1 refills | Status: DC

## 2021-08-17 MED ORDER — GABAPENTIN 300 MG PO CAPS: 300.0000 mg | ORAL_CAPSULE | Freq: Every day | ORAL | 2 refills | Status: DC

## 2021-08-17 MED ORDER — CONTRAVE 8-90 MG PO TB12: ORAL_TABLET | ORAL | 0 refills | Status: DC

## 2021-08-17 NOTE — Assessment & Plan Note (Signed)
She has a history of a GI bleed with anemia requiring blood transfusions.  She was taking an iron to supplement daily however for the last few months she has been taking it twice a week.  Last month her hemoglobin was elevated.  We will check CBC and iron panel today.  She is starting to go through perimenopause.  Need to be cautious with the amount of iron she is taking.

## 2021-08-17 NOTE — Assessment & Plan Note (Signed)
BMI is 34.9 today.  Discussed diet and exercise.  She can track her calories using a free app like my fitness pal or on paper.  Encouraged her to exercise as able.  We discussed medication options and patient would like to try Contrave.  Medication sent to the pharmacy.  Also gave her a list of other medications that she can call her insurance to see if they cover.  Follow-up in 6 to 8 weeks

## 2021-08-17 NOTE — Assessment & Plan Note (Signed)
She is currently taking vitamin B12 supplement over-the-counter after her GI bleed.  We will check B12 level today and adjust regimen as needed.

## 2021-08-17 NOTE — Assessment & Plan Note (Signed)
Chronic, ongoing. She can't take NSAIDs with history of GI bleed.  We will start her on gabapentin 300 mg at bedtime.  With the pain radiating down her left arm and some numbness there is most likely some impingement occurring.  She can also use Voltaren gel over-the-counter and Tylenol as needed.  Follow-up in 4 to 6 weeks.
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
## 2021-08-17 NOTE — Assessment & Plan Note (Signed)
She is currently taking vitamin D over-the-counter.  We will check vitamin D level today and adjust regimen as needed.

## 2021-08-17 NOTE — Assessment & Plan Note (Signed)
Chronic, ongoing.  She was taking Tums however her calcium was elevated and was told to stop.  We will start Protonix 40 mg daily.  Discussed diet and eating small meals and avoiding foods that trigger her reflux.

## 2021-08-17 NOTE — Assessment & Plan Note (Signed)
For the past couple years she has been going through perimenopause.  Her last menstrual period was 5 months ago.  She endorses hot flashes daily and at night.  We are starting gabapentin for her neck pain which can also help with hot flashes.  She can also take black cohosh over-the-counter.

## 2021-08-17 NOTE — Patient Instructions (Addendum)
It was great to see you!  We are checking your iron counts, vitamin D, and vitamin b12 today.  Start gabapentin 1 capsule at bedtime. This will help your neck pain and hot flashes.   I am also starting contrave for weight loss. Start 1 pill daily for 7 days, then 1 pill twice a day for 7 days, then 2 pills in the morning and 1 pill in the evening. You can call your insurance to see if they cover:  Semaglutide (wegovy) Liraglutide (saxenda)  I have sent in protonix 40mg  daily for acid reflux.  You can use voltaren gel over the counter for pain and black kohosh over the counter for hot flashes.   Let's follow-up in 6-8 weeks, sooner if you have concerns.  If a referral was placed today, you will be contacted for an appointment. Please note that routine referrals can sometimes take up to 3-4 weeks to process. Please call our office if you haven't heard anything after this time frame.  Take care,  Vance Peper, NP

## 2021-08-18 LAB — CBC WITH DIFFERENTIAL/PLATELET
Basophils Absolute: 0 10*3/uL (ref 0.0–0.1)
Basophils Relative: 1.1 % (ref 0.0–3.0)
Eosinophils Absolute: 0.2 10*3/uL (ref 0.0–0.7)
Eosinophils Relative: 4.7 % (ref 0.0–5.0)
HCT: 45.9 % (ref 36.0–46.0)
Hemoglobin: 15.2 g/dL — ABNORMAL HIGH (ref 12.0–15.0)
Lymphocytes Relative: 32.9 % (ref 12.0–46.0)
Lymphs Abs: 1.5 10*3/uL (ref 0.7–4.0)
MCHC: 33 g/dL (ref 30.0–36.0)
MCV: 88.1 fl (ref 78.0–100.0)
Monocytes Absolute: 0.4 10*3/uL (ref 0.1–1.0)
Monocytes Relative: 7.6 % (ref 3.0–12.0)
Neutro Abs: 2.5 10*3/uL (ref 1.4–7.7)
Neutrophils Relative %: 53.7 % (ref 43.0–77.0)
Platelets: 362 10*3/uL (ref 150.0–400.0)
RBC: 5.21 Mil/uL — ABNORMAL HIGH (ref 3.87–5.11)
RDW: 14.7 % (ref 11.5–15.5)
WBC: 4.6 10*3/uL (ref 4.0–10.5)

## 2021-08-18 LAB — VITAMIN B12: Vitamin B-12: 970 pg/mL — ABNORMAL HIGH (ref 211–911)

## 2021-08-18 LAB — IRON,TIBC AND FERRITIN PANEL
%SAT: 16 % (calc) (ref 16–45)
Ferritin: 363 ng/mL — ABNORMAL HIGH (ref 16–232)
Iron: 66 ug/dL (ref 45–160)
TIBC: 421 mcg/dL (calc) (ref 250–450)

## 2021-08-18 LAB — VITAMIN D 25 HYDROXY (VIT D DEFICIENCY, FRACTURES): VITD: 43.29 ng/mL (ref 30.00–100.00)

## 2021-08-22 NOTE — Progress Notes (Signed)
Patient reviewed provider comments and instructions via mychart

## 2021-08-23 LAB — CYTOLOGY - PAP
Comment: NEGATIVE
Diagnosis: NEGATIVE
High risk HPV: NEGATIVE

## 2021-09-06 ENCOUNTER — Telehealth: Payer: Self-pay

## 2021-09-06 NOTE — Telephone Encounter (Signed)
PA STARTED ? ?DATE: 09/06/2021 ? ?TIME: ? ?TAT OF DETERMINATION: 72 hours ? ?PAT NOTIFIED? Yes  ? ?Additional information faxed along with PA form ?

## 2021-09-07 ENCOUNTER — Telehealth: Payer: Self-pay

## 2021-09-07 NOTE — Telephone Encounter (Signed)
Called and informed patient of previous colonoscopy results and confirmed when nxt one is due. Pt voiced understanding. ?

## 2021-09-13 ENCOUNTER — Telehealth: Payer: Self-pay

## 2021-09-13 NOTE — Telephone Encounter (Signed)
PA denied for Contrave 8-'90mg'$ . Patient notified via phone call at 1605 on 09/13/2021.   ? ? ?

## 2021-09-13 NOTE — Telephone Encounter (Signed)
A user error has taken place: encounter opened in error, closed for administrative reasons.

## 2021-09-16 NOTE — Telephone Encounter (Signed)
Called and lvm to schedule f/u appt. Sw,cma ?

## 2021-09-16 NOTE — Telephone Encounter (Signed)
Pt scheduled  

## 2021-09-21 ENCOUNTER — Encounter: Payer: Self-pay | Admitting: Nurse Practitioner

## 2021-09-21 ENCOUNTER — Telehealth (INDEPENDENT_AMBULATORY_CARE_PROVIDER_SITE_OTHER): Payer: 59 | Admitting: Nurse Practitioner

## 2021-09-21 VITALS — BP 133/89 | Ht 63.0 in | Wt 196.0 lb

## 2021-09-21 DIAGNOSIS — E669 Obesity, unspecified: Secondary | ICD-10-CM

## 2021-09-21 DIAGNOSIS — M7542 Impingement syndrome of left shoulder: Secondary | ICD-10-CM

## 2021-09-21 MED ORDER — SEMAGLUTIDE(0.25 OR 0.5MG/DOS) 2 MG/1.5ML ~~LOC~~ SOPN: 0.5000 mg | PEN_INJECTOR | SUBCUTANEOUS | 1 refills | Status: DC

## 2021-09-21 NOTE — Patient Instructions (Signed)
It was great to see you! ? ?Start semaglutide injection once a week.  You can schedule a nurse visit once this gets approved so that we can teach you how to give it to yourself. ? ?Continue gabapentin 1 capsule at bedtime. ? ?Let's follow-up in 6 to 8 weeks, sooner if you have concerns. ? ?If a referral was placed today, you will be contacted for an appointment. Please note that routine referrals can sometimes take up to 3-4 weeks to process. Please call our office if you haven't heard anything after this time frame. ? ?Take care, ? ?Vance Peper, NP ? ?

## 2021-09-21 NOTE — Assessment & Plan Note (Signed)
She has lost 4 pounds in the past few weeks.  Congratulated her on this.  This was done with dietary changes and exercise.  Her insurance unfortunately denied Contrave.  She is still interested in medication to help with weight loss, will start semaglutide 0.25 mg weekly injection.  Discussed possible side effects.  She can schedule a nurse visit for the first injection to teach her how to give it to her self.  Continue dietary changes and exercise.  Follow-up in 6 to 8 weeks. ?

## 2021-09-21 NOTE — Progress Notes (Signed)
?[facility] ?[facility] ?[place] ?[place] [address] ?Dept: [phone] ?Dept Fax: [phone] ? ?Virtual Video Visit ? ?I connected with Anita Holland on 09/21/21 at  3:20 PM EDT by a video enabled telemedicine application and verified that I am speaking with the correct person using two identifiers. ? ?Location patient: Home ?Location provider: Clinic ?Persons participating in the virtual visit: Patient, Provider, [place] ? ?I discussed the limitations of evaluation and management by telemedicine and the availability of in person appointments. The patient expressed understanding and agreed to proceed. ? ?Chief Complaint  ?Patient presents with  ? Follow-up  ?  Pt would like to discuss medication options since her insurance denied the Contrave prescription.   ? ? ?SUBJECTIVE: ? ?HPI: Anita Holland is a 56 y.o. female who presents with follow-up on weight loss and left shoulder impingement ? ?She has cut back on her food and portion sizes. She is down about 4 pounds in the last few weeks.  She has been walking after work. She feels like her increased weight has cuased some pain in her joints, specifically her knees.  She was started on Contrave last visit however her insurance denied this medication.  She is interested if there is other options for weight loss medications. ? ?She has been taking gabapentin at bedtime which has helped with her neck pain and left shoulder impingement.  She states that she is not having any more pain currently.  She did forget to take it for a few nights and did notice her symptoms return, however with restarting it again her symptoms have improved.  She has not noticed any side effects from the medication. ? ?Patient Active Problem List  ? Diagnosis Date Noted  ? Vitamin D deficiency 08/17/2021  ? Vitamin B 12 deficiency 08/17/2021  ? Hot flashes 08/17/2021  ? Obesity (BMI 30-39.9) 08/17/2021  ? H. pylori infection 01/04/2018  ?  History of gastric ulcer 01/04/2018  ? Iron deficiency anemia 01/04/2018  ? Abnormal Pap smear of cervix 05/08/2017  ? Impingement syndrome of left shoulder 04/02/2017  ? Trigger thumb, left thumb 04/02/2017  ? Spondylolysis, cervical region 03/19/2017  ? GERD (gastroesophageal reflux disease) 03/27/2016  ? ? ?Past Surgical History:  ?Procedure Laterality Date  ? DILATION AND CURETTAGE OF UTERUS    ? ESOPHAGOGASTRODUODENOSCOPY N/A 10/30/2017  ? Procedure: ESOPHAGOGASTRODUODENOSCOPY (EGD);  Surgeon: Mauri Pole, MD;  Location: [facility];  Service: Endoscopy;  Laterality: N/A;  ? ? ?Family History  ?Problem Relation Age of Onset  ? Stroke Sister 36  ? Hypertension Sister   ? Depression Sister   ? Heart attack Sister 36  ? Colon cancer Neg Hx   ? Colon polyps Neg Hx   ? Esophageal cancer Neg Hx   ? Rectal cancer Neg Hx   ? Stomach cancer Neg Hx   ? ? ?Social History  ? ?Tobacco Use  ? Smoking status: Never  ? Smokeless tobacco: Never  ?Vaping Use  ? Vaping Use: Never used  ?Substance Use Topics  ? Alcohol use: No  ? Drug use: No  ? ? ? ?Current Outpatient Medications:  ?  amLODipine (NORVASC) 5 MG tablet, Take 1 tablet by mouth daily., Disp: , Rfl:  ?  Cyanocobalamin (B-12 PO), Take by mouth., Disp: , Rfl:  ?  ferrous sulfate 325 (65 FE) MG tablet, Take 325 mg by mouth daily with breakfast., Disp: , Rfl:  ?  gabapentin (NEURONTIN) 300 MG capsule, Take 1  capsule (300 mg total) by mouth at bedtime., Disp: 30 capsule, Rfl: 2 ?  pantoprazole (PROTONIX) 40 MG tablet, Take 1 tablet (40 mg total) by mouth daily., Disp: 90 tablet, Rfl: 1 ?  Semaglutide,0.25 or 0.'5MG'$ /DOS, 2 MG/1.5ML SOPN, Inject 0.5 mg into the skin once a week., Disp: 1.5 mL, Rfl: 1 ?  bismuth subsalicylate (PEPTO-BISMOL) 262 MG chewable tablet, Chew 2 tablets (524 mg total) by mouth 4 (four) times daily -  before meals and at bedtime. (Patient not taking: Reported on 09/21/2021), Disp: 40 tablet, Rfl: 0 ? ?No Known Allergies ? ?ROS: See pertinent  positives and negatives per HPI. ? ?OBSERVATIONS/OBJECTIVE: ? ?VITALS per patient if applicable: ?Today's Vitals  ? 09/21/21 1521  ?BP: 133/89  ?Weight: 196 lb (88.9 kg)  ?Height: '5\' 3"'$  (1.6 m)  ? ?Body mass index is 34.72 kg/m?. ?  ? ?GENERAL: Alert and oriented. Appears well and in no acute distress. ? ?HEENT: Atraumatic. Conjunctiva clear. No obvious abnormalities on inspection of external nose and ears. ? ?NECK: Normal movements of the head and neck. ? ?LUNGS: On inspection, no signs of respiratory distress. Breathing rate appears normal. No obvious gross SOB, gasping or wheezing, and no conversational dyspnea. ? ?CV: No obvious cyanosis. ? ?MS: Moves all visible extremities without noticeable abnormality. ? ?PSYCH/NEURO: Pleasant and cooperative. No obvious depression or anxiety. Speech and thought processing grossly intact. ? ?ASSESSMENT AND PLAN: ? ?Problem List Items Addressed This Visit   ? ?  ? Musculoskeletal and Integument  ? 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  Chronic, stable.  She was started on gabapentin 300 mg at bedtime which has significantly helped her pain in her neck and left shoulder.  She can continue Tylenol as needed.  We will continue gabapentin 300 mg nightly.  Follow-up with any concerns. ?  ?  ?  ? Other  ? Obesity (BMI 30-39.9)  ?  She has lost 4 pounds in the past few weeks.  Congratulated her on this.  This was done with dietary changes and exercise.  Her insurance unfortunately denied Contrave.  She is still interested in medication to help with weight loss, will start semaglutide 0.25 mg weekly injection.  Discussed possible side effects.  She can schedule a nurse visit for the first injection to teach her how to give it to her self.  Continue dietary changes and exercise.  Follow-up in 6 to 8 weeks. ?  ?  ? Relevant Medications  ? Semaglutide,0.25 or 0.'5MG'$ /DOS, 2 MG/1.5ML SOPN  ? ?  ?I discussed the assessment and treatment plan with the patient. The patient was  provided an opportunity to ask questions and all were answered. The patient agreed with the plan and demonstrated an understanding of the instructions. ?  ?The patient was advised to call back or seek an in-person evaluation if the symptoms worsen or if the condition fails to improve as anticipated. ? ?Time spent on call:  15 minutes on the phone discussing health concerns. 5 minutes total spent in review of patient's record and preparation of their chart. ? ? ?Charyl Dancer, NP  ?

## 2021-09-21 NOTE — Assessment & Plan Note (Signed)
Chronic, stable.  She was started on gabapentin 300 mg at bedtime which has significantly helped her pain in her neck and left shoulder.  She can continue Tylenol as needed.  We will continue gabapentin 300 mg nightly.  Follow-up with any concerns. ?

## 2021-09-26 ENCOUNTER — Telehealth: Payer: Self-pay | Admitting: Nurse Practitioner

## 2021-09-26 DIAGNOSIS — E669 Obesity, unspecified: Secondary | ICD-10-CM

## 2021-09-26 NOTE — Telephone Encounter (Signed)
Pt is checking on status of her Semaglutide,0.25 or 0.'5MG'$ /DOS, 2 MG/1.5ML SOPN [[id number]]  script apparently there is an insurance issue.  ? ?She is also wanting this script sent to [id number] [place], [place] - [address] ?[address] [id number] ?Phone:  [phone] Fax:  [phone] ?DEA #:  [id number]?

## 2021-09-28 ENCOUNTER — Encounter: Payer: Self-pay | Admitting: Nurse Practitioner

## 2021-09-28 ENCOUNTER — Ambulatory Visit (INDEPENDENT_AMBULATORY_CARE_PROVIDER_SITE_OTHER): Payer: 59 | Admitting: Nurse Practitioner

## 2021-09-28 VITALS — BP 122/84 | HR 97 | Temp 97.4°F | Wt 198.8 lb

## 2021-09-28 DIAGNOSIS — N939 Abnormal uterine and vaginal bleeding, unspecified: Secondary | ICD-10-CM | POA: Diagnosis not present

## 2021-09-28 NOTE — Telephone Encounter (Signed)
[facility] where prescription was sent. Refaxing PA request. Sw, cma ?

## 2021-09-28 NOTE — Assessment & Plan Note (Signed)
She has been having heavy vaginal bleeding for the last 9 days.  Her last menstrual cycle was 7 months ago.  She does have a history of uterine fibroids and had them removed.  Before it was removed she did end up in the hospital and had a blood transfusion and was admitted to the hospital.  She is concerned that this may be happening again and wants to get ahead of it before she ends up in the hospital.  Her vaginal bleeding has decreased and heaviness yesterday.  We will check her CBC and iron today.  Referral placed to GYN.  Follow-up if symptoms worsen or with any concerns. ?

## 2021-09-28 NOTE — Progress Notes (Signed)
? ?Acute Office Visit ? ?Subjective:  ? ? Patient ID: Anita Holland, female    DOB: 10-Mar-1966, 56 y.o.   MRN: [id number] ? ?Chief Complaint  ?Patient presents with  ? Vaginal Bleeding  ?  Pt c/o heavy vaginal bleeding x1 wk   ? ? ?Vaginal Bleeding ? ?Patient is in today for heavy vaginal bleeding for 9 days. She has a history of uterine fibroid and is having similar symptoms. Her last menstrual period was 7 months ago. She was having clots and heavy bleeding. Bleeding has since slowed down and not had any clots since yesterday. Last week she was having to change her pad every hour or 2 hours. Yesterday, she was able to change it every 3-4 hours. Denies cramps, abdominal pain, back pain, nausea, pallor, shortness of breath, and chest pain. She endorses feeling week and tired.  ? ?Past Medical History:  ?Diagnosis Date  ? Allergy   ? Arthritis   ? GERD (gastroesophageal reflux disease)   ? Hypertension   ? 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 Few cells suggesting high-grade lesion.  Colposcopy showed inflammatory changes.  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  Recommend follow-up Pap smear 1 year  ? Peptic ulcer   ? Upper GI bleed 10/28/2017  ? ? ?Past Surgical History:  ?Procedure Laterality Date  ? DILATION AND CURETTAGE OF UTERUS    ? ESOPHAGOGASTRODUODENOSCOPY N/A 10/30/2017  ? Procedure: ESOPHAGOGASTRODUODENOSCOPY (EGD);  Surgeon: Mauri Pole, MD;  Location: [facility];  Service: Endoscopy;  Laterality: N/A;  ? ? ?Family History  ?Problem Relation Age of Onset  ? Stroke Sister 31  ? Hypertension Sister   ? Depression Sister   ? Heart attack Sister 25  ? Colon cancer Neg Hx   ? Colon polyps Neg Hx   ? Esophageal cancer Neg Hx   ? Rectal cancer Neg Hx   ? Stomach cancer Neg Hx   ? ? ?Social History  ? ?Socioeconomic History  ? Marital status: Single  ?  Spouse name: Not on file  ? Number of children: Not on file  ? Years of education: Not on file  ? Highest education level: Not on file   ?Occupational History  ? Not on file  ?Tobacco Use  ? Smoking status: Never  ? Smokeless tobacco: Never  ?Vaping Use  ? Vaping Use: Never used  ?Substance and Sexual Activity  ? Alcohol use: No  ? Drug use: No  ? Sexual activity: Yes  ?  Birth control/protection: Condom  ?  Comment: 1st intercourse 15yo-5 partners  ?Other Topics Concern  ? Not on file  ?Social History Narrative  ? Not on file  ? ?Social Determinants of Health  ? ?Financial Resource Strain: Not on file  ?Food Insecurity: Not on file  ?Transportation Needs: Not on file  ?Physical Activity: Not on file  ?Stress: Not on file  ?Social Connections: Not on file  ?Intimate Partner Violence: Not on file  ? ? ?Outpatient Medications Prior to Visit  ?Medication Sig Dispense Refill  ? amLODipine (NORVASC) 5 MG tablet Take 1 tablet by mouth daily.    ? bismuth subsalicylate (PEPTO-BISMOL) 262 MG chewable tablet Chew 2 tablets (524 mg total) by mouth 4 (four) times daily -  before meals and at bedtime. (Patient not taking: Reported on 09/21/2021) 40 tablet 0  ? Cyanocobalamin (B-12 PO) Take by mouth.    ? gabapentin (NEURONTIN) 300 MG capsule Take 1 capsule (300 mg total) by mouth  at bedtime. 30 capsule 2  ? pantoprazole (PROTONIX) 40 MG tablet Take 1 tablet (40 mg total) by mouth daily. 90 tablet 1  ? Semaglutide,0.25 or 0.'5MG'$ /DOS, 2 MG/1.5ML SOPN Inject 0.5 mg into the skin once a week. 1.5 mL 1  ? ferrous sulfate 325 (65 FE) MG tablet Take 325 mg by mouth daily with breakfast.    ? ?No facility-administered medications prior to visit.  ? ? ?No Known Allergies ? ?Review of Systems  ?Genitourinary:  Positive for vaginal bleeding.  ?See pertinent positives and negatives per HPI. ?   ?Objective:  ?  ?Physical Exam ?Vitals and nursing note reviewed.  ?Constitutional:   ?   General: She is not in acute distress. ?   Appearance: Normal appearance.  ?HENT:  ?   Head: Normocephalic.  ?Eyes:  ?   Conjunctiva/sclera: Conjunctivae normal.  ?Cardiovascular:  ?   Rate and  Rhythm: Normal rate and regular rhythm.  ?   Pulses: Normal pulses.  ?   Heart sounds: Normal heart sounds.  ?Pulmonary:  ?   Effort: Pulmonary effort is normal.  ?   Breath sounds: Normal breath sounds.  ?Abdominal:  ?   Palpations: Abdomen is soft.  ?   Tenderness: There is no abdominal tenderness.  ?Musculoskeletal:  ?   Cervical back: Normal range of motion.  ?Skin: ?   General: Skin is warm.  ?   Coloration: Skin is not pale.  ?Neurological:  ?   General: No focal deficit present.  ?   Mental Status: She is alert and oriented to person, place, and time.  ?Psychiatric:     ?   Mood and Affect: Mood normal.     ?   Behavior: Behavior normal.     ?   Thought Content: Thought content normal.     ?   Judgment: Judgment normal.  ? ? ?BP 122/84 (BP Location: Left Arm, Patient Position: Sitting, Cuff Size: Normal)   Pulse 97   Temp (!) 97.4 ?F (36.3 ?C) (Temporal)   Wt 198 lb 12.8 oz (90.2 kg)   SpO2 97%   BMI 35.22 kg/m?  ?Wt Readings from Last 3 Encounters:  ?09/28/21 198 lb 12.8 oz (90.2 kg)  ?09/21/21 196 lb (88.9 kg)  ?08/17/21 197 lb 3.2 oz (89.4 kg)  ? ? ?Health Maintenance Due  ?Topic Date Due  ? Hepatitis C Screening  Never done  ? Zoster Vaccines- Shingrix (1 of 2) Never done  ? ? ?There are no preventive care reminders to display for this patient. ? ? ?Lab Results  ?Component Value Date  ? TSH 2.514 10/29/2017  ? ?Lab Results  ?Component Value Date  ? WBC 4.6 08/17/2021  ? HGB 15.2 (H) 08/17/2021  ? HCT 45.9 08/17/2021  ? MCV 88.1 08/17/2021  ? PLT 362.0 08/17/2021  ? ?Lab Results  ?Component Value Date  ? NA 141 11/13/2017  ? K 4.1 11/13/2017  ? CO2 21 11/13/2017  ? GLUCOSE 81 11/13/2017  ? BUN 9 11/13/2017  ? CREATININE 0.66 11/13/2017  ? BILITOT 0.4 11/13/2017  ? ALKPHOS 42 11/13/2017  ? AST 17 11/13/2017  ? ALT 14 11/13/2017  ? PROT 6.6 11/13/2017  ? ALBUMIN 4.5 11/13/2017  ? CALCIUM 9.7 11/13/2017  ? ANIONGAP 6 10/30/2017  ? ?Lab Results  ?Component Value Date  ? CHOL 214 (H) 05/02/2017  ? ?Lab  Results  ?Component Value Date  ? HDL 59 05/02/2017  ? ?Lab Results  ?Component Value Date  ?  LDLCALC 126 (H) 05/02/2017  ? ?Lab Results  ?Component Value Date  ? TRIG 172 (H) 05/02/2017  ? ?Lab Results  ?Component Value Date  ? CHOLHDL 3.6 05/02/2017  ? ?No results found for: HGBA1C ? ?   ?Assessment & Plan:  ? ?Problem List Items Addressed This Visit   ? ?  ? Genitourinary  ? Abnormal uterine bleeding - Primary  ?  She has been having heavy vaginal bleeding for the last 9 days.  Her last menstrual cycle was 7 months ago.  She does have a history of uterine fibroids and had them removed.  Before it was removed she did end up in the hospital and had a blood transfusion and was admitted to the hospital.  She is concerned that this may be happening again and wants to get ahead of it before she ends up in the hospital.  Her vaginal bleeding has decreased and heaviness yesterday.  We will check her CBC and iron today.  Referral placed to GYN.  Follow-up if symptoms worsen or with any concerns. ?  ?  ? Relevant Orders  ? CBC with Differential/Platelet  ? Comprehensive metabolic panel  ? Ambulatory referral to Gynecology  ? ? ?No orders of the defined types were placed in this encounter. ? ? ?Charyl Dancer, NP ? ?

## 2021-09-28 NOTE — Patient Instructions (Signed)
It was great to see you! ? ?I am placing referral to GYN.  We are checking your blood counts today and will let you know the results. ? ?If your insurance does not cover semaglutide injection, call and see if they cover: ? ?QPYPPJ - Injection ?Saxenda - Injection ?Liraglutide - Injection ?Contrave - pill ?Phentermine - pill ? ?Let's follow-up if your symptoms worsen or do not improve. ? ?Take care, ? ?Vance Peper, NP ? ?

## 2021-09-29 LAB — CBC WITH DIFFERENTIAL/PLATELET
Basophils Absolute: 0 10*3/uL (ref 0.0–0.1)
Basophils Relative: 0.8 % (ref 0.0–3.0)
Eosinophils Absolute: 0.3 10*3/uL (ref 0.0–0.7)
Eosinophils Relative: 5.3 % — ABNORMAL HIGH (ref 0.0–5.0)
HCT: 43.6 % (ref 36.0–46.0)
Hemoglobin: 14.4 g/dL (ref 12.0–15.0)
Lymphocytes Relative: 31 % (ref 12.0–46.0)
Lymphs Abs: 1.5 10*3/uL (ref 0.7–4.0)
MCHC: 32.9 g/dL (ref 30.0–36.0)
MCV: 88.8 fl (ref 78.0–100.0)
Monocytes Absolute: 0.4 10*3/uL (ref 0.1–1.0)
Monocytes Relative: 7.8 % (ref 3.0–12.0)
Neutro Abs: 2.7 10*3/uL (ref 1.4–7.7)
Neutrophils Relative %: 55.1 % (ref 43.0–77.0)
Platelets: 358 10*3/uL (ref 150.0–400.0)
RBC: 4.91 Mil/uL (ref 3.87–5.11)
RDW: 14.4 % (ref 11.5–15.5)
WBC: 4.9 10*3/uL (ref 4.0–10.5)

## 2021-09-29 LAB — COMPREHENSIVE METABOLIC PANEL
ALT: 43 U/L — ABNORMAL HIGH (ref 0–35)
AST: 49 U/L — ABNORMAL HIGH (ref 0–37)
Albumin: 4.8 g/dL (ref 3.5–5.2)
Alkaline Phosphatase: 57 U/L (ref 39–117)
BUN: 13 mg/dL (ref 6–23)
CO2: 25 mEq/L (ref 19–32)
Calcium: 10.6 mg/dL — ABNORMAL HIGH (ref 8.4–10.5)
Chloride: 103 mEq/L (ref 96–112)
Creatinine, Ser: 0.64 mg/dL (ref 0.40–1.20)
GFR: 100.02 mL/min (ref >60.00)
Glucose, Bld: 108 mg/dL — ABNORMAL HIGH (ref 70–99)
Potassium: 4.1 mEq/L (ref 3.5–5.1)
Sodium: 139 mEq/L (ref 135–145)
Total Bilirubin: 0.4 mg/dL (ref 0.2–1.2)
Total Protein: 7.4 g/dL (ref 6.0–8.3)

## 2021-10-06 NOTE — Telephone Encounter (Signed)
PA was denied due to medication is excluded as a plan benefit.  Drugs used for a diagnosis of weight loss ate not covered by the plan.  Please review and advise.  ?Thanks. Dm/cma ? ?

## 2021-10-07 NOTE — Telephone Encounter (Signed)
Called number and unable to leave VM due to it was full will try later. Dm/cma ? ?

## 2021-10-07 NOTE — Telephone Encounter (Signed)
Spoke to patient and she would like to know if she can get a referral to both.  ?Please review and advise.  ?Thanks. Dm/cma ? ?

## 2021-10-10 NOTE — Telephone Encounter (Signed)
Orders placed.

## 2021-10-10 NOTE — Addendum Note (Signed)
Addended by: Vance Peper A on: 10/10/2021 07:55 AM ? ? Modules accepted: Orders ? ?

## 2021-11-02 ENCOUNTER — Encounter: Payer: Self-pay | Admitting: Nurse Practitioner

## 2021-11-02 ENCOUNTER — Ambulatory Visit (INDEPENDENT_AMBULATORY_CARE_PROVIDER_SITE_OTHER): Payer: 59 | Admitting: Nurse Practitioner

## 2021-11-02 VITALS — BP 116/82 | HR 89 | Wt 199.6 lb

## 2021-11-02 DIAGNOSIS — M7989 Other specified soft tissue disorders: Secondary | ICD-10-CM | POA: Diagnosis not present

## 2021-11-02 DIAGNOSIS — R7989 Other specified abnormal findings of blood chemistry: Secondary | ICD-10-CM | POA: Diagnosis not present

## 2021-11-02 DIAGNOSIS — R7301 Impaired fasting glucose: Secondary | ICD-10-CM | POA: Diagnosis not present

## 2021-11-02 MED ORDER — HYDROCHLOROTHIAZIDE 25 MG PO TABS: 25.0000 mg | ORAL_TABLET | Freq: Every day | ORAL | 0 refills | Status: DC

## 2021-11-02 NOTE — Patient Instructions (Signed)
It was great to see you! ? ?We are checking your labs today and will let you know the results via mychart/phone.  ? ?We are going to order an ultrasound of your right leg to make sure there are no blood clots ? ?Stop amlodipine and start hydrochlorothiazide 1 tablet daily for your blood pressure. Keep checking your blood pressure routinely.  ? ?[facility] [employer] [facility] ?[phone] ? ?Let's follow-up in 4 weeks, sooner if you have concerns. ? ?If a referral was placed today, you will be contacted for an appointment. Please note that routine referrals can sometimes take up to 3-4 weeks to process. Please call our office if you haven't heard anything after this time frame. ? ?Take care, ? ?Vance Peper, NP ? ?

## 2021-11-02 NOTE — Progress Notes (Signed)
? ?Acute Visit ? ?BP 116/82 (BP Location: Left Arm, Patient Position: Sitting, Cuff Size: Normal)   Pulse 89   Wt 199 lb 9.6 oz (90.5 kg)   SpO2 97%   BMI 35.36 kg/m?   ? ?Subjective:  ? ? Patient ID: Dashauna Heymann, female    DOB: [birth year], 56 y.o.   MRN: [id number] ? ?CC: ?Chief Complaint  ?Patient presents with  ? Foot Swelling  ?  Pt c/o swelling, tenderness and numbness in rt foot x2 wks  ? ? ?HPI: ?Guyla Bless is a 56 y.o. female who presents with right foot swelling, for 2 weeks. She has been propping her foot up. She has cut back on her salt. She is having some intermittent numbness when her foot is really swollen. She denies chest pain. She has been having some shortness of breath with exertion.  She states that she sits frequently at her job.  She also has been having a little bit of pain in the back of her right calf. ? ?Past Medical History:  ?Diagnosis Date  ? Allergy   ? Arthritis   ? GERD (gastroesophageal reflux disease)   ? Hypertension   ? 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 Few cells suggesting high-grade lesion.  Colposcopy showed inflammatory changes.  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  Recommend follow-up Pap smear 1 year  ? Peptic ulcer   ? Upper GI bleed 10/28/2017  ? ? ?Past Surgical History:  ?Procedure Laterality Date  ? DILATION AND CURETTAGE OF UTERUS    ? ESOPHAGOGASTRODUODENOSCOPY N/A 10/30/2017  ? Procedure: ESOPHAGOGASTRODUODENOSCOPY (EGD);  Surgeon: Mauri Pole, MD;  Location: [facility];  Service: Endoscopy;  Laterality: N/A;  ? ? ?Family History  ?Problem Relation Age of Onset  ? Stroke Sister 4  ? Hypertension Sister   ? Depression Sister   ? Heart attack Sister 30  ? Colon cancer Neg Hx   ? Colon polyps Neg Hx   ? Esophageal cancer Neg Hx   ? Rectal cancer Neg Hx   ? Stomach cancer Neg Hx   ?  ? ?Current Outpatient Medications on File Prior to Visit  ?Medication Sig Dispense Refill  ? Cyanocobalamin (B-12 PO) Take by mouth.    ? gabapentin  (NEURONTIN) 300 MG capsule Take 1 capsule (300 mg total) by mouth at bedtime. 30 capsule 2  ? pantoprazole (PROTONIX) 40 MG tablet Take 1 tablet (40 mg total) by mouth daily. 90 tablet 1  ? ?No current facility-administered medications on file prior to visit.  ?  ? ?Review of Systems ?See pertinent positives and negatives per HPI. ?   ?Objective:  ?  ?BP 116/82 (BP Location: Left Arm, Patient Position: Sitting, Cuff Size: Normal)   Pulse 89   Wt 199 lb 9.6 oz (90.5 kg)   SpO2 97%   BMI 35.36 kg/m?   ?Wt Readings from Last 3 Encounters:  ?11/02/21 199 lb 9.6 oz (90.5 kg)  ?09/28/21 198 lb 12.8 oz (90.2 kg)  ?09/21/21 196 lb (88.9 kg)  ?  ?BP Readings from Last 3 Encounters:  ?11/02/21 116/82  ?09/28/21 122/84  ?09/21/21 133/89  ?  ?Physical Exam ?Vitals and nursing note reviewed.  ?Constitutional:   ?   General: She is not in acute distress. ?   Appearance: Normal appearance.  ?HENT:  ?   Head: Normocephalic.  ?Eyes:  ?   Conjunctiva/sclera: Conjunctivae normal.  ?Cardiovascular:  ?   Rate and Rhythm: Normal rate and regular rhythm.  ?  Pulses: Normal pulses.  ?   Heart sounds: Normal heart sounds.  ?Pulmonary:  ?   Effort: Pulmonary effort is normal.  ?   Breath sounds: Normal breath sounds.  ?Musculoskeletal:  ?   Cervical back: Normal range of motion.  ?   Right lower leg: Edema (2+ in ankles and foot) present.  ?   Left lower leg: Edema (trace in ankle) present.  ?Skin: ?   General: Skin is warm.  ?Neurological:  ?   General: No focal deficit present.  ?   Mental Status: She is alert and oriented to person, place, and time.  ?Psychiatric:     ?   Mood and Affect: Mood normal.     ?   Behavior: Behavior normal.     ?   Thought Content: Thought content normal.     ?   Judgment: Judgment normal.  ? ? ?CMP  ?   ?Component Value Date/Time  ? NA 139 09/28/2021 1629  ? NA 141 11/13/2017 1633  ? K 4.1 09/28/2021 1629  ? CL 103 09/28/2021 1629  ? CO2 25 09/28/2021 1629  ? GLUCOSE 108 (H) 09/28/2021 1629  ? BUN 13  09/28/2021 1629  ? BUN 9 11/13/2017 1633  ? CREATININE 0.64 09/28/2021 1629  ? CREATININE 0.70 02/21/2017 1452  ? CALCIUM 10.6 (H) 09/28/2021 1629  ? PROT 7.4 09/28/2021 1629  ? PROT 6.6 11/13/2017 1633  ? ALBUMIN 4.8 09/28/2021 1629  ? ALBUMIN 4.5 11/13/2017 1633  ? AST 49 (H) 09/28/2021 1629  ? ALT 43 (H) 09/28/2021 1629  ? ALKPHOS 57 09/28/2021 1629  ? BILITOT 0.4 09/28/2021 1629  ? BILITOT 0.4 11/13/2017 1633  ? GFRNONAA 103 11/13/2017 1633  ? [facility] >89 03/27/2016 1559  ? GFRAA 119 11/13/2017 1633  ? GFRAA >89 03/27/2016 1559  ?  ?CBC ?   ?Component Value Date/Time  ? WBC 4.9 09/28/2021 1629  ? RBC 4.91 09/28/2021 1629  ? HGB 14.4 09/28/2021 1629  ? HGB 11.0 (L) 11/13/2017 1633  ? HCT 43.6 09/28/2021 1629  ? HCT 34.0 11/13/2017 1633  ? PLT 358.0 09/28/2021 1629  ? PLT 403 11/13/2017 1633  ? MCV 88.8 09/28/2021 1629  ? MCV 83 11/13/2017 1633  ? MCH 26.8 11/13/2017 1633  ? MCH 28.1 10/31/2017 0507  ? MCHC 32.9 09/28/2021 1629  ? RDW 14.4 09/28/2021 1629  ? RDW 16.3 (H) 11/13/2017 1633  ? LYMPHSABS 1.5 09/28/2021 1629  ? LYMPHSABS 1.7 11/13/2017 1633  ? MONOABS 0.4 09/28/2021 1629  ? EOSABS 0.3 09/28/2021 1629  ? EOSABS 0.6 (H) 11/13/2017 1633  ? BASOSABS 0.0 09/28/2021 1629  ? BASOSABS 0.0 11/13/2017 1633  ?  ?Lipid Panel  ?   ?Component Value Date/Time  ? CHOL 214 (H) 05/02/2017 1618  ? TRIG 172 (H) 05/02/2017 1618  ? HDL 59 05/02/2017 1618  ? CHOLHDL 3.6 05/02/2017 1618  ? VLDL 21 05/08/2016 1026  ? [place] 126 (H) 05/02/2017 1618  ?  ?Results for orders placed or performed in visit on 09/28/21  ?CBC with Differential/Platelet  ?Result Value Ref Range  ? WBC 4.9 4.0 - 10.5 K/uL  ? RBC 4.91 3.87 - 5.11 Mil/uL  ? Hemoglobin 14.4 12.0 - 15.0 g/dL  ? HCT 43.6 36.0 - 46.0 %  ? MCV 88.8 78.0 - 100.0 fl  ? MCHC 32.9 30.0 - 36.0 g/dL  ? RDW 14.4 11.5 - 15.5 %  ? Platelets 358.0 150.0 - 400.0 K/uL  ? Neutrophils Relative % 55.1  43.0 - 77.0 %  ? Lymphocytes Relative 31.0 12.0 - 46.0 %  ? Monocytes Relative 7.8 3.0 -  12.0 %  ? Eosinophils Relative 5.3 (H) 0.0 - 5.0 %  ? Basophils Relative 0.8 0.0 - 3.0 %  ? Neutro Abs 2.7 1.4 - 7.7 K/uL  ? Lymphs Abs 1.5 0.7 - 4.0 K/uL  ? Monocytes Absolute 0.4 0.1 - 1.0 K/uL  ? Eosinophils Absolute 0.3 0.0 - 0.7 K/uL  ? Basophils Absolute 0.0 0.0 - 0.1 K/uL  ?Comprehensive metabolic panel  ?Result Value Ref Range  ? Sodium 139 135 - 145 mEq/L  ? Potassium 4.1 3.5 - 5.1 mEq/L  ? Chloride 103 96 - 112 mEq/L  ? CO2 25 19 - 32 mEq/L  ? Glucose, Bld 108 (H) 70 - 99 mg/dL  ? BUN 13 6 - 23 mg/dL  ? Creatinine, Ser 0.64 0.40 - 1.20 mg/dL  ? Total Bilirubin 0.4 0.2 - 1.2 mg/dL  ? Alkaline Phosphatase 57 39 - 117 U/L  ? AST 49 (H) 0 - 37 U/L  ? ALT 43 (H) 0 - 35 U/L  ? Total Protein 7.4 6.0 - 8.3 g/dL  ? Albumin 4.8 3.5 - 5.2 g/dL  ? GFR 100.02 >60.00 mL/min  ? Calcium 10.6 (H) 8.4 - 10.5 mg/dL  ? ?   ?Assessment & Plan:  ? ?Problem List Items Addressed This Visit   ? ?  ? Endocrine  ? IFG (impaired fasting glucose)  ?  Glucose has been elevated on several prior labs.  We will check A1c today ? ?  ?  ? Relevant Orders  ? Comprehensive metabolic panel  ? Hemoglobin A1c  ?  ? Other  ? Elevated LFTs  ?  LFTs were slightly elevated on last labs.  We will repeat CMP today. ? ?  ?  ? Relevant Orders  ? Comprehensive metabolic panel  ? Swelling of right foot - Primary  ?  She has noticed swelling in her right foot for the past 2 weeks.  She has tried limiting the amount of salt in her diet, however the swelling is worsening.  She does have a little bit of shortness of breath with exertion, however no shortness of breath when laying flat.  Denies chest pain.  She does have a little bit of pain she states intermittently in the back of her calf.  We will check BNP and ultrasound to rule out DVT.  Encouraged her to continue elevating her feet when sitting and she can use compression socks to help with the swelling.  She is also taking amlodipine 5 mg daily for her blood pressure.  We will stop this medication in  case this is causing the swelling and start her on HCTZ 25 mg daily.  Encouraged her to continue checking her blood pressure daily at home.  Follow-up in 4 weeks. ? ?  ?  ? Relevant Orders  ? B Nat Peptide  ? US Venous Img

## 2021-11-02 NOTE — Assessment & Plan Note (Signed)
She has noticed swelling in her right foot for the past 2 weeks.  She has tried limiting the amount of salt in her diet, however the swelling is worsening.  She does have a little bit of shortness of breath with exertion, however no shortness of breath when laying flat.  Denies chest pain.  She does have a little bit of pain she states intermittently in the back of her calf.  We will check BNP and ultrasound to rule out DVT.  Encouraged her to continue elevating her feet when sitting and she can use compression socks to help with the swelling.  She is also taking amlodipine 5 mg daily for her blood pressure.  We will stop this medication in case this is causing the swelling and start her on HCTZ 25 mg daily.  Encouraged her to continue checking her blood pressure daily at home.  Follow-up in 4 weeks. ?

## 2021-11-02 NOTE — Assessment & Plan Note (Signed)
LFTs were slightly elevated on last labs.  We will repeat CMP today. ?

## 2021-11-02 NOTE — Assessment & Plan Note (Signed)
Glucose has been elevated on several prior labs.  We will check A1c today ?

## 2021-11-03 LAB — COMPREHENSIVE METABOLIC PANEL
ALT: 35 U/L (ref 0–35)
AST: 28 U/L (ref 0–37)
Albumin: 4.5 g/dL (ref 3.5–5.2)
Alkaline Phosphatase: 51 U/L (ref 39–117)
BUN: 12 mg/dL (ref 6–23)
CO2: 26 mEq/L (ref 19–32)
Calcium: 9.5 mg/dL (ref 8.4–10.5)
Chloride: 104 mEq/L (ref 96–112)
Creatinine, Ser: 0.66 mg/dL (ref 0.40–1.20)
GFR: 99.22 mL/min (ref >60.00)
Glucose, Bld: 95 mg/dL (ref 70–99)
Potassium: 4.2 mEq/L (ref 3.5–5.1)
Sodium: 137 mEq/L (ref 135–145)
Total Bilirubin: 0.4 mg/dL (ref 0.2–1.2)
Total Protein: 7.1 g/dL (ref 6.0–8.3)

## 2021-11-03 LAB — BRAIN NATRIURETIC PEPTIDE: Pro B Natriuretic peptide (BNP): 21 pg/mL (ref 0.0–100.0)

## 2021-11-03 LAB — HEMOGLOBIN A1C: Hgb A1c MFr Bld: 5.4 % (ref 4.6–6.5)

## 2021-11-04 ENCOUNTER — Other Ambulatory Visit: Payer: Self-pay | Admitting: Nurse Practitioner

## 2021-11-04 ENCOUNTER — Telehealth: Payer: Self-pay

## 2021-11-04 DIAGNOSIS — M7989 Other specified soft tissue disorders: Secondary | ICD-10-CM

## 2021-11-04 NOTE — Progress Notes (Signed)
New order for ultrasound to rule out DVT placed ?

## 2021-11-04 NOTE — Telephone Encounter (Signed)
Called and informed pt of reorder of [place] and TAT when she will be contacted, explained to give until Tuesday of next week for someone to reach out. Pt voiced understanding. Sw, cma ?

## 2021-11-10 NOTE — Telephone Encounter (Signed)
Pt is wanting a call back concerning this message. [phone] (Mobile)

## 2021-11-14 ENCOUNTER — Telehealth: Payer: Self-pay | Admitting: Nurse Practitioner

## 2021-11-14 NOTE — Telephone Encounter (Signed)
Pt would like something called in to stop the flow of bleeding she is having due to the reason she needs for her referral. Please call her and let her know about the medication.

## 2021-11-14 NOTE — Telephone Encounter (Signed)
Pt is wanting a call back concerning a referral we gave her for a gynecological reason. She "didn't won't to get graphic", but this will not work at this location because they can not see her until the 12th. She wants a call back concerning this issue. Please advise pt at [phone]

## 2021-11-15 ENCOUNTER — Emergency Department (HOSPITAL_BASED_OUTPATIENT_CLINIC_OR_DEPARTMENT_OTHER)
Admission: EM | Admit: 2021-11-15 | Discharge: 2021-11-15 | Disposition: A | Discharge status: Home / Self Care | Payer: 59 | Attending: Emergency Medicine | Admitting: Emergency Medicine

## 2021-11-15 ENCOUNTER — Other Ambulatory Visit: Payer: Self-pay

## 2021-11-15 ENCOUNTER — Encounter (HOSPITAL_BASED_OUTPATIENT_CLINIC_OR_DEPARTMENT_OTHER): Payer: Self-pay

## 2021-11-15 ENCOUNTER — Ambulatory Visit (HOSPITAL_BASED_OUTPATIENT_CLINIC_OR_DEPARTMENT_OTHER)
Admission: RE | Admit: 2021-11-15 | Discharge: 2021-11-15 | Disposition: A | Discharge status: Home / Self Care | Payer: 59 | Source: Ambulatory Visit

## 2021-11-15 DIAGNOSIS — E876 Hypokalemia: Secondary | ICD-10-CM | POA: Insufficient documentation

## 2021-11-15 DIAGNOSIS — Z79899 Other long term (current) drug therapy: Secondary | ICD-10-CM | POA: Diagnosis not present

## 2021-11-15 DIAGNOSIS — M7989 Other specified soft tissue disorders: Secondary | ICD-10-CM | POA: Insufficient documentation

## 2021-11-15 DIAGNOSIS — N939 Abnormal uterine and vaginal bleeding, unspecified: Secondary | ICD-10-CM | POA: Insufficient documentation

## 2021-11-15 DIAGNOSIS — I10 Essential (primary) hypertension: Secondary | ICD-10-CM | POA: Insufficient documentation

## 2021-11-15 DIAGNOSIS — R748 Abnormal levels of other serum enzymes: Secondary | ICD-10-CM | POA: Diagnosis not present

## 2021-11-15 LAB — COMPREHENSIVE METABOLIC PANEL
ALT: 31 U/L (ref 0–44)
AST: 23 U/L (ref 15–41)
Albumin: 4.6 g/dL (ref 3.5–5.0)
Alkaline Phosphatase: 47 U/L (ref 38–126)
Anion gap: 12 (ref 5–15)
BUN: 15 mg/dL (ref 6–20)
CO2: 29 mmol/L (ref 22–32)
Calcium: 9.8 mg/dL (ref 8.9–10.3)
Chloride: 101 mmol/L (ref 98–111)
Creatinine, Ser: 0.87 mg/dL (ref 0.44–1.00)
GFR, Estimated: >60 mL/min (ref >60)
Glucose, Bld: 123 mg/dL — ABNORMAL HIGH (ref 70–99)
Potassium: 3 mmol/L — ABNORMAL LOW (ref 3.5–5.1)
Sodium: 142 mmol/L (ref 135–145)
Total Bilirubin: 0.5 mg/dL (ref 0.3–1.2)
Total Protein: 7.4 g/dL (ref 6.5–8.1)

## 2021-11-15 LAB — CBC
HCT: 42.3 % (ref 36.0–46.0)
Hemoglobin: 13.6 g/dL (ref 12.0–15.0)
MCH: 28.6 pg (ref 26.0–34.0)
MCHC: 32.2 g/dL (ref 30.0–36.0)
MCV: 89.1 fL (ref 80.0–100.0)
Platelets: 352 10*3/uL (ref 150–400)
RBC: 4.75 MIL/uL (ref 3.87–5.11)
RDW: 13.2 % (ref 11.5–15.5)
WBC: 4.9 10*3/uL (ref 4.0–10.5)
nRBC: 0 % (ref 0.0–0.2)

## 2021-11-15 LAB — URINALYSIS, ROUTINE W REFLEX MICROSCOPIC
Bilirubin Urine: NEGATIVE
Glucose, UA: NEGATIVE mg/dL
Ketones, ur: NEGATIVE mg/dL
Leukocytes,Ua: NEGATIVE
Nitrite: NEGATIVE
Protein, ur: NEGATIVE mg/dL
Specific Gravity, Urine: >1.03 — ABNORMAL HIGH (ref 1.005–1.030)
pH: 6 (ref 5.0–8.0)

## 2021-11-15 LAB — URINALYSIS, MICROSCOPIC (REFLEX)

## 2021-11-15 LAB — PREGNANCY, URINE: Preg Test, Ur: NEGATIVE

## 2021-11-15 LAB — LIPASE, BLOOD: Lipase: 111 U/L — ABNORMAL HIGH (ref 11–51)

## 2021-11-15 LAB — HCG, SERUM, QUALITATIVE: Preg, Serum: NEGATIVE

## 2021-11-15 MED ORDER — ACETAMINOPHEN 500 MG PO TABS
1000.0000 mg | ORAL_TABLET | Freq: Once | ORAL | Status: AC
  Administered 2021-11-15: 1000 mg via ORAL
  Filled 2021-11-15: qty 2

## 2021-11-15 MED ORDER — ONDANSETRON HCL 4 MG/2ML IJ SOLN
4.0000 mg | Freq: Once | INTRAMUSCULAR | Status: AC
  Administered 2021-11-15: 4 mg via INTRAVENOUS
  Filled 2021-11-15: qty 2

## 2021-11-15 MED ORDER — POTASSIUM CHLORIDE CRYS ER 20 MEQ PO TBCR: 20.0000 meq | EXTENDED_RELEASE_TABLET | Freq: Every day | ORAL | 0 refills | Status: DC

## 2021-11-15 MED ORDER — POTASSIUM CHLORIDE CRYS ER 20 MEQ PO TBCR
40.0000 meq | EXTENDED_RELEASE_TABLET | Freq: Once | ORAL | Status: AC
  Administered 2021-11-15: 40 meq via ORAL
  Filled 2021-11-15: qty 2

## 2021-11-15 NOTE — Discharge Instructions (Addendum)
Potassium was found to be low. Please read the attached information.  Drink plenty of water.   Take Ibuprofen '600mg'$  every 6-8 hours - THIS WILL HELP THE BLEEDING.   Follow up with your primary care provider within 1 week to have you labs re-checked. As discussed your pancreatic enzymes (lipase) was slightly elevated.

## 2021-11-15 NOTE — ED Provider Notes (Signed)
[place] [facility] EMERGENCY DEPT Provider Note   CSN: [id number] Arrival date & time: 11/15/21  1603     History  Chief Complaint  Patient presents with   Vaginal Bleeding    Anita Holland is a 56 y.o. female.   Vaginal Bleeding  Patient is a 56 year old female with past medical history significant for HTN, reflux, allergies, arthritis  Patient states that 5/10 she started a menstrual cycle since that time she has been experiencing significant vaginal bleeding.  Seems that she is going through a pad every 3 hours or so.  She states that she has felt more fatigued but denies any short of breath or lightheaded symptoms.  She denies any chest pain difficulty breathing nausea vomiting or abdominal pain.  2 weeks ago patient was started on HCTZ she was switched from amlodipine.  This was a control her blood pressure.       Home Medications Prior to Admission medications   Medication Sig Start Date End Date Taking? Authorizing Provider  potassium chloride SA (KLOR-CON M) 20 MEQ tablet Take 1 tablet (20 mEq total) by mouth daily for 5 days. 11/15/21 11/20/21 Yes Aneli Zara S, PA  Cyanocobalamin (B-12 PO) Take by mouth.    [provider]  gabapentin (NEURONTIN) 300 MG capsule Take 1 capsule (300 mg total) by mouth at bedtime. 08/17/21   McElwee, Lauren A, NP  hydrochlorothiazide (HYDRODIURIL) 25 MG tablet Take 1 tablet (25 mg total) by mouth daily. 11/02/21   McElwee, Lauren A, NP  pantoprazole (PROTONIX) 40 MG tablet Take 1 tablet (40 mg total) by mouth daily. 08/17/21   McElwee, Scheryl Darter, NP      Allergies    Patient has no known allergies.    Review of Systems   Review of Systems  Genitourinary:  Positive for vaginal bleeding.   Physical Exam Updated Vital Signs BP 116/80   Pulse 85   Temp 98.3 F (36.8 C)   Resp (!) 23   Ht '5\' 3"'$  (1.6 m)   Wt 90.5 kg   SpO2 96%   BMI 35.34 kg/m  Physical Exam Vitals and nursing note reviewed.  Constitutional:       General: She is not in acute distress. HENT:     Head: Normocephalic and atraumatic.     Nose: Nose normal.  Eyes:     General: No scleral icterus. Cardiovascular:     Rate and Rhythm: Normal rate and regular rhythm.     Pulses: Normal pulses.     Heart sounds: Normal heart sounds.  Pulmonary:     Effort: Pulmonary effort is normal. No respiratory distress.     Breath sounds: No wheezing.  Abdominal:     Palpations: Abdomen is soft.     Tenderness: There is abdominal tenderness.     Comments: Epigastric TTP  Musculoskeletal:     Cervical back: Normal range of motion.     Right lower leg: No edema.     Left lower leg: No edema.  Skin:    General: Skin is warm and dry.     Capillary Refill: Capillary refill takes less than 2 seconds.  Neurological:     Mental Status: She is alert. Mental status is at baseline.  Psychiatric:        Mood and Affect: Mood normal.        Behavior: Behavior normal.    ED Results / Procedures / Treatments   Labs (all labs ordered are listed, but only abnormal results  are displayed) Labs Reviewed  URINALYSIS, ROUTINE W REFLEX MICROSCOPIC - Abnormal; Notable for the following components:      Result Value   Specific Gravity, Urine >1.030 (*)    Hgb urine dipstick LARGE (*)    All other components within normal limits  LIPASE, BLOOD - Abnormal; Notable for the following components:   Lipase 111 (*)    All other components within normal limits  COMPREHENSIVE METABOLIC PANEL - Abnormal; Notable for the following components:   Potassium 3.0 (*)    Glucose, Bld 123 (*)    All other components within normal limits  URINALYSIS, MICROSCOPIC (REFLEX) - Abnormal; Notable for the following components:   Bacteria, UA RARE (*)    All other components within normal limits  PREGNANCY, URINE  CBC  HCG, SERUM, QUALITATIVE    EKG None  Radiology VAS [place] LOWER EXTREMITY VENOUS (DVT)  Result Date: 11/15/2021  Lower Venous DVT Study Patient Name:   Anita Holland  Date of Exam:   11/15/2021 Medical Rec #: [id number]      Accession #:    [id number] Date of Birth: [birth year]       Patient Gender: F Patient Age:   89 years Exam Location:  [facility] Procedure:      VAS [place] LOWER EXTREMITY VENOUS (DVT) Referring Phys: Vance Peper --------------------------------------------------------------------------------  Indications: Right foot swelling.  Limitations: Poor ultrasound/tissue interface. Comparison Study: No previous exams Performing Technologist: Jody Hill RVT, RDMS  Examination Guidelines: A complete evaluation includes B-mode imaging, spectral Doppler, color Doppler, and power Doppler as needed of all accessible portions of each vessel. Bilateral testing is considered an integral part of a complete examination. Limited examinations for reoccurring indications may be performed as noted. The reflux portion of the exam is performed with the patient in reverse Trendelenburg.  +---------+---------------+---------+-----------+----------+--------------+ RIGHT    CompressibilityPhasicitySpontaneityPropertiesThrombus Aging +---------+---------------+---------+-----------+----------+--------------+ CFV      Full           Yes      No                                  +---------+---------------+---------+-----------+----------+--------------+ SFJ      Full                                                        +---------+---------------+---------+-----------+----------+--------------+ FV Prox  Full           Yes      Yes                                 +---------+---------------+---------+-----------+----------+--------------+ FV Mid   Full           Yes      Yes                                 +---------+---------------+---------+-----------+----------+--------------+ FV DistalFull           Yes      Yes                                 +---------+---------------+---------+-----------+----------+--------------+  PFV       Full                                                        +---------+---------------+---------+-----------+----------+--------------+ POP      Full           Yes      Yes                                 +---------+---------------+---------+-----------+----------+--------------+ PTV      Full                                                        +---------+---------------+---------+-----------+----------+--------------+ PERO     Full                                                        +---------+---------------+---------+-----------+----------+--------------+   +----+---------------+---------+-----------+----------+--------------+ LEFTCompressibilityPhasicitySpontaneityPropertiesThrombus Aging +----+---------------+---------+-----------+----------+--------------+ CFV Full           Yes      Yes                                 +----+---------------+---------+-----------+----------+--------------+     Summary: RIGHT: - There is no evidence of deep vein thrombosis in the lower extremity. - There is no evidence of superficial venous thrombosis.  - No cystic structure found in the popliteal fossa.  LEFT: - No evidence of common femoral vein obstruction.  *See table(s) above for measurements and observations. Electronically signed by Servando Snare MD on 11/15/2021 at 5:26:52 PM.    Final     Procedures Procedures    Medications Ordered in ED Medications  ondansetron [facility]) injection 4 mg (4 mg Intravenous Given 11/15/21 1723)  acetaminophen (TYLENOL) tablet 1,000 mg (1,000 mg Oral Given 11/15/21 1722)  potassium chloride SA (KLOR-CON M) CR tablet 40 mEq (40 mEq Oral Given 11/15/21 1821)    ED Course/ Medical Decision Making/ A&P                           Medical Decision Making Amount and/or Complexity of Data Reviewed Labs: ordered.  Risk OTC drugs. Prescription drug management.   This patient presents to the ED for concern of vaginal bleeding, fatigue  this involves a number of treatment options, and is a complaint that carries with it a high risk of complications and morbidity.    Co morbidities: Discussed in HPI   Brief History:  Patient is a 56 year old female with past medical history significant for HTN, reflux, allergies, arthritis  Patient states that 5/10 she started a menstrual cycle since that time she has been experiencing significant vaginal bleeding.  Seems that she is going through a pad every 3 hours or so.  She states that she has not felt particularly fatigued or short of breath or lightheaded.  She denies  any chest pain difficulty breathing nausea vomiting or abdominal pain.  2 weeks ago patient was started on HCTZ she was switched from amlodipine.  This was a control her blood pressure.       EMR reviewed including pt PMHx, past surgical history and past visits to ER.   See HPI for more details   Lab Tests:   I ordered and independently interpreted labs. Labs notable for hypokalemia of 3 this is in the setting of recent initiation of HCTZ.  Urinalysis with high specific gravity consistent with dehydration.  Urinalysis otherwise unremarkable.  Urine pregnancy test negative.  Lipase marginally elevated she will have to have this rechecked with PCP.  She did have some epigastric tenderness initially but on my reevaluation no abdominal tenderness.  CBC without leukocytosis or anemia.   Imaging Studies:  No imaging studies ordered for this patient    Cardiac Monitoring:  NA EKG non-ischemic   Medicines ordered:  I ordered medication including potassium p.o., Zofran, Tylenol for pain and low potassium Reevaluation of the patient after these medicines showed that the patient improved I have reviewed the patients home medicines and have made adjustments as needed   Critical Interventions:     Consults/Attending Physician      Reevaluation:  After the interventions noted above I re-evaluated  patient and found that they have :improved   Social Determinants of Health:      Problem List / ED Course:  Vaginal bleeding.  No anemia present overall well-appearing with normal vital signs.  Recommend ibuprofen and she already is scheduled to follow-up with OB/GYN. Hypokalemia in the setting of recent HCTZ initiation.  We will provide her with some oral potassium recommend high potassium diet and recommend follow-up for recheck with PCP within the week.   Dispostion:  After consideration of the diagnostic results and the patients response to treatment, I feel that the patent would benefit from close outpatient follow-up with PCP and OB/GYN.  Return precautions were discussed.  Patient is agreeable to plan ambulatory, discharge   Final Clinical Impression(s) / ED Diagnoses Final diagnoses:  Hypokalemia  Vaginal bleeding  Elevated lipase    Rx / DC Orders ED Discharge Orders          Ordered    potassium chloride SA (KLOR-CON M) 20 MEQ tablet  Daily        11/15/21 1808              Tedd Sias, [place] 58/52/77 [id number]    Campbell Stall P, DO 35/36/14 2345

## 2021-11-15 NOTE — ED Notes (Signed)
Pt reports heavy vaginal bleeding x 2-3 weeks, has hx of uterine fibroids.  Left lower pelvic pain yesterday.  Some nausea no vomiting

## 2021-11-15 NOTE — Telephone Encounter (Signed)
Called and spoke to patient, due to current symptoms (extreme heavy menstrual bleeding, mild lightheadedness and headache) pat was advised to seek medical attention at an urgent care or the ED. Patient agreed to go to ED and keep GYN referral appt. Sw, cma

## 2021-11-15 NOTE — ED Triage Notes (Signed)
Patient here POV from Home.  Endorses Vaginal Bleeding for approximately 2-3 Weeks. States it began on her typical Menstrual Cycle but has continued since without stopping.   No Fevers. No Contraceptives. Intermittent Pain in Left Pelvic Area. Moderate Nausea. No Emesis.   NAD Noted during Triage. A&Ox4. GCS 15. Ambulatory.

## 2021-11-18 ENCOUNTER — Ambulatory Visit (INDEPENDENT_AMBULATORY_CARE_PROVIDER_SITE_OTHER): Payer: 59 | Admitting: Nurse Practitioner

## 2021-11-18 ENCOUNTER — Encounter: Payer: Self-pay | Admitting: Nurse Practitioner

## 2021-11-18 ENCOUNTER — Telehealth: Payer: Self-pay | Admitting: Nurse Practitioner

## 2021-11-18 VITALS — BP 130/84 | HR 78 | Temp 97.8°F | Wt 196.8 lb

## 2021-11-18 DIAGNOSIS — I10 Essential (primary) hypertension: Secondary | ICD-10-CM | POA: Diagnosis not present

## 2021-11-18 DIAGNOSIS — M7989 Other specified soft tissue disorders: Secondary | ICD-10-CM | POA: Diagnosis not present

## 2021-11-18 DIAGNOSIS — N939 Abnormal uterine and vaginal bleeding, unspecified: Secondary | ICD-10-CM

## 2021-11-18 MED ORDER — POTASSIUM CHLORIDE CRYS ER 10 MEQ PO TBCR: 10.0000 meq | EXTENDED_RELEASE_TABLET | Freq: Every day | ORAL | 0 refills | Status: DC

## 2021-11-18 MED ORDER — NORETHIN ACE-ETH ESTRAD-FE 1-20 MG-MCG PO TABS: 1.0000 | ORAL_TABLET | Freq: Every day | ORAL | 11 refills | Status: DC

## 2021-11-18 NOTE — Patient Instructions (Signed)
It was great to see you!  We are checking your labs today and will let you know the results via mychart/phone.   Start loestrin fe once a day (birth control) to help stop your bleeding. Keep your appointment with GYN.   Start potassium tablet once a day until you hear from [place] with your lab results.   Let's follow-up if your symptoms worsen or don't improve  Take care,  Vance Peper, NP

## 2021-11-18 NOTE — Telephone Encounter (Signed)
Pt is experiencing heavy bleeding, please call pt back at [phone].

## 2021-11-18 NOTE — Progress Notes (Unsigned)
Acute Office Visit  Subjective:     Patient ID: Anita Holland, female    DOB: 10-Apr-1966, 56 y.o.   MRN: [id number]  Chief Complaint  Patient presents with   Vaginal Bleeding    Patient reports vaginal bleeding since 05/07 with lower abdominal pain, headache and no appetite.     HPI Patient is in today for ongoing vaginal bleeding. She states that she had gone 7 months without a menstrual period, and then in April she had a heavy and long period. This stopped on it's own. Then this month, she started bleeding on 10/30/21 and is still having heavy, ongoing bleeding. She usually needs to change her pads or tampons every 2-3 hours. She has been having heavy cramping, headaches, and fatigue. She went to the ER where she was told to follow-up with GYN and that her blood counts were normal. Of note, her potassium was low, and she was prescribed a potassium supplement, however it was never sent to the pharmacy. She has an appointment with GYN on 12/05/21 and has been trying to call to move it up sooner. She denies chest pain and shortness of breath.   ROS See pertinent positives and negatives per HPI.    Objective:    BP 130/84 (BP Location: Left Arm, Patient Position: Sitting, Cuff Size: Large)   Pulse 78   Temp 97.8 F (36.6 C) (Temporal)   Wt 196 lb 12.8 oz (89.3 kg)   LMP 10/30/2021   SpO2 97%   BMI 34.86 kg/m    Physical Exam Vitals and nursing note reviewed.  Constitutional:      General: She is not in acute distress.    Appearance: Normal appearance.  HENT:     Head: Normocephalic.  Eyes:     Conjunctiva/sclera: Conjunctivae normal.  Pulmonary:     Effort: Pulmonary effort is normal.  Musculoskeletal:     Cervical back: Normal range of motion.  Skin:    General: Skin is warm.  Neurological:     General: No focal deficit present.     Mental Status: She is alert and oriented to person, place, and time.  Psychiatric:        Mood and Affect: Mood normal.        Behavior:  Behavior normal.        Thought Content: Thought content normal.        Judgment: Judgment normal.    Results for orders placed or performed in visit on 12/19/92  Basic Metabolic Panel (BMET)  Result Value Ref Range   Glucose, Bld 94 65 - 99 mg/dL   BUN 13 7 - 25 mg/dL   Creat 0.70 0.50 - 1.03 mg/dL   BUN/Creatinine Ratio NOT APPLICABLE 6 - 22 (calc)   Sodium 138 135 - 146 mmol/L   Potassium 4.2 3.5 - 5.3 mmol/L   Chloride 101 98 - 110 mmol/L   CO2 27 20 - 32 mmol/L   Calcium 9.9 8.6 - 10.4 mg/dL  CBC  Result Value Ref Range   WBC 5.8 3.8 - 10.8 Thousand/uL   RBC 4.20 3.80 - 5.10 Million/uL   Hemoglobin 12.1 11.7 - 15.5 g/dL   HCT 36.7 35.0 - 45.0 %   MCV 87.4 80.0 - 100.0 fL   MCH 28.8 27.0 - 33.0 pg   MCHC 33.0 32.0 - 36.0 g/dL   RDW 12.8 11.0 - 15.0 %   Platelets 370 140 - 400 Thousand/uL   MPV 8.8 7.5 - 12.5 fL  Iron, TIBC and Ferritin Panel  Result Value Ref Range   Iron 64 45 - 160 mcg/dL   TIBC 408 250 - 450 mcg/dL (calc)   %SAT 16 16 - 45 % (calc)   Ferritin 117 16 - 232 ng/mL        Assessment & Plan:   Problem List Items Addressed This Visit       Cardiovascular and Mediastinum   Primary hypertension    Chronic, stable. Last visit she was changed from amlodipine to hctz '25mg'$  daily due to swelling in her feet. The swelling has improved, however her potassium was low when she went to the ER several days ago. Will repeat BMP today and replenish potassium as needed.       Relevant Orders   Basic Metabolic Panel (BMET) (Completed)     Genitourinary   Abnormal uterine bleeding - Primary    She has been having heavy vaginal bleeding since 10/30/21. She has been going through pads and tampons about every 2-3 hours. She does have a history of fibroids and is not sure if these are coming back or growing. She is starting to feel fatigued from the blood loss. Her blood counts have dropped, however not low enough to need infusions. Will check CBC and iron panel today.  She is not post menopausal yet, will start loestrin FE until she can get in with GYN on 12/05/21. Will also call and see if she can get scheduled sooner.        Relevant Orders   CBC (Completed)   Iron, TIBC and Ferritin Panel (Completed)     Other   Swelling of right foot    Improved since changing from amlodipine to HCTZ. Continue HCTZ '25mg'$  daily. Check BMP today.       Relevant Orders   Basic Metabolic Panel (BMET) (Completed)    Meds ordered this encounter  Medications   norethindrone-ethinyl estradiol-FE (LOESTRIN FE 1/20) 1-20 MG-MCG tablet    Sig: Take 1 tablet by mouth daily.    Dispense:  28 tablet    Refill:  11   potassium chloride (KLOR-CON M) 10 MEQ tablet    Sig: Take 1 tablet (10 mEq total) by mouth daily.    Dispense:  30 tablet    Refill:  0    Return if symptoms worsen or fail to improve.  Charyl Dancer, NP

## 2021-11-18 NOTE — Telephone Encounter (Signed)
Patient scheduled 11/18/2021 at 3 pm with Vance Peper, NP.

## 2021-11-19 LAB — CBC
HCT: 36.7 % (ref 35.0–45.0)
Hemoglobin: 12.1 g/dL (ref 11.7–15.5)
MCH: 28.8 pg (ref 27.0–33.0)
MCHC: 33 g/dL (ref 32.0–36.0)
MCV: 87.4 fL (ref 80.0–100.0)
MPV: 8.8 fL (ref 7.5–12.5)
Platelets: 370 10*3/uL (ref 140–400)
RBC: 4.2 10*6/uL (ref 3.80–5.10)
RDW: 12.8 % (ref 11.0–15.0)
WBC: 5.8 10*3/uL (ref 3.8–10.8)

## 2021-11-19 LAB — BASIC METABOLIC PANEL
BUN: 13 mg/dL (ref 7–25)
CO2: 27 mmol/L (ref 20–32)
Calcium: 9.9 mg/dL (ref 8.6–10.4)
Chloride: 101 mmol/L (ref 98–110)
Creat: 0.7 mg/dL (ref 0.50–1.03)
Glucose, Bld: 94 mg/dL (ref 65–99)
Potassium: 4.2 mmol/L (ref 3.5–5.3)
Sodium: 138 mmol/L (ref 135–146)

## 2021-11-19 LAB — IRON,TIBC AND FERRITIN PANEL
%SAT: 16 % (calc) (ref 16–45)
Ferritin: 117 ng/mL (ref 16–232)
Iron: 64 ug/dL (ref 45–160)
TIBC: 408 mcg/dL (calc) (ref 250–450)

## 2021-11-20 DIAGNOSIS — I10 Essential (primary) hypertension: Secondary | ICD-10-CM | POA: Insufficient documentation

## 2021-11-20 NOTE — Assessment & Plan Note (Signed)
Improved since changing from amlodipine to HCTZ. Continue HCTZ '25mg'$  daily. Check BMP today.

## 2021-11-20 NOTE — Assessment & Plan Note (Signed)
She has been having heavy vaginal bleeding since 10/30/21. She has been going through pads and tampons about every 2-3 hours. She does have a history of fibroids and is not sure if these are coming back or growing. She is starting to feel fatigued from the blood loss. Her blood counts have dropped, however not low enough to need infusions. Will check CBC and iron panel today. She is not post menopausal yet, will start loestrin FE until she can get in with GYN on 12/05/21. Will also call and see if she can get scheduled sooner.

## 2021-11-20 NOTE — Assessment & Plan Note (Signed)
Chronic, stable. Last visit she was changed from amlodipine to hctz '25mg'$  daily due to swelling in her feet. The swelling has improved, however her potassium was low when she went to the ER several days ago. Will repeat BMP today and replenish potassium as needed.

## 2021-11-22 ENCOUNTER — Telehealth: Payer: Self-pay

## 2021-11-22 NOTE — Telephone Encounter (Signed)
Called and spoke to rep from [facility], No earlier appts available, pt has been placed on wait-list for all listed providers at practice. Sw, cma

## 2021-11-29 NOTE — Progress Notes (Unsigned)
   Established Patient Office Visit  Subjective   Patient ID: Anita Holland, female    DOB: Dec 22, 1965  Age: 56 y.o. MRN: [id number]  No chief complaint on file.   HPI  Anita Holland is here for   {History (Optional):23778}  ROS    Objective:     There were no vitals taken for this visit. {Vitals History (Optional):23777}  Physical Exam   No results found for any visits on 11/30/21.  {Labs (Optional):23779}  The 10-year ASCVD risk score (Arnett DK, et al., 2019) is: 5.6%    Assessment & Plan:   Problem List Items Addressed This Visit   None   No follow-ups on file.    Charyl Dancer, NP

## 2021-11-30 ENCOUNTER — Ambulatory Visit (INDEPENDENT_AMBULATORY_CARE_PROVIDER_SITE_OTHER): Payer: 59 | Admitting: Nurse Practitioner

## 2021-11-30 ENCOUNTER — Encounter: Payer: Self-pay | Admitting: Nurse Practitioner

## 2021-11-30 VITALS — BP 122/80 | HR 88 | Temp 97.1°F | Ht 63.0 in | Wt 196.0 lb

## 2021-11-30 DIAGNOSIS — N939 Abnormal uterine and vaginal bleeding, unspecified: Secondary | ICD-10-CM | POA: Diagnosis not present

## 2021-11-30 DIAGNOSIS — I10 Essential (primary) hypertension: Secondary | ICD-10-CM | POA: Diagnosis not present

## 2021-11-30 NOTE — Patient Instructions (Signed)
It was great to see you!  Keep taking hydrochlorothiazide 1 tablet daily.   Let's follow-up in 3 months, sooner if you have concerns.  If a referral was placed today, you will be contacted for an appointment. Please note that routine referrals can sometimes take up to 3-4 weeks to process. Please call our office if you haven't heard anything after this time frame.  Take care,  Vance Peper, NP

## 2021-11-30 NOTE — Assessment & Plan Note (Signed)
Chronic, stable.  We will continue hydrochlorothiazide 25 mg daily.  BMP checked last visit and potassium and kidneys were within normal limits.  Follow-up in 3 months.

## 2021-11-30 NOTE — Assessment & Plan Note (Signed)
Uterine bleeding has stopped after starting the OCP pill.  She states that she is feeling more energy back and less fatigue.  She has an appointment with GYN on Monday.  Encouraged her to keep this appointment and GYN will let her know if she should continue this medication.

## 2021-12-05 ENCOUNTER — Ambulatory Visit (INDEPENDENT_AMBULATORY_CARE_PROVIDER_SITE_OTHER): Payer: 59 | Admitting: Obstetrics and Gynecology

## 2021-12-05 ENCOUNTER — Encounter: Payer: Self-pay | Admitting: Obstetrics and Gynecology

## 2021-12-05 ENCOUNTER — Other Ambulatory Visit (HOSPITAL_COMMUNITY)
Admission: RE | Admit: 2021-12-05 | Discharge: 2021-12-05 | Disposition: A | Discharge status: Home / Self Care | Payer: 59 | Source: Ambulatory Visit | Attending: Obstetrics and Gynecology | Admitting: Obstetrics and Gynecology

## 2021-12-05 VITALS — BP 135/86 | HR 85 | Ht 63.0 in | Wt 194.0 lb

## 2021-12-05 DIAGNOSIS — N939 Abnormal uterine and vaginal bleeding, unspecified: Secondary | ICD-10-CM | POA: Diagnosis present

## 2021-12-05 MED ORDER — MEGESTROL ACETATE 40 MG PO TABS: 40.0000 mg | ORAL_TABLET | Freq: Two times a day (BID) | ORAL | 5 refills | Status: DC

## 2021-12-05 NOTE — Progress Notes (Signed)
56 y.o New GYN presents for AUB. C/o painful 3-10/10, heavy periods wearing tampons and pads at the same time.   Last PAP 08/17/21 Mammogram 03/16/21

## 2021-12-05 NOTE — Progress Notes (Signed)
56 yo P0 with BMI 34 and LMP 10/30/21 presenting for the evaluation of abnormal uterine bleeding. Patient states that prior to May, she had not had a period for 7 months. She reports onset of heavy vaginal bleeding requiring changing a pad every 2-3 hours. The bleeding lasted for several weeks and seem to improve following the initiation of loestrin in early June. Patient is occasionally sexually active and denies pelvic pain or abnormal discharge. Patient denies urinary incontinence. 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  Colposcopy showed inflammatory changes.  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  Recommend follow-up Pap smear 1 year   Peptic ulcer    Upper GI bleed 10/28/2017   Past Surgical History:  Procedure Laterality Date   DILATION AND CURETTAGE OF UTERUS     ESOPHAGOGASTRODUODENOSCOPY N/A 10/30/2017   Procedure: ESOPHAGOGASTRODUODENOSCOPY (EGD);  Surgeon: Mauri Pole, MD;  Location: [facility];  Service: Endoscopy;  Laterality: N/A;   Family History  Problem Relation Age of Onset   Stroke Sister 85   Hypertension Sister    Depression Sister    Heart attack Sister 26   Colon cancer Neg Hx    Colon polyps Neg Hx    Esophageal cancer Neg Hx    Rectal cancer Neg Hx    Stomach cancer Neg Hx    Social History   Tobacco Use   Smoking status: Never   Smokeless tobacco: Never  Vaping Use   Vaping Use: Never used  Substance Use Topics   Alcohol use: No   Drug use: No   ROS See pertinent in HPI. All other systems reviewed and non contributory Blood pressure 135/86, pulse 85, height '5\' 3"'$  (1.6 m), weight 194 lb (88 kg), last menstrual period 10/30/2021. GENERAL: Well-developed, well-nourished female in no acute distress.  HEENT:  Normocephalic, atraumatic. Sclerae anicteric.  NECK: Supple. Normal thyroid.  LUNGS: Clear to auscultation bilaterally.  HEART: Regular rate and rhythm. BREASTS: Symmetric in size. No palpable masses or lymphadenopathy, skin changes, or nipple drainage. ABDOMEN: Soft, nontender, nondistended. No organomegaly. PELVIC: Normal external female genitalia. Vagina is pink and rugated.  Normal discharge. Normal appearing cervix. Uterus is normal in size. No adnexal mass or tenderness. Chaperone present during the pelvic exam EXTREMITIES: No cyanosis, clubbing, or edema, 2+ distal pulses.  A/P 56 yo with AUB - Patient with CHTN on medication, will chance medical management from loestrin to megace - vaginal swab collected - pelvic ultrasound ordered - Discussed benefits of endometrial biopsy. ENDOMETRIAL BIOPSY     The indications for endometrial biopsy were reviewed.   Risks of the biopsy including cramping, bleeding, infection, uterine perforation, inadequate specimen and need for additional procedures  were discussed. The patient states she understands and agrees to undergo procedure today. Consent was signed. Time out was performed. Urine HCG was negative. A sterile speculum was placed in the patient's vagina and the cervix was prepped with Betadine. A single-toothed tenaculum was placed on the anterior lip of the cervix to stabilize it. The uterine cavity was sounded to a depth of 12 cm using the uterine sound. The 3 mm pipelle was introduced into the endometrial cavity without difficulty, 2passes were made.  A  moderate amount of tissue was  sent to pathology. The instruments were removed from the patient's vagina. Minimal bleeding from the cervix was noted. The patient tolerated the procedure well.  Routine post-procedure instructions were given to the patient. The patient will follow up in two weeks to review the results and for further management.  - Patient with normal pap smear 07/2021 - screening  mammogram due 02/2022

## 2021-12-07 LAB — SURGICAL PATHOLOGY

## 2021-12-08 LAB — CERVICOVAGINAL ANCILLARY ONLY
Bacterial Vaginitis (gardnerella): NEGATIVE
Candida Glabrata: NEGATIVE
Candida Vaginitis: POSITIVE — AB
Comment: NEGATIVE
Comment: NEGATIVE
Comment: NEGATIVE

## 2021-12-10 ENCOUNTER — Other Ambulatory Visit: Payer: Self-pay | Admitting: Nurse Practitioner

## 2021-12-13 ENCOUNTER — Ambulatory Visit (HOSPITAL_BASED_OUTPATIENT_CLINIC_OR_DEPARTMENT_OTHER)
Admission: RE | Admit: 2021-12-13 | Discharge: 2021-12-13 | Disposition: A | Discharge status: Home / Self Care | Payer: 59 | Source: Ambulatory Visit | Attending: Obstetrics and Gynecology | Admitting: Obstetrics and Gynecology

## 2021-12-13 ENCOUNTER — Other Ambulatory Visit: Payer: Self-pay | Admitting: Obstetrics & Gynecology

## 2021-12-13 DIAGNOSIS — N939 Abnormal uterine and vaginal bleeding, unspecified: Secondary | ICD-10-CM | POA: Diagnosis present

## 2021-12-13 DIAGNOSIS — B3731 Acute candidiasis of vulva and vagina: Secondary | ICD-10-CM

## 2021-12-13 MED ORDER — FLUCONAZOLE 150 MG PO TABS: 150.0000 mg | ORAL_TABLET | Freq: Once | ORAL | 3 refills | Status: AC

## 2021-12-13 NOTE — Telephone Encounter (Signed)
Med disc

## 2021-12-15 ENCOUNTER — Telehealth: Payer: Self-pay | Admitting: Emergency Medicine

## 2021-12-15 NOTE — Telephone Encounter (Signed)
TC to patient re results.

## 2022-01-29 ENCOUNTER — Other Ambulatory Visit: Payer: Self-pay | Admitting: Nurse Practitioner

## 2022-01-30 NOTE — Telephone Encounter (Signed)
Chart supports rx refill Last ov: 11/30/21 Last refill: 11/02/21

## 2022-02-10 ENCOUNTER — Encounter: Payer: Self-pay | Admitting: Nurse Practitioner

## 2022-02-10 ENCOUNTER — Ambulatory Visit (INDEPENDENT_AMBULATORY_CARE_PROVIDER_SITE_OTHER): Payer: 59 | Admitting: Nurse Practitioner

## 2022-02-10 VITALS — BP 132/90 | HR 101 | Wt 201.6 lb

## 2022-02-10 DIAGNOSIS — R1310 Dysphagia, unspecified: Secondary | ICD-10-CM | POA: Diagnosis not present

## 2022-02-10 DIAGNOSIS — G5601 Carpal tunnel syndrome, right upper limb: Secondary | ICD-10-CM

## 2022-02-10 NOTE — Patient Instructions (Addendum)
It was great to see you!  Wear a wrist brace at night. You can get this at any pharamcy, [employer], or [employer]. You can use ice intermittently and take tylenol as needed. I am attaching some stretches that you can do.   I have placed a referral to GI for the food getting stuck in your throat.   Let's follow-up if your symptoms worsen or don't improve.   Take care,  Vance Peper, NP

## 2022-02-10 NOTE — Progress Notes (Unsigned)
Acute Office Visit  Subjective:     Patient ID: Anita Holland, female    DOB: 1965/09/03, 56 y.o.   MRN: [id number]  Chief Complaint  Patient presents with   Hand Pain    Pt c/o RT hand pain w/ swellling x3 wks    HPI Patient is in today for intermittent swelling, numbness, and tingling in her right hand for the last 3 weeks. She states that her hand tends to be swollen in the morning when she wakes up and then gets better during the day. She is right handed has been needing to use her left hand more frequently. The pain does not radiate up her arm. She has not tried any treatments at home.   She also notes that she has been feeling like food gets stuck in her chest when eating at times. It was happening just a few times a month, however now it is happening every other day. She will need to go to the bathroom and vomit to get the food out of her chest. She has been trying to cut her food into smaller pieces and drinks liquids with her meals.   ROS See pertinent positives and negatives per HPI.    Objective:    BP (!) 132/90 (BP Location: Right Arm, Cuff Size: Large)   Pulse (!) 101   Wt 201 lb 9.6 oz (91.4 kg)   SpO2 93%   BMI 35.71 kg/m    Physical Exam Vitals and nursing note reviewed.  Constitutional:      General: She is not in acute distress.    Appearance: Normal appearance.  HENT:     Head: Normocephalic.  Eyes:     Conjunctiva/sclera: Conjunctivae normal.  Pulmonary:     Effort: Pulmonary effort is normal.  Musculoskeletal:        General: No swelling, tenderness or deformity.     Cervical back: Normal range of motion.     Comments: Positive phalen's test right wrist  Skin:    General: Skin is warm.  Neurological:     General: No focal deficit present.     Mental Status: She is alert and oriented to person, place, and time.  Psychiatric:        Mood and Affect: Mood normal.        Behavior: Behavior normal.        Thought Content: Thought content normal.         Judgment: Judgment normal.     No results found for any visits on 02/10/22.      Assessment & Plan:   Problem List Items Addressed This Visit       Digestive   Dysphagia    She has been experiencing food getting suck in her esophagus at the level of her chest. This has increased in frequency and is now happening every other day. She needs to vomit to the get the sensation to go away. Will place referral to GI for further evaluation.      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 Will have her wear a wrist brace at bedtime, take tylenol as needed for pain, and use ice intermittently as well. Stretches given for her to do daily. Follow-up if symptoms don't improve or worsen.        No orders of the  defined types were placed in this encounter.   Return if symptoms worsen or fail to improve.  Charyl Dancer, NP

## 2022-02-11 ENCOUNTER — Other Ambulatory Visit: Payer: Self-pay | Admitting: Nurse Practitioner

## 2022-02-11 DIAGNOSIS — R1310 Dysphagia, unspecified: Secondary | ICD-10-CM | POA: Insufficient documentation

## 2022-02-11 DIAGNOSIS — G5601 Carpal tunnel syndrome, right upper limb: Secondary | ICD-10-CM | POA: Insufficient documentation

## 2022-02-11 NOTE — Assessment & Plan Note (Signed)
She has been experiencing food getting suck in her esophagus at the level of her chest. This has increased in frequency and is now happening every other day. She needs to vomit to the get the sensation to go away. Will place referral to GI for further evaluation.

## 2022-02-11 NOTE — Assessment & Plan Note (Signed)
Phalen's test positive on right wrist and hand. Will have her wear a wrist brace at bedtime, take tylenol as needed for pain, and use ice intermittently as well. Stretches given for her to do daily. Follow-up if symptoms don't improve or worsen.

## 2022-02-14 NOTE — Telephone Encounter (Signed)
Disc by provider

## 2022-05-13 ENCOUNTER — Other Ambulatory Visit: Payer: Self-pay | Admitting: Nurse Practitioner

## 2022-05-15 ENCOUNTER — Other Ambulatory Visit: Payer: Self-pay

## 2022-05-15 MED ORDER — HYDROCHLOROTHIAZIDE 25 MG PO TABS
25.0000 mg | ORAL_TABLET | Freq: Every day | ORAL | 0 refills | Status: DC
Start: 2022-05-15 — End: 2022-06-08

## 2022-05-15 NOTE — Telephone Encounter (Signed)
..  Chart supports rx  Last ov:02/10/22  Last refill: 04/16/22:  Next ov: no schedule appt

## 2022-06-08 ENCOUNTER — Other Ambulatory Visit: Payer: Self-pay

## 2022-06-08 ENCOUNTER — Encounter: Payer: Self-pay | Admitting: Physician Assistant

## 2022-06-08 ENCOUNTER — Telehealth: Payer: Self-pay | Admitting: Nurse Practitioner

## 2022-06-08 MED ORDER — HYDROCHLOROTHIAZIDE 25 MG PO TABS: 25.0000 mg | ORAL_TABLET | Freq: Every day | ORAL | 0 refills | Status: DC

## 2022-06-08 NOTE — Telephone Encounter (Signed)
Caller Name: Kyona Chauncey Call back phone #: [phone]  Reason for Call: Pt does not remember name of medication, it is for bp. She would like sent to  [id number] [place], [place] PYLTEI:[phone]:[phone] Scheduled cpe 12/21

## 2022-06-08 NOTE — Telephone Encounter (Signed)
Called and spoke with patient sent this to the pharmacy

## 2022-06-15 ENCOUNTER — Encounter: Payer: Self-pay | Admitting: Nurse Practitioner

## 2022-06-15 ENCOUNTER — Other Ambulatory Visit: Payer: Self-pay | Admitting: Nurse Practitioner

## 2022-06-15 ENCOUNTER — Ambulatory Visit (INDEPENDENT_AMBULATORY_CARE_PROVIDER_SITE_OTHER): Payer: 59 | Admitting: Nurse Practitioner

## 2022-06-15 VITALS — BP 118/84 | HR 76 | Temp 98.1°F | Wt 206.6 lb

## 2022-06-15 DIAGNOSIS — E559 Vitamin D deficiency, unspecified: Secondary | ICD-10-CM

## 2022-06-15 DIAGNOSIS — D509 Iron deficiency anemia, unspecified: Secondary | ICD-10-CM | POA: Diagnosis not present

## 2022-06-15 DIAGNOSIS — K219 Gastro-esophageal reflux disease without esophagitis: Secondary | ICD-10-CM | POA: Diagnosis not present

## 2022-06-15 DIAGNOSIS — Z13220 Encounter for screening for lipoid disorders: Secondary | ICD-10-CM | POA: Diagnosis not present

## 2022-06-15 DIAGNOSIS — R232 Flushing: Secondary | ICD-10-CM

## 2022-06-15 DIAGNOSIS — Z0000 Encounter for general adult medical examination without abnormal findings: Secondary | ICD-10-CM | POA: Diagnosis not present

## 2022-06-15 DIAGNOSIS — E538 Deficiency of other specified B group vitamins: Secondary | ICD-10-CM

## 2022-06-15 DIAGNOSIS — Z1159 Encounter for screening for other viral diseases: Secondary | ICD-10-CM

## 2022-06-15 DIAGNOSIS — R3915 Urgency of urination: Secondary | ICD-10-CM

## 2022-06-15 DIAGNOSIS — E669 Obesity, unspecified: Secondary | ICD-10-CM

## 2022-06-15 DIAGNOSIS — R7301 Impaired fasting glucose: Secondary | ICD-10-CM | POA: Diagnosis not present

## 2022-06-15 LAB — POCT URINALYSIS DIPSTICK
Bilirubin, UA: NEGATIVE
Glucose, UA: NEGATIVE
Leukocytes, UA: NEGATIVE
Nitrite, UA: NEGATIVE
Protein, UA: POSITIVE — AB
Spec Grav, UA: >=1.03 — AB (ref 1.010–1.025)
Urobilinogen, UA: 1 E.U./dL
pH, UA: 5.5 (ref 5.0–8.0)

## 2022-06-15 MED ORDER — PANTOPRAZOLE SODIUM 40 MG PO TBEC: 40.0000 mg | DELAYED_RELEASE_TABLET | Freq: Every day | ORAL | 1 refills | Status: DC

## 2022-06-15 MED ORDER — ZEPBOUND 2.5 MG/0.5ML ~~LOC~~ SOAJ: 2.5000 mg | SUBCUTANEOUS | 1 refills | Status: DC

## 2022-06-15 NOTE — Assessment & Plan Note (Signed)
Will check A1c today.

## 2022-06-15 NOTE — Progress Notes (Signed)
BP 118/84 (BP Location: Left Arm, Patient Position: Sitting, Cuff Size: Large)   Pulse 76   Temp 98.1 F (36.7 C) (Temporal)   Wt 206 lb 9.6 oz (93.7 kg)   LMP 06/01/2022   SpO2 98%   BMI 36.60 kg/m    Subjective:    Patient ID: Anita Holland, female    DOB: 1966/02/24, 56 y.o.   MRN: [id number]  CC: Chief Complaint  Patient presents with   Annual Exam    Non fasting. Rx refill pantoprazole. Patient mentioned a smell when while spotting.    HPI: Anita Holland is a 56 y.o. female presenting on 06/15/2022 for comprehensive medical examination. Current medical complaints include:urinary urgency  She has been experiencing urinary urgency for the past month. She denies dysuria, frequency, and abdominal pain. She is still having vaginal spotting regularly.   She has also been having a hard time losing weight. She has tried juicing in the past. She has cleared off her treadmill recently to start exercising.   She currently lives with: alone Menopausal Symptoms: yes - vaginal spotting, hot flashes  Depression Screen done today and results listed below:     06/15/2022    8:13 PM 12/05/2021    2:59 PM 08/17/2021    4:23 PM 05/02/2017    3:12 PM 05/01/2016    3:18 PM  Depression screen PHQ 2/9  Decreased Interest 0 0 0 0 0  Down, Depressed, Hopeless 0 0 0 0 0  PHQ - 2 Score 0 0 0 0 0  Altered sleeping  0     Tired, decreased energy  3     Change in appetite  1     Feeling bad or failure about yourself   0     Trouble concentrating  3     Moving slowly or fidgety/restless  0     Suicidal thoughts  0     PHQ-9 Score  7     Difficult doing work/chores  Not difficult at all       The patient does not have a history of falls. I did not complete a risk assessment for falls. A plan of care for falls was not documented.   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  Colposcopy showed inflammatory changes.  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  Recommend follow-up Pap smear 1 year   Peptic ulcer    Upper GI bleed 10/28/2017   Vaginal Pap smear, abnormal     Surgical History:  Past Surgical History:  Procedure Laterality Date   DILATION AND CURETTAGE OF UTERUS     ESOPHAGOGASTRODUODENOSCOPY N/A 10/30/2017   Procedure: ESOPHAGOGASTRODUODENOSCOPY (EGD);  Surgeon: Mauri Pole, MD;  Location: [facility];  Service: Endoscopy;  Laterality: N/A;    Medications:  Current Outpatient Medications on File Prior to Visit  Medication Sig   Cyanocobalamin (B-12 PO) Take by mouth.   gabapentin (NEURONTIN) 300 MG capsule Take 1 capsule (300 mg total) by mouth at bedtime.   hydrochlorothiazide (HYDRODIURIL) 25 MG tablet Take 1 tablet (25 mg total) by mouth daily.   megestrol (MEGACE) 40 MG tablet Take 1 tablet (40 mg total) by mouth 2 (  two) times daily. Can increase to two tablets twice a day in the event of heavy bleeding   No current facility-administered medications on file prior to visit.    Allergies:  No Known Allergies  Social History:  Social History   Socioeconomic History   Marital status: Single    Spouse name: Not on file   Number of children: 8   Years of education: Not on file   Highest education level: Not on file  Occupational History   Not on file  Tobacco Use   Smoking status: Never   Smokeless tobacco: Never  Vaping Use   Vaping Use: Never used  Substance and Sexual Activity   Alcohol use: No   Drug use: No   Sexual activity: Yes    Birth control/protection: Condom    Comment: 1st intercourse 15yo-5 partners  Other Topics Concern   Not on file  Social History Narrative   Not on file   Social Determinants of Health   Financial Resource Strain: Not on file  Food  Insecurity: Not on file  Transportation Needs: Not on file  Physical Activity: Not on file  Stress: Not on file  Social Connections: Not on file  Intimate Partner Violence: Not on file   Social History   Tobacco Use  Smoking Status Never  Smokeless Tobacco Never   Social History   Substance and Sexual Activity  Alcohol Use No    Family History:  Family History  Problem Relation Age of Onset   Stroke Sister 65   Hypertension Sister    Depression Sister    Heart attack Sister 28   Colon cancer Neg Hx    Colon polyps Neg Hx    Esophageal cancer Neg Hx    Rectal cancer Neg Hx    Stomach cancer Neg Hx     Past medical history, surgical history, medications, allergies, family history and social history reviewed with patient today and changes made to appropriate areas of the chart.   Review of Systems  Constitutional:  Positive for malaise/fatigue. Negative for fever.  HENT: Negative.    Respiratory: Negative.    Cardiovascular: Negative.   Gastrointestinal: Negative.   Genitourinary:  Positive for urgency. Negative for frequency.  Musculoskeletal: Negative.   Skin: Negative.   Neurological: Negative.   Psychiatric/Behavioral: Negative.     All other ROS negative except what is listed above and in the HPI.      Objective:    BP 118/84 (BP Location: Left Arm, Patient Position: Sitting, Cuff Size: Large)   Pulse 76   Temp 98.1 F (36.7 C) (Temporal)   Wt 206 lb 9.6 oz (93.7 kg)   LMP 06/01/2022   SpO2 98%   BMI 36.60 kg/m   Wt Readings from Last 3 Encounters:  06/15/22 206 lb 9.6 oz (93.7 kg)  02/10/22 201 lb 9.6 oz (91.4 kg)  12/05/21 194 lb (88 kg)    Physical Exam Vitals and nursing note reviewed.  Constitutional:      General: She is not in acute distress.    Appearance: Normal appearance.  HENT:     Head: Normocephalic and atraumatic.     Right Ear: Tympanic membrane, ear canal and external ear normal.     Left Ear: Tympanic membrane, ear canal  and external ear normal.  Eyes:     Conjunctiva/sclera: Conjunctivae normal.  Cardiovascular:     Rate and Rhythm: Normal rate and regular rhythm.     Pulses: Normal pulses.  Heart sounds: Normal heart sounds.  Pulmonary:     Effort: Pulmonary effort is normal.     Breath sounds: Normal breath sounds.  Abdominal:     Palpations: Abdomen is soft.     Tenderness: There is no abdominal tenderness.  Musculoskeletal:        General: Normal range of motion.     Cervical back: Normal range of motion and neck supple. No tenderness.     Right lower leg: No edema.     Left lower leg: No edema.  Lymphadenopathy:     Cervical: No cervical adenopathy.  Skin:    General: Skin is warm and dry.  Neurological:     General: No focal deficit present.     Mental Status: She is alert and oriented to person, place, and time.     Cranial Nerves: No cranial nerve deficit.     Coordination: Coordination normal.     Gait: Gait normal.  Psychiatric:        Mood and Affect: Mood normal.        Behavior: Behavior normal.        Thought Content: Thought content normal.        Judgment: Judgment normal.     Results for orders placed or performed in visit on 06/15/22  POCT urinalysis dipstick  Result Value Ref Range   Color, UA yellow    Clarity, UA cloudy    Glucose, UA Negative Negative   Bilirubin, UA negative    Ketones, UA positvie    Spec Grav, UA >=1.030 (A) 1.010 - 1.025   Blood, UA 3+    pH, UA 5.5 5.0 - 8.0   Protein, UA Positive (A) Negative   Urobilinogen, UA 1.0 0.2 or 1.0 E.U./dL   Nitrite, UA negative    Leukocytes, UA Negative Negative   Appearance Dark    Odor yes       Assessment & Plan:   Problem List Items Addressed This Visit       Cardiovascular and Mediastinum   Hot flashes    Chronic, stable.  She states the gabapentin 300 mg at bedtime is helping control her hot flashes.  Will continue this regimen.        Digestive   GERD (gastroesophageal reflux  disease)    Chronic, stable.  Will have her continue Protonix 40 mg daily, refill sent to the pharmacy.      Relevant Medications   pantoprazole (PROTONIX) 40 MG tablet     Endocrine   IFG (impaired fasting glucose)    Will check A1c today.      Relevant Orders   Comprehensive metabolic panel   Hemoglobin A1c     Other   Iron deficiency anemia    She stopped her iron tablet as she was having some trouble with diarrhea and abdominal pain.  Will check CBC and iron panel today.      Relevant Orders   CBC   Iron, TIBC and Ferritin Panel   Vitamin D deficiency    She has history of vitamin D deficiency, will check levels today and treat based on results.      Relevant Orders   VITAMIN D 25 Hydroxy (Vit-D Deficiency, Fractures)   Vitamin B 12 deficiency    Will check vitamin B12 today and treat based on results.      Relevant Orders   Vitamin B12   Obesity (BMI 30-39.9)    BMI 36.6.  She has been having  trouble losing weight over the past several months.  She has done a juicing diet in the past which usually works for her.  She has a treadmill at home and will be starting to walk on it regularly.  She is interested in medication to assist with weight loss.  Will start stepdown 2.5 mg injection weekly.  Discussed possible side effects.  Follow-up in 6 to 8 weeks.      Relevant Medications   tirzepatide (ZEPBOUND) 2.5 MG/0.5ML Pen   Other Visit Diagnoses     Routine general medical examination at a health care facility    -  Primary   Health maintenance reviewed and updated.  Check CMP, CBC, TSH.  Discussed exercise, nutrition.  Follow-up 1 year   Relevant Orders   TSH   Urinary urgency       UA showed 3+ blood, however has been having vaginal spotting.  Otherwise negative.  Encourage fluids.   Relevant Orders   POCT urinalysis dipstick (Completed)   Screening, lipid       Screen lipid panel today   Relevant Orders   Lipid panel   Encounter for hepatitis C screening  test for low risk patient       Screen hepatitis C today   Relevant Orders   Hepatitis C antibody        Follow up plan: Return in about 8 weeks (around 08/10/2022) for weight management.   LABORATORY TESTING:  - Pap smear: up to date  IMMUNIZATIONS:   - Tdap: Tetanus vaccination status reviewed: last tetanus booster within 10 years. - Influenza: Refused - Pneumovax: Not applicable - Prevnar: Not applicable - HPV: Not applicable - Zostavax vaccine: Refused  SCREENING: -Mammogram: Up to date  - Colonoscopy: Up to date  - Bone Density: Not applicable  -Hearing Test: Not applicable  -Spirometry: Not applicable   PATIENT COUNSELING:   Advised to take 1 mg of folate supplement per day if capable of pregnancy.   Sexuality: Discussed sexually transmitted diseases, partner selection, use of condoms, avoidance of unintended pregnancy  and contraceptive alternatives.   Advised to avoid cigarette smoking.  I discussed with the patient that most people either abstain from alcohol or drink within safe limits (<=14/week and <=4 drinks/occasion for males, <=7/weeks and <= 3 drinks/occasion for females) and that the risk for alcohol disorders and other health effects rises proportionally with the number of drinks per week and how often a drinker exceeds daily limits.  Discussed cessation/primary prevention of drug use and availability of treatment for abuse.   Diet: Encouraged to adjust caloric intake to maintain  or achieve ideal body weight, to reduce intake of dietary saturated fat and total fat, to limit sodium intake by avoiding high sodium foods and not adding table salt, and to maintain adequate dietary potassium and calcium preferably from fresh fruits, vegetables, and low-fat dairy products.    stressed the importance of regular exercise  Injury prevention: Discussed safety belts, safety helmets, smoke detector, smoking near bedding or upholstery.   Dental health: Discussed  importance of regular tooth brushing, flossing, and dental visits.    NEXT PREVENTATIVE PHYSICAL DUE IN 1 YEAR. Return in about 8 weeks (around 08/10/2022) for weight management.

## 2022-06-15 NOTE — Assessment & Plan Note (Signed)
She has history of vitamin D deficiency, will check levels today and treat based on results.

## 2022-06-15 NOTE — Assessment & Plan Note (Signed)
She stopped her iron tablet as she was having some trouble with diarrhea and abdominal pain.  Will check CBC and iron panel today.

## 2022-06-15 NOTE — Patient Instructions (Signed)
It was great to see you!  We are checking your labs today and will let you know the results via mychart/phone.   I will send in and see if your insurance covers the weight loss injection Zepbound. Start 1 injection once a week. Make sure you are drinking plenty of fluids.   Let's follow-up in 8 weeks, sooner if you have concerns.  If a referral was placed today, you will be contacted for an appointment. Please note that routine referrals can sometimes take up to 3-4 weeks to process. Please call our office if you haven't heard anything after this time frame.  Take care,  Vance Peper, NP

## 2022-06-15 NOTE — Assessment & Plan Note (Signed)
Chronic, stable.  She states the gabapentin 300 mg at bedtime is helping control her hot flashes.  Will continue this regimen.

## 2022-06-15 NOTE — Assessment & Plan Note (Signed)
Will check vitamin B12 today and treat based on results.

## 2022-06-15 NOTE — Assessment & Plan Note (Signed)
Chronic, stable.  Will have her continue Protonix 40 mg daily, refill sent to the pharmacy.

## 2022-06-15 NOTE — Assessment & Plan Note (Signed)
BMI 36.6.  She has been having trouble losing weight over the past several months.  She has done a juicing diet in the past which usually works for her.  She has a treadmill at home and will be starting to walk on it regularly.  She is interested in medication to assist with weight loss.  Will start stepdown 2.5 mg injection weekly.  Discussed possible side effects.  Follow-up in 6 to 8 weeks.

## 2022-06-16 LAB — LIPID PANEL
Cholesterol: 171 mg/dL (ref 0–200)
HDL: 41.4 mg/dL (ref >39.00)
LDL Cholesterol: 96 mg/dL (ref 0–99)
NonHDL: 129.7
Total CHOL/HDL Ratio: 4
Triglycerides: 167 mg/dL — ABNORMAL HIGH (ref 0.0–149.0)
VLDL: 33.4 mg/dL (ref 0.0–40.0)

## 2022-06-16 LAB — COMPREHENSIVE METABOLIC PANEL
ALT: 23 U/L (ref 0–35)
AST: 18 U/L (ref 0–37)
Albumin: 4.7 g/dL (ref 3.5–5.2)
Alkaline Phosphatase: 47 U/L (ref 39–117)
BUN: 11 mg/dL (ref 6–23)
CO2: 24 mEq/L (ref 19–32)
Calcium: 9.8 mg/dL (ref 8.4–10.5)
Chloride: 107 mEq/L (ref 96–112)
Creatinine, Ser: 0.72 mg/dL (ref 0.40–1.20)
GFR: 94.16 mL/min (ref >60.00)
Glucose, Bld: 124 mg/dL — ABNORMAL HIGH (ref 70–99)
Potassium: 3.9 mEq/L (ref 3.5–5.1)
Sodium: 142 mEq/L (ref 135–145)
Total Bilirubin: 0.6 mg/dL (ref 0.2–1.2)
Total Protein: 7 g/dL (ref 6.0–8.3)

## 2022-06-16 LAB — HEMOGLOBIN A1C: Hgb A1c MFr Bld: 5.8 % (ref 4.6–6.5)

## 2022-06-16 LAB — CBC
HCT: 46.8 % — ABNORMAL HIGH (ref 36.0–46.0)
Hemoglobin: 15.4 g/dL — ABNORMAL HIGH (ref 12.0–15.0)
MCHC: 32.8 g/dL (ref 30.0–36.0)
MCV: 89.1 fl (ref 78.0–100.0)
Platelets: 359 10*3/uL (ref 150.0–400.0)
RBC: 5.26 Mil/uL — ABNORMAL HIGH (ref 3.87–5.11)
RDW: 14.9 % (ref 11.5–15.5)
WBC: 5.2 10*3/uL (ref 4.0–10.5)

## 2022-06-16 LAB — IRON,TIBC AND FERRITIN PANEL
%SAT: 17 % (calc) (ref 16–45)
Ferritin: 101 ng/mL (ref 16–232)
Iron: 68 ug/dL (ref 45–160)
TIBC: 399 mcg/dL (calc) (ref 250–450)

## 2022-06-16 LAB — HEPATITIS C ANTIBODY: Hepatitis C Ab: NONREACTIVE

## 2022-06-17 LAB — VITAMIN B12: Vitamin B-12: 605 pg/mL (ref 211–911)

## 2022-06-17 LAB — TSH: TSH: 1.19 u[IU]/mL (ref 0.35–5.50)

## 2022-06-17 LAB — VITAMIN D 25 HYDROXY (VIT D DEFICIENCY, FRACTURES): VITD: 32.08 ng/mL (ref 30.00–100.00)

## 2022-06-25 ENCOUNTER — Other Ambulatory Visit: Payer: Self-pay | Admitting: Obstetrics and Gynecology

## 2022-07-11 ENCOUNTER — Ambulatory Visit: Payer: 59 | Admitting: Physician Assistant

## 2022-07-17 ENCOUNTER — Other Ambulatory Visit: Payer: Self-pay

## 2022-07-17 ENCOUNTER — Ambulatory Visit (HOSPITAL_COMMUNITY)
Admission: EM | Admit: 2022-07-17 | Discharge: 2022-07-17 | Disposition: A | Discharge status: Home / Self Care | Payer: 59 | Attending: Internal Medicine | Admitting: Internal Medicine

## 2022-07-17 ENCOUNTER — Encounter (HOSPITAL_COMMUNITY): Payer: Self-pay | Admitting: Emergency Medicine

## 2022-07-17 DIAGNOSIS — I10 Essential (primary) hypertension: Secondary | ICD-10-CM

## 2022-07-17 NOTE — ED Triage Notes (Signed)
Woke this morning with a headache .  Has checked blood pressure 4 times today .  Reports blood pressure is getting higher.  Continues to have head pain.  Patient does take blood pressure medicine, but does not describe taking medicine as prescribed.

## 2022-07-17 NOTE — Discharge Instructions (Signed)
Continue taking your medication Tylenol as needed for headaches Please continue to monitor your blood pressure.  If your blood pressure remains elevated or you have worsening headaches with elevated blood pressures please go to the emergency department to be evaluated further.

## 2022-07-17 NOTE — ED Provider Notes (Signed)
[place]    CSN: [id number] Arrival date & time: 07/17/22  1702      History   Chief Complaint Chief Complaint  Patient presents with   Hypertension    HPI Anita Holland is a 57 y.o. female with a history of hyper tension comes to the urgent care for headache and elevated blood pressure since this morning.  Patient woke up with mild to moderate headache.  The headache was pressure type headache.  Pain was of moderate severity.  She did not sleep well last night.  Her blood pressure this morning was 153/100.  She checked it again on 3 more occasions and the blood pressure reading was in the range of 150/100s.  She was advised to come to the urgent care after she spoke with her primary care physician.  Patient denies any numbness or tingling.  No double vision, facial deviation or difficulty finding her words.  She denies any prior episodes of elevated blood pressure with headaches.  No history of headaches.  Patient denies any alcohol use.  No weakness in the extremities.  No nausea or vomiting.  On presentation to the urgent care patient's blood pressure is 143/89 with a heart rate of 83.  Headache is improved to about 4 out of 10.  No other symptoms at this time. 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  Colposcopy showed inflammatory changes.  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  Recommend follow-up Pap smear 1 year   Peptic ulcer    Upper GI bleed 10/28/2017   Vaginal Pap smear, abnormal     Patient Active Problem List   Diagnosis Date Noted   Dysphagia 02/11/2022   Carpal tunnel syndrome of right wrist 02/11/2022   Primary hypertension 11/20/2021   IFG (impaired fasting glucose) 11/02/2021   Elevated LFTs  11/02/2021   Swelling of right foot 11/02/2021   Abnormal uterine bleeding 09/28/2021   Vitamin D deficiency 08/17/2021   Vitamin B 12 deficiency 08/17/2021   Hot flashes 08/17/2021   Obesity (BMI 30-39.9) 08/17/2021   H. 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 Authorizing Provider  Cyanocobalamin (B-12 PO) Take by mouth.    [provider]  gabapentin (NEURONTIN) 300 MG capsule Take 1 capsule (300 mg total) by mouth at bedtime. 08/17/21   McElwee, Lauren A, NP  hydrochlorothiazide (HYDRODIURIL) 25 MG tablet Take 1 tablet (25 mg total) by mouth daily. 06/08/22   McElwee, Scheryl Darter, NP  megestrol (MEGACE) 40 MG tablet Take 1 tablet (40 mg total) by mouth 2 (two) times daily. Can increase to two tablets twice a day in the event of heavy bleeding 12/05/21   Constant, Peggy, MD  pantoprazole (PROTONIX) 40 MG tablet Take 1 tablet (40 mg total) by mouth daily. 06/15/22   McElwee, Lauren A, NP  tirzepatide (ZEPBOUND) 2.5 MG/0.5ML Pen Inject 2.5 mg into the skin once a week. Patient not taking: Reported on 07/17/2022 06/15/22   Charyl Dancer,  NP    Family History Family History  Problem Relation Age of Onset   Stroke Sister 53   Hypertension Sister    Depression Sister    Heart attack Sister 42   Colon cancer Neg Hx    Colon polyps Neg Hx    Esophageal cancer Neg Hx    Rectal cancer Neg Hx    Stomach cancer Neg Hx     Social History Social History   Tobacco Use   Smoking status: Never   Smokeless tobacco: Never  Vaping Use   Vaping Use: Never used  Substance Use Topics   Alcohol use: No   Drug use: No     Allergies   Patient has no known allergies.   Review of Systems Review of Systems As per HPI  Physical Exam Triage Vital Signs ED Triage Vitals  Enc Vitals Group     BP 07/17/22 1846 (!) 143/89     Pulse Rate 07/17/22 1846 83     Resp 07/17/22 1846 18     Temp 07/17/22 1846 98 F (36.7 C)     Temp Source 07/17/22 1846 Oral     SpO2 07/17/22 1846 96 %     Weight --      Height --      Head Circumference --      Peak Flow --      Pain Score 07/17/22 1843 8     Pain Loc --      Pain Edu? --      Excl. in [place]? --    No data found.  Updated Vital Signs BP (!) 143/89 (BP Location: Right Arm) Comment (BP Location): large cuff  Pulse 83   Temp 98 F (36.7 C) (Oral)   Resp 18   SpO2 96%   Visual Acuity Right Eye Distance:   Left Eye Distance:   Bilateral Distance:    Right Eye Near:   Left Eye Near:    Bilateral Near:     Physical Exam Vitals and nursing note reviewed.  Constitutional:      General: She is not in acute distress.    Appearance: She is not ill-appearing.  Cardiovascular:     Rate and Rhythm: Normal rate and regular rhythm.     Pulses: Normal pulses.     Heart sounds: Normal heart sounds.  Pulmonary:     Effort: Pulmonary effort is normal.     Breath sounds: Normal breath sounds.  Abdominal:     General: Bowel sounds are normal.  Skin:    Capillary Refill: Capillary refill takes less than 2  seconds.  Neurological:     General: No focal deficit present.      Mental Status: She is alert and oriented to person, place, and time.     Cranial Nerves: No cranial nerve deficit.     Sensory: No sensory deficit.     Motor: No weakness.     Coordination: Coordination normal.  Psychiatric:        Mood and Affect: Mood normal.      UC Treatments / Results  Labs (all labs ordered are listed, but only abnormal results are displayed) Labs Reviewed - No data to display  EKG   Radiology No results found.  Procedures Procedures (including critical care time)  Medications Ordered in UC Medications - No data to display  Initial Impression / Assessment and Plan / UC Course  I have reviewed the triage vital signs and the nursing notes.  Pertinent labs & imaging results that were available during my care of the patient were reviewed by me and considered in my medical decision making (see chart for details).     1.  Uncontrolled hypertension: Blood pressure has improved Patient is advised to continue hydrochlorothiazide 25 mg orally daily If symptoms recur or worsens patient should go to the emergency room to be evaluated further No neurodeficits noted on the neuroexam Return precautions given. Final Clinical Impressions(s) / UC Diagnoses   Final diagnoses:  Uncontrolled hypertension     Discharge Instructions      Continue taking your medication Tylenol as needed for headaches Please continue to monitor your blood pressure.  If your blood pressure remains elevated or you have worsening headaches with elevated blood pressures please go to the emergency department to be evaluated further.   ED Prescriptions   None    PDMP not reviewed this encounter.   Chase Picket, MD 07/17/22 [phone]

## 2022-07-18 ENCOUNTER — Telehealth: Payer: Self-pay | Admitting: Nurse Practitioner

## 2022-07-18 MED ORDER — HYDROCHLOROTHIAZIDE 25 MG PO TABS: 25.0000 mg | ORAL_TABLET | Freq: Every day | ORAL | 1 refills | Status: DC

## 2022-07-18 MED ORDER — PANTOPRAZOLE SODIUM 40 MG PO TBEC: 40.0000 mg | DELAYED_RELEASE_TABLET | Freq: Every day | ORAL | 1 refills | Status: DC

## 2022-07-18 MED ORDER — GABAPENTIN 300 MG PO CAPS: 300.0000 mg | ORAL_CAPSULE | Freq: Every day | ORAL | 1 refills | Status: DC

## 2022-07-18 NOTE — Telephone Encounter (Signed)
Caller Name: Levana Call back phone #: [phone]   MEDICATION(S):  hydrochlorothiazide (HYDRODIURIL) 25 MG tablet  pt states needs refill that she only got 30 day supply in December (went to ER 07/17/2022 for HTN)  pantoprazole (PROTONIX) 40 MG tablet  pharmacy told her they don't have refills  gabapentin (NEURONTIN) 300 MG capsule  pharmacy told her no refills  Preferred Pharmacy:  [id number] [place] [id number]: [phone] Fax: [phone]

## 2022-07-21 ENCOUNTER — Telehealth: Payer: Self-pay | Admitting: Nurse Practitioner

## 2022-07-21 MED ORDER — LOSARTAN POTASSIUM 25 MG PO TABS: 12.5000 mg | ORAL_TABLET | Freq: Every day | ORAL | 1 refills | Status: DC

## 2022-07-21 MED ORDER — AMLODIPINE BESYLATE 2.5 MG PO TABS: 2.5000 mg | ORAL_TABLET | Freq: Every day | ORAL | 0 refills | Status: DC

## 2022-07-21 NOTE — Telephone Encounter (Signed)
Called and spoke with patient she said that she  been having headache off and on . She said that she has been checking  b/p twice  a day to three a day  ranging 124/87 to 140/95. She said that she has been taking he HCTZ '25mg'$  , she said she wasn't having any other symptom other then headache,the last time she check her b/p was at 1245 it 140/95

## 2022-07-21 NOTE — Addendum Note (Signed)
Addended by: Vance Peper A on: 07/21/2022 02:59 PM   Modules accepted: Orders

## 2022-07-21 NOTE — Telephone Encounter (Signed)
[place]  

## 2022-07-21 NOTE — Telephone Encounter (Signed)
Spoke with patient she said that she isn't not able to take amlodipine because it cause her feet to swell. She wants know if send some else in for her.

## 2022-07-21 NOTE — Telephone Encounter (Signed)
Called informed pt that we have sent in different medication she is take 1/2 once day and follow up with 2 weeks. Made her 2 week appt to follow up regarding her b/p.

## 2022-07-21 NOTE — Telephone Encounter (Signed)
Pt is having headaches she feels one reason is because of her bp. It's Friday and she is wondering what she should do for the weekend. Please advise pt at [phone].

## 2022-07-22 ENCOUNTER — Other Ambulatory Visit: Payer: Self-pay

## 2022-07-22 ENCOUNTER — Emergency Department (HOSPITAL_BASED_OUTPATIENT_CLINIC_OR_DEPARTMENT_OTHER)
Admission: EM | Admit: 2022-07-22 | Discharge: 2022-07-22 | Disposition: A | Discharge status: Home / Self Care | Payer: 59 | Attending: Emergency Medicine | Admitting: Emergency Medicine

## 2022-07-22 DIAGNOSIS — Z79899 Other long term (current) drug therapy: Secondary | ICD-10-CM | POA: Insufficient documentation

## 2022-07-22 DIAGNOSIS — D72829 Elevated white blood cell count, unspecified: Secondary | ICD-10-CM | POA: Diagnosis not present

## 2022-07-22 DIAGNOSIS — I10 Essential (primary) hypertension: Secondary | ICD-10-CM | POA: Insufficient documentation

## 2022-07-22 DIAGNOSIS — R519 Headache, unspecified: Secondary | ICD-10-CM

## 2022-07-22 LAB — CBC WITH DIFFERENTIAL/PLATELET
Abs Immature Granulocytes: 0.01 10*3/uL (ref 0.00–0.07)
Basophils Absolute: 0 10*3/uL (ref 0.0–0.1)
Basophils Relative: 1 %
Eosinophils Absolute: 0.2 10*3/uL (ref 0.0–0.5)
Eosinophils Relative: 4 %
HCT: 45.8 % (ref 36.0–46.0)
Hemoglobin: 14.9 g/dL (ref 12.0–15.0)
Immature Granulocytes: 0 %
Lymphocytes Relative: 33 %
Lymphs Abs: 1.6 10*3/uL (ref 0.7–4.0)
MCH: 28.8 pg (ref 26.0–34.0)
MCHC: 32.5 g/dL (ref 30.0–36.0)
MCV: 88.6 fL (ref 80.0–100.0)
Monocytes Absolute: 0.5 10*3/uL (ref 0.1–1.0)
Monocytes Relative: 10 %
Neutro Abs: 2.6 10*3/uL (ref 1.7–7.7)
Neutrophils Relative %: 52 %
Platelets: 315 10*3/uL (ref 150–400)
RBC: 5.17 MIL/uL — ABNORMAL HIGH (ref 3.87–5.11)
RDW: 13.6 % (ref 11.5–15.5)
WBC: 5 10*3/uL (ref 4.0–10.5)
nRBC: 0 % (ref 0.0–0.2)

## 2022-07-22 LAB — COMPREHENSIVE METABOLIC PANEL
ALT: 21 U/L (ref 0–44)
AST: 14 U/L — ABNORMAL LOW (ref 15–41)
Albumin: 4.5 g/dL (ref 3.5–5.0)
Alkaline Phosphatase: 40 U/L (ref 38–126)
Anion gap: 10 (ref 5–15)
BUN: 12 mg/dL (ref 6–20)
CO2: 26 mmol/L (ref 22–32)
Calcium: 10.2 mg/dL (ref 8.9–10.3)
Chloride: 104 mmol/L (ref 98–111)
Creatinine, Ser: 0.64 mg/dL (ref 0.44–1.00)
GFR, Estimated: >60 mL/min (ref >60)
Glucose, Bld: 101 mg/dL — ABNORMAL HIGH (ref 70–99)
Potassium: 3.3 mmol/L — ABNORMAL LOW (ref 3.5–5.1)
Sodium: 140 mmol/L (ref 135–145)
Total Bilirubin: 0.9 mg/dL (ref 0.3–1.2)
Total Protein: 7.3 g/dL (ref 6.5–8.1)

## 2022-07-22 LAB — URINALYSIS, ROUTINE W REFLEX MICROSCOPIC
Bilirubin Urine: NEGATIVE
Glucose, UA: NEGATIVE mg/dL
Hgb urine dipstick: NEGATIVE
Ketones, ur: NEGATIVE mg/dL
Leukocytes,Ua: NEGATIVE
Nitrite: NEGATIVE
Protein, ur: NEGATIVE mg/dL
Specific Gravity, Urine: 1.009 (ref 1.005–1.030)
pH: 6.5 (ref 5.0–8.0)

## 2022-07-22 MED ORDER — METOCLOPRAMIDE HCL 5 MG/ML IJ SOLN
10.0000 mg | Freq: Once | INTRAMUSCULAR | Status: AC
  Administered 2022-07-22: 10 mg via INTRAVENOUS
  Filled 2022-07-22: qty 2

## 2022-07-22 MED ORDER — DEXAMETHASONE SODIUM PHOSPHATE 10 MG/ML IJ SOLN
10.0000 mg | Freq: Once | INTRAMUSCULAR | Status: AC
  Administered 2022-07-22: 10 mg via INTRAVENOUS
  Filled 2022-07-22: qty 1

## 2022-07-22 MED ORDER — POTASSIUM CHLORIDE CRYS ER 20 MEQ PO TBCR
20.0000 meq | EXTENDED_RELEASE_TABLET | Freq: Once | ORAL | Status: AC
  Administered 2022-07-22: 20 meq via ORAL
  Filled 2022-07-22: qty 1

## 2022-07-22 MED ORDER — ACETAMINOPHEN 500 MG PO TABS
1000.0000 mg | ORAL_TABLET | Freq: Once | ORAL | Status: AC
  Administered 2022-07-22: 1000 mg via ORAL
  Filled 2022-07-22: qty 2

## 2022-07-22 MED ORDER — LACTATED RINGERS IV BOLUS
500.0000 mL | Freq: Once | INTRAVENOUS | Status: AC
  Administered 2022-07-22: 500 mL via INTRAVENOUS

## 2022-07-22 NOTE — Discharge Instructions (Signed)
You were seen in the ER for evaluation of your headache. Your labs were reassuring, other than your potassium being a little lower than normal. We have replenished this here, however I would let your PCP know so that they can further monitor this. I am glad you are feeling better after the medications. I would like for you to try Tylenol '1000mg'$  every 6 hours as needed for pain. You can also try Excedrin Tension Headache as this doesn't have aspirin or ibuprofen to hurt your stomach, but does have some caffeine that can help with headaches. Additionally, I have included some information for a neurologist. Please call to schedule an appointment. I would also like for you to follow up with your PCP for your blood pressure. If you have any concerns, new or worsening symptoms, please return to the ER for re-evaluaiton.   Contact a doctor if: Medicine does not help your symptoms. You have a headache that feels different than the other headaches. You feel like you may vomit (nauseous) or you vomit. You have a fever. Get help right away if: Your headache: Gets very bad quickly. Gets worse after a lot of physical activity. You have any of these symptoms: You continue to vomit. A stiff neck. Trouble seeing. Your eye or ear hurts. Trouble speaking. Weak muscles or you lose muscle control. You lose your balance or have trouble walking. You feel like you will pass out (faint) or you pass out. You are mixed up (confused). You have a seizure. These symptoms may be an emergency. Get help right away. Call your local emergency services (911 in the [place] Do not wait to see if the symptoms will go away. Do not drive yourself to the hospital.

## 2022-07-22 NOTE — ED Provider Notes (Signed)
[facility] Provider Note   CSN: [id number] Arrival date & time: 07/22/22  0932     History Chief Complaint  Patient presents with   Headache   Hypertension    Anita Holland is a 57 y.o. female with h/o HTN and peptic ulcers presents the emergency room today for evaluation of intermittent headache for the past 5 days.  The headache is more diffusely at the top of her head.  She reports that her blood pressure has been elevated more than usual as well and it has been 159/100.  She reports has been having some phonophobia but no blurry vision or photophobia.  She takes some Tylenol and then goes to sleep so she is not sure if the Tylenol is working or not as she is sleeping but does wake up and less pain.  She denies any trouble walking or any trouble talking.  She denies any chest pain or shortness of breath.  Denies any fever or flulike symptoms.  Denies any abdominal pain, vomiting, or diarrhea.  Denies any dysuria or hematuria.  She reports that she did have some mild nausea this morning however went away without any interventions.  She is currently on hydrochlorothiazide as she did not have good reactions to amlodipine.  She also takes pantoprazole and gabapentin.  She denies any tobacco, EtOH, illicit drug use.   Headache Associated symptoms: nausea   Associated symptoms: no abdominal pain, no congestion, no cough, no diarrhea, no dizziness, no fever, no neck pain and no vomiting   Hypertension Associated symptoms include headaches. Pertinent negatives include no chest pain, no abdominal pain and no shortness of breath.       Home Medications Prior to Admission medications   Medication Sig Start Date End Date Taking? Authorizing Provider  Cyanocobalamin (B-12 PO) Take by mouth.    [provider]  gabapentin (NEURONTIN) 300 MG capsule Take 1 capsule (300 mg total) by mouth at bedtime. 07/18/22   McElwee, Lauren A, NP   hydrochlorothiazide (HYDRODIURIL) 25 MG tablet Take 1 tablet (25 mg total) by mouth daily. 07/18/22   McElwee, Lauren A, NP  losartan (COZAAR) 25 MG tablet Take 0.5 tablets (12.5 mg total) by mouth daily. 07/21/22   McElwee, Scheryl Darter, NP  megestrol (MEGACE) 40 MG tablet Take 1 tablet (40 mg total) by mouth 2 (two) times daily. Can increase to two tablets twice a day in the event of heavy bleeding 12/05/21   Constant, Peggy, MD  pantoprazole (PROTONIX) 40 MG tablet Take 1 tablet (40 mg total) by mouth daily. 07/18/22   McElwee, Lauren A, NP  tirzepatide (ZEPBOUND) 2.5 MG/0.5ML Pen Inject 2.5 mg into the skin once a week. Patient not taking: Reported on 07/17/2022 06/15/22   Charyl Dancer, NP      Allergies    Patient has no known allergies.    Review of Systems   Review of Systems  Constitutional:  Negative for chills and fever.  HENT:  Negative for congestion and rhinorrhea.        Reports occasional mild phonophobia  Respiratory:  Negative for cough and shortness of breath.   Cardiovascular:  Negative for chest pain.  Gastrointestinal:  Positive for nausea. Negative for abdominal pain, diarrhea and vomiting.  Genitourinary:  Negative for dysuria and hematuria.  Musculoskeletal:  Negative for neck pain.  Neurological:  Positive for headaches. Negative for dizziness, syncope and light-headedness.    Physical Exam Updated Vital Signs BP (!) 133/92 (  BP Location: Right Arm)   Pulse 85   Temp 98.1 F (36.7 C) (Oral)   Resp 17   Ht '5\' 3"'$  (1.6 m)   Wt 90.3 kg   SpO2 100%   BMI 35.25 kg/m  Physical Exam Vitals and nursing note reviewed.  Constitutional:      General: She is not in acute distress.    Appearance: Normal appearance. She is not ill-appearing or toxic-appearing.     Comments: On phone in no acute distress  HENT:     Head: Normocephalic and atraumatic.     Comments: No tenderness to the temple or scalp region. No overlying skin changes noted.  Eyes:     General: No  scleral icterus.    Extraocular Movements: Extraocular movements intact.     Pupils: Pupils are equal, round, and reactive to light.  Neck:     Comments: Full range of motion without pain.  No neck rigidity.  No meningeal signs. Cardiovascular:     Rate and Rhythm: Normal rate and regular rhythm.  Pulmonary:     Effort: Pulmonary effort is normal. No respiratory distress.     Breath sounds: Normal breath sounds.  Abdominal:     General: Abdomen is flat. Bowel sounds are normal.     Palpations: Abdomen is soft.     Tenderness: There is no abdominal tenderness. There is no guarding.  Musculoskeletal:        General: No deformity.     Cervical back: Normal range of motion. No rigidity.  Skin:    General: Skin is warm and dry.  Neurological:     General: No focal deficit present.     Mental Status: She is alert. Mental status is at baseline.     GCS: GCS eye subscore is 4. GCS verbal subscore is 5. GCS motor subscore is 6.     Cranial Nerves: No cranial nerve deficit, dysarthria or facial asymmetry.     Sensory: No sensory deficit.     Motor: No weakness or pronator drift.     Coordination: Coordination normal.     Comments: GCS 15.  Patient alert.  Cranial nerves II through XII intact.  No facial asymmetry noted.  She is answering questions appropriately with appropriate speech.  Sensation intact throughout.  Strength is 5-5 in patient's upper and lower extremities.  No pronator drift.  Normal finger-nose-finger with bilateral arms.     ED Results / Procedures / Treatments   Labs (all labs ordered are listed, but only abnormal results are displayed) Labs Reviewed  CBC WITH DIFFERENTIAL/PLATELET - Abnormal; Notable for the following components:      Result Value   RBC 5.17 (*)    All other components within normal limits  COMPREHENSIVE METABOLIC PANEL - Abnormal; Notable for the following components:   Potassium 3.3 (*)    Glucose, Bld 101 (*)    AST 14 (*)    All other  components within normal limits  URINALYSIS, ROUTINE W REFLEX MICROSCOPIC    EKG None  Radiology No results found.  Procedures Procedures   Medications Ordered in ED Medications  dexamethasone (DECADRON) injection 10 mg (10 mg Intravenous Given 07/22/22 1025)  metoCLOPramide (REGLAN) injection 10 mg (10 mg Intravenous Given 07/22/22 1027)  acetaminophen (TYLENOL) tablet 1,000 mg (1,000 mg Oral Given 07/22/22 1035)  lactated ringers bolus 500 mL (0 mLs Intravenous Stopped 07/22/22 1112)    ED Course/ Medical Decision Making/ A&P  Medical Decision Making Amount and/or Complexity of Data Reviewed Labs: ordered.  Risk OTC drugs. Prescription drug management.   57 year old female presents emergency room today for evaluation of headache with elevated blood pressure.  Differential diagnosis includes was not limited to Intracranial hemorrhage, meningitis, CVA, intracranial tumor, venous sinus thrombosis, migraine, cluster headache, hypertension, drug related, pseudotumor cerebri, AVM, head injury, tension headache, sinusitis, dental abscess, otitis media, TMJ, depression, temporal arteritis, glaucoma, trigeminal neuralgia. Vital signs show mildly elevated BP at 133/92, otherwise unremarkable. Physical exam as noted above.   Given the patient's reassuring vitals and physical exam, I discussed with her that this is likely a headache.  We discussed doing some lab work for further reassurance, patient would like to proceed with lab work and migraine cocktail.  We discussed doing a CT of her head because patient was concerned.  I told her that this may or may not show something but given her reassuring vital signs, physical exam and lab work it is less likely needed, especially since she has had resolution of her pain after the migraine cocktail.  I independently reviewed and interpreted the patient's labs.  CBC with a leukocytosis or anemia.  Normal platelets.   Urinalysis unremarkable as well, no signs of dehydration.  CMP shows mildly decreased potassium 3.3.  Glucose at 101, mildly decreased AST at 14 otherwise no electrolyte or LFT abnormality.  Potassium replenished with 20 mill equivalents.  I doubt any temporal arteritis given the patient's nontender scalp, no visual changes, or jaw claudication.  I doubt any meningitis the patient does not have a white count, fever, or any neck stiffness.  I doubt any CVA or TIA as patient does not have any neurodeficits.  No tenderness to the face, not clearly consistent with trigeminal neuralgia, dental abscess, otitis media, or TMJ.  Likely headache.  On re-evaluation, the patient reports that her headache is gone and that she is feeling much better.  I reviewed her lab results with her at bedside.  Discussed her mildly low potassium that we replenished today.  Recommended following with her primary care doctor for reevaluation of this and further monitoring.  I did give her a referral for a neurologist as well given that she keeps having these repeated headaches however reassuring that they are relieved with pain medication.  I offered to give her CT scan however patient declined.  I think this is reasonable.  Will follow-up with her primary care doctor for her slightly elevated blood pressure however I do not think adding additional medication at this time is reasonable given that she does have a headache and is having only minimal elevated blood pressures.  We discussed return precautions and red flag symptoms.  Discussed pain management at home.  The patient verbalizes understanding and agrees with plan.  Patient is stable being discharged home in good condition.    Final Clinical Impression(s) / ED Diagnoses Final diagnoses:  None    Rx / DC Orders ED Discharge Orders     None         Sherrell Puller, PA-C 07/22/22 [place], Annie Main, MD 07/22/22 1645

## 2022-07-22 NOTE — ED Triage Notes (Addendum)
Patient arrives ambulatory to department with complaints of persistent headache and irregular blood pressures x1 week (diastolic in the 322'V). Patient has been complaint with her bp meds, but still having headaches.

## 2022-07-22 NOTE — ED Notes (Signed)
Discharge paperwork given and verbally understood. 

## 2022-08-06 NOTE — Progress Notes (Unsigned)
   Established Patient Office Visit  Subjective   Patient ID: Anita Holland, female    DOB: March 12, 1966  Age: 57 y.o. MRN: [id number]  No chief complaint on file.   HPI  Anita Holland is here to follow-up on hypertension and headaches. She is currently taking losartan '25mg'$  and hctz '25mg'$  daily.   {History (Optional):23778}  ROS    Objective:     There were no vitals taken for this visit. {Vitals History (Optional):23777}  Physical Exam   No results found for any visits on 08/07/22.  {Labs (Optional):23779}  The 10-year ASCVD risk score (Arnett DK, et al., 2019) is: 8.2%    Assessment & Plan:   Problem List Items Addressed This Visit   None   No follow-ups on file.    Charyl Dancer, NP

## 2022-08-07 ENCOUNTER — Encounter: Payer: Self-pay | Admitting: Nurse Practitioner

## 2022-08-07 ENCOUNTER — Ambulatory Visit (INDEPENDENT_AMBULATORY_CARE_PROVIDER_SITE_OTHER): Payer: Commercial Managed Care - HMO | Admitting: Nurse Practitioner

## 2022-08-07 VITALS — BP 132/90 | HR 84 | Temp 98.1°F | Wt 203.2 lb

## 2022-08-07 DIAGNOSIS — E669 Obesity, unspecified: Secondary | ICD-10-CM | POA: Diagnosis not present

## 2022-08-07 DIAGNOSIS — I10 Essential (primary) hypertension: Secondary | ICD-10-CM | POA: Diagnosis not present

## 2022-08-07 DIAGNOSIS — R519 Headache, unspecified: Secondary | ICD-10-CM | POA: Diagnosis not present

## 2022-08-07 MED ORDER — LOSARTAN POTASSIUM-HCTZ 50-12.5 MG PO TABS: 1.0000 | ORAL_TABLET | Freq: Every day | ORAL | 1 refills | Status: DC

## 2022-08-07 NOTE — Patient Instructions (Addendum)
It was great to see you!  We are checking your labs today and will let you know the results via mychart/phone.   I have ordered a CT of your head, they will call to schedule.   You can start a magnesium supplement to help with headaches.   I am combining your 2 blood pressure pills together, so stop the other 2.   Let's follow-up in 4 weeks, sooner if you have concerns.  If a referral was placed today, you will be contacted for an appointment. Please note that routine referrals can sometimes take up to 3-4 weeks to process. Please call our office if you haven't heard anything after this time frame.  Take care,  Vance Peper, NP

## 2022-08-07 NOTE — Assessment & Plan Note (Signed)
Chronic, improving.  Blood pressure still slightly elevated at 132/90.  Will increase her losartan and combine it into a single pill with losartan-HCTZ 50-12.5 mg daily.  Potassium was slightly low in the ER at 3.3 and she received a supplement.  Will check her BMP today.  Follow-up in 3 to 4 weeks.

## 2022-08-07 NOTE — Assessment & Plan Note (Signed)
BMI 36.  Her insurance does not cover weight loss medication.  She states that she has been trying to do intermittent fasting and juicing and is still having hard time losing weight.  Will place referral to [facility].

## 2022-08-07 NOTE — Assessment & Plan Note (Signed)
She has still been experiencing headaches almost daily.  No red flags on exam today.  She went to the emergency room and did not have any imaging done.  With ongoing headaches, will check CT of her head and also check ESR and CRP.  She can continue taking Tylenol as needed for pain.  She can try a magnesium supplement daily to see if this helps with her headaches as well.  Follow-up in 4 weeks.

## 2022-08-08 LAB — C-REACTIVE PROTEIN: CRP: <1 mg/dL (ref 0.5–20.0)

## 2022-08-08 LAB — BASIC METABOLIC PANEL
BUN: 11 mg/dL (ref 6–23)
CO2: 27 mEq/L (ref 19–32)
Calcium: 10.1 mg/dL (ref 8.4–10.5)
Chloride: 101 mEq/L (ref 96–112)
Creatinine, Ser: 0.68 mg/dL (ref 0.40–1.20)
GFR: 97.98 mL/min (ref >60.00)
Glucose, Bld: 91 mg/dL (ref 70–99)
Potassium: 4 mEq/L (ref 3.5–5.1)
Sodium: 138 mEq/L (ref 135–145)

## 2022-08-08 LAB — SEDIMENTATION RATE: Sed Rate: 19 mm/hr (ref 0–30)

## 2022-08-15 ENCOUNTER — Encounter: Payer: Self-pay | Admitting: Internal Medicine

## 2022-08-24 ENCOUNTER — Telehealth: Payer: Self-pay | Admitting: Nurse Practitioner

## 2022-08-24 NOTE — Telephone Encounter (Signed)
Patient was getting confused with Rx names. Per Lauren's last note she is to take the Losartan Potassium-HCTZ. 50-12.'5mg'$ .

## 2022-08-24 NOTE — Telephone Encounter (Signed)
Pt received 2 of the same med with different dosage. She wants to know which should she take? Please call

## 2022-09-13 ENCOUNTER — Ambulatory Visit
Admission: RE | Admit: 2022-09-13 | Discharge: 2022-09-13 | Disposition: A | Discharge status: Home / Self Care | Payer: Commercial Managed Care - HMO | Source: Ambulatory Visit | Attending: Nurse Practitioner | Admitting: Nurse Practitioner

## 2022-09-13 DIAGNOSIS — R519 Headache, unspecified: Secondary | ICD-10-CM

## 2022-09-18 ENCOUNTER — Other Ambulatory Visit: Payer: Self-pay | Admitting: Nurse Practitioner

## 2022-09-27 ENCOUNTER — Other Ambulatory Visit: Payer: Self-pay | Admitting: Nurse Practitioner

## 2022-10-14 ENCOUNTER — Other Ambulatory Visit: Payer: Self-pay | Admitting: Nurse Practitioner

## 2022-10-24 ENCOUNTER — Encounter: Payer: Self-pay | Admitting: Gastroenterology

## 2022-10-24 ENCOUNTER — Telehealth: Payer: Self-pay | Admitting: Nurse Practitioner

## 2022-10-24 NOTE — Telephone Encounter (Signed)
error 

## 2022-11-07 ENCOUNTER — Other Ambulatory Visit: Payer: Self-pay | Admitting: Nurse Practitioner

## 2022-11-07 DIAGNOSIS — Z1231 Encounter for screening mammogram for malignant neoplasm of breast: Secondary | ICD-10-CM

## 2022-11-13 ENCOUNTER — Ambulatory Visit
Admission: RE | Admit: 2022-11-13 | Discharge: 2022-11-13 | Disposition: A | Discharge status: Home / Self Care | Payer: Commercial Managed Care - HMO | Source: Ambulatory Visit

## 2022-11-13 DIAGNOSIS — Z1231 Encounter for screening mammogram for malignant neoplasm of breast: Secondary | ICD-10-CM

## 2022-11-16 ENCOUNTER — Other Ambulatory Visit: Payer: Self-pay | Admitting: Nurse Practitioner

## 2022-11-16 DIAGNOSIS — R928 Other abnormal and inconclusive findings on diagnostic imaging of breast: Secondary | ICD-10-CM

## 2022-11-29 ENCOUNTER — Ambulatory Visit: Admission: RE | Admit: 2022-11-29 | Payer: Commercial Managed Care - HMO | Source: Ambulatory Visit

## 2022-11-29 ENCOUNTER — Ambulatory Visit
Admission: RE | Admit: 2022-11-29 | Discharge: 2022-11-29 | Disposition: A | Discharge status: Home / Self Care | Payer: Commercial Managed Care - HMO | Source: Ambulatory Visit | Attending: Nurse Practitioner | Admitting: Nurse Practitioner

## 2022-11-29 DIAGNOSIS — R928 Other abnormal and inconclusive findings on diagnostic imaging of breast: Secondary | ICD-10-CM

## 2022-12-26 ENCOUNTER — Telehealth: Payer: Self-pay | Admitting: Nurse Practitioner

## 2022-12-26 NOTE — Telephone Encounter (Signed)
Referral questions. She is confused about what it should be for.

## 2022-12-27 NOTE — Telephone Encounter (Signed)
Pt reports receiving a phone call to schedule colonoscopy and "something about my throat." Pt spoke with Lauren previously about issues swallowing and was to follow up if worsened. Pt never contacted Lauren again regarding her throat or swallowing so the reason stated during the call for the colonoscopy confused her.  I see a referral placed in 01/2022 to [facility] for dysphagia, however that was cancelled by the pt.  Explained to pt that she is due for colonoscopy and she should schedule appt to have that completed.

## 2023-01-01 ENCOUNTER — Encounter: Payer: Self-pay | Admitting: Internal Medicine

## 2023-01-02 ENCOUNTER — Ambulatory Visit: Payer: Commercial Managed Care - HMO | Admitting: Gastroenterology

## 2023-01-25 ENCOUNTER — Encounter (INDEPENDENT_AMBULATORY_CARE_PROVIDER_SITE_OTHER): Payer: Self-pay

## 2023-02-07 ENCOUNTER — Encounter: Payer: Self-pay | Admitting: Internal Medicine

## 2023-02-07 ENCOUNTER — Ambulatory Visit (AMBULATORY_SURGERY_CENTER): Payer: Commercial Managed Care - HMO

## 2023-02-07 VITALS — Ht 63.0 in | Wt 189.0 lb

## 2023-02-07 DIAGNOSIS — Z86010 Personal history of colonic polyps: Secondary | ICD-10-CM

## 2023-02-07 MED ORDER — NA SULFATE-K SULFATE-MG SULF 17.5-3.13-1.6 GM/177ML PO SOLN: 1.0000 | Freq: Once | ORAL | 0 refills | Status: AC

## 2023-02-07 NOTE — Progress Notes (Signed)
No egg or soy allergy known to patient  No issues known to pt with past sedation with any surgeries or procedures Patient denies ever being told they had issues or difficulty with intubation  No FH of Malignant Hyperthermia Pt is not on diet pills Pt is not on  home 02  Pt is not on blood thinners  Pt denies issues with constipation takes beets and greens No A fib or A flutter Have any cardiac testing pending--no Pt can ambulate independently Pt denies use of chewing tobacco Discussed diabetic I weight loss medication holds Discussed NSAID holds Checked BMI Pt instructed to use [employer] or GoodRx for a price reduction on prep  Patient's chart reviewed by Cathlyn Parsons CNRA prior to previsit and patient appropriate for the [facility].  Pre visit completed and red dot placed by patient's name on their procedure day (on provider's schedule).

## 2023-03-01 ENCOUNTER — Encounter: Payer: Self-pay | Admitting: Internal Medicine

## 2023-03-01 ENCOUNTER — Ambulatory Visit (AMBULATORY_SURGERY_CENTER): Payer: Commercial Managed Care - HMO | Admitting: Internal Medicine

## 2023-03-01 VITALS — BP 134/88 | HR 78 | Temp 98.6°F | Resp 16 | Ht 63.0 in | Wt 189.0 lb

## 2023-03-01 DIAGNOSIS — D123 Benign neoplasm of transverse colon: Secondary | ICD-10-CM

## 2023-03-01 DIAGNOSIS — Z09 Encounter for follow-up examination after completed treatment for conditions other than malignant neoplasm: Secondary | ICD-10-CM | POA: Diagnosis present

## 2023-03-01 DIAGNOSIS — Z86010 Personal history of colonic polyps: Secondary | ICD-10-CM

## 2023-03-01 DIAGNOSIS — D120 Benign neoplasm of cecum: Secondary | ICD-10-CM | POA: Diagnosis not present

## 2023-03-01 DIAGNOSIS — D124 Benign neoplasm of descending colon: Secondary | ICD-10-CM | POA: Diagnosis not present

## 2023-03-01 MED ORDER — SODIUM CHLORIDE 0.9 % IV SOLN: 500.0000 mL | INTRAVENOUS | Status: DC

## 2023-03-01 NOTE — Progress Notes (Signed)
Uneventful anesthetic. Report to pacu rn. Vss. Care resumed by rn. 

## 2023-03-01 NOTE — Progress Notes (Signed)
Called to room to assist during endoscopic procedure.  Patient ID and intended procedure confirmed with present staff. Received instructions for my participation in the procedure from the performing physician.  

## 2023-03-01 NOTE — Op Note (Signed)
[facility] Patient Name: Anita Holland Procedure Date: 03/01/2023 3:36 PM MRN: [id number] Endoscopist: Beverley Fiedler , MD, [id number] Age: 57 Referring MD:  Date of Birth: 1966/04/01 Gender: Female Account #: [id number] Procedure:                Colonoscopy Indications:              High risk colon cancer surveillance: Personal                            history of non-advanced adenoma, Last colonoscopy:                            January 2019 Medicines:                Monitored Anesthesia Care Procedure:                Pre-Anesthesia Assessment:                           - Prior to the procedure, a History and Physical                            was performed, and patient medications and                            allergies were reviewed. The patient's tolerance of                            previous anesthesia was also reviewed. The risks                            and benefits of the procedure and the sedation                            options and risks were discussed with the patient.                            All questions were answered, and informed consent                            was obtained. Prior Anticoagulants: The patient has                            taken no anticoagulant or antiplatelet agents. ASA                            Grade Assessment: II - A patient with mild systemic                            disease. After reviewing the risks and benefits,                            the patient was deemed in satisfactory condition to  undergo the procedure.                           After obtaining informed consent, the colonoscope                            was passed under direct vision. Throughout the                            procedure, the patient's blood pressure, pulse, and                            oxygen saturations were monitored continuously. The                            CF [id number] #[id number] was introduced through the anus                             and advanced to the cecum, identified by                            appendiceal orifice and ileocecal valve. The                            colonoscopy was performed without difficulty. The                            patient tolerated the procedure well. The quality                            of the bowel preparation was good. The ileocecal                            valve, appendiceal orifice, and rectum were                            photographed. Scope In: 3:45:00 PM Scope Out: 3:58:27 PM Scope Withdrawal Time: 0 hours 11 minutes 19 seconds  Total Procedure Duration: 0 hours 13 minutes 27 seconds  Findings:                 The digital rectal exam was normal.                           Two sessile polyps were found in the cecum. The                            polyps were 2 to 3 mm in size. These polyps were                            removed with a cold biopsy forceps. Resection and                            retrieval were complete.  A 5 mm polyp was found in the transverse colon. The                            polyp was sessile. The polyp was removed with a                            cold snare. Resection and retrieval were complete.                           Two sessile and semi-pedunculated polyps were found                            in the descending colon. The polyps were 3 to 7 mm                            in size. These polyps were removed with a cold                            snare. Resection and retrieval were complete.                           Multiple medium-mouthed and small-mouthed                            diverticula were found in the sigmoid colon and                            descending colon.                           Internal hemorrhoids were found during                            retroflexion. The hemorrhoids were small. Complications:            No immediate complications. Estimated Blood Loss:      Estimated blood loss was minimal. Impression:               - Two 2 to 3 mm polyps in the cecum, removed with a                            cold biopsy forceps. Resected and retrieved.                           - One 5 mm polyp in the transverse colon, removed                            with a cold snare. Resected and retrieved.                           - Two 3 to 7 mm polyps in the descending colon,  removed with a cold snare. Resected and retrieved.                           - Moderate diverticulosis in the sigmoid colon and                            in the descending colon.                           - Small internal hemorrhoids. Recommendation:           - Patient has a contact number available for                            emergencies. The signs and symptoms of potential                            delayed complications were discussed with the                            patient. Return to normal activities tomorrow.                            Written discharge instructions were provided to the                            patient.                           - Resume previous diet.                           - Continue present medications.                           - Await pathology results.                           - Repeat colonoscopy is recommended for                            surveillance. The colonoscopy date will be                            determined after pathology results from today's                            exam become available for review. Beverley Fiedler, MD 03/01/2023 4:01:21 PM This report has been signed electronically.

## 2023-03-01 NOTE — Patient Instructions (Signed)
Resume your current medications as ordered. Read the handouts given to you by your recovery room nurse.  YOU HAD AN ENDOSCOPIC PROCEDURE TODAY AT THE [facility]:   Refer to the procedure report that was given to you for any specific questions about what was found during the examination.  If the procedure report does not answer your questions, please call your gastroenterologist to clarify.  If you requested that your care partner not be given the details of your procedure findings, then the procedure report has been included in a sealed envelope for you to review at your convenience later.  YOU SHOULD EXPECT: Some feelings of bloating in the abdomen. Passage of more gas than usual.  Walking can help get rid of the air that was put into your GI tract during the procedure and reduce the bloating. If you had a lower endoscopy (such as a colonoscopy or flexible sigmoidoscopy) you may notice spotting of blood in your stool or on the toilet paper. If you underwent a bowel prep for your procedure, you may not have a normal bowel movement for a few days.  Please Note:  You might notice some irritation and congestion in your nose or some drainage.  This is from the oxygen used during your procedure.  There is no need for concern and it should clear up in a day or so.  SYMPTOMS TO REPORT IMMEDIATELY:  Following lower endoscopy (colonoscopy or flexible sigmoidoscopy):  Excessive amounts of blood in the stool  Significant tenderness or worsening of abdominal pains  Swelling of the abdomen that is new, acute  Fever of 100F or higher   For urgent or emergent issues, a gastroenterologist can be reached at any hour by calling [phone]. Do not use MyChart messaging for urgent concerns.    DIET:  We do recommend a small meal at first, but then you may proceed to your regular diet.  Drink plenty of fluids but you should avoid alcoholic beverages for 24 hours. Try to increase the fiber in your  diet, and drink plenty of water.  ACTIVITY:  You should plan to take it easy for the rest of today and you should NOT DRIVE or use heavy machinery until tomorrow (because of the sedation medicines used during the test).    FOLLOW UP: Our staff will call the number listed on your records the next business day following your procedure.  We will call around 7:15- 8:00 am to check on you and address any questions or concerns that you may have regarding the information given to you following your procedure. If we do not reach you, we will leave a message.     If any biopsies were taken you will be contacted by phone or by letter within the next 1-3 weeks.  Please call us at [phone] if you have not heard about the biopsies in 3 weeks.    SIGNATURES/CONFIDENTIALITY: You and/or your care partner have signed paperwork which will be entered into your electronic medical record.  These signatures attest to the fact that that the information above on your After Visit Summary has been reviewed and is understood.  Full responsibility of the confidentiality of this discharge information lies with you and/or your care-partner.

## 2023-03-01 NOTE — Progress Notes (Signed)
GASTROENTEROLOGY PROCEDURE H&P NOTE   Primary Care Physician: Gerre Scull, NP    Reason for Procedure:  History of adenomatous colon polyp  Plan:    Colonoscopy  Patient is appropriate for endoscopic procedure(s) in the ambulatory ([facility]) setting.  The nature of the procedure, as well as the risks, benefits, and alternatives were carefully and thoroughly reviewed with the patient. Ample time for discussion and questions allowed. The patient understood, was satisfied, and agreed to proceed.     HPI: Anita Holland is a 57 y.o. female who presents for surveillance colonoscopy.  Medical history as below.  Tolerated the prep.  No recent chest pain or shortness of breath.  No abdominal pain today.  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  Colposcopy showed inflammatory changes.  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  Recommend follow-up Pap smear 1 year   Peptic ulcer    Upper GI bleed 10/28/2017   Vaginal Pap smear, abnormal     Past Surgical History:  Procedure Laterality Date   DILATION AND CURETTAGE OF UTERUS     ESOPHAGOGASTRODUODENOSCOPY N/A 10/30/2017   Procedure: ESOPHAGOGASTRODUODENOSCOPY (EGD);  Surgeon: Napoleon Form, MD;  Location: [facility];  Service: Endoscopy;  Laterality: N/A;    Prior to Admission medications   Medication Sig Start Date End Date Taking? Authorizing Provider  acetaminophen (TYLENOL) 500 MG tablet Take by mouth. 10/29/19  Yes [provider]  losartan-hydrochlorothiazide (HYZAAR) 50-12.5 MG tablet Take 1 tablet by mouth daily. 08/07/22  Yes McElwee, Lauren A, NP  Multiple Vitamins-Minerals (CENTRUM FRESH/FRUITY 50+) CHEW Chew 1 tablet by mouth daily. 10/29/19  Yes [provider]  Cyanocobalamin (B-12 PO) Take by mouth. Patient not taking: Reported on 02/07/2023    [provider]  gabapentin (NEURONTIN) 300 MG capsule Take 1 capsule (300 mg total) by mouth at bedtime. 07/18/22   McElwee, Jake Church, NP  megestrol (MEGACE) 40 MG tablet Take 1 tablet (40 mg total) by mouth 2 (two) times daily. Can increase to two tablets twice a day in the event of heavy bleeding 12/05/21   Constant, Peggy, MD  potassium chloride (KLOR-CON M10) 10 MEQ tablet TAKE 1 TABLET BY MOUTH EVERY DAY Patient not taking: Reported on 02/07/2023 10/16/22   Gerre Scull, NP    Current Outpatient Medications  Medication Sig Dispense Refill   acetaminophen (TYLENOL) 500 MG tablet Take by mouth.     losartan-hydrochlorothiazide (HYZAAR) 50-12.5 MG tablet Take 1 tablet by mouth daily. 90 tablet 1   Multiple Vitamins-Minerals (CENTRUM FRESH/FRUITY 50+) CHEW Chew 1 tablet by mouth daily.     Cyanocobalamin (B-12 PO) Take by mouth. (Patient not taking: Reported on 02/07/2023)     gabapentin (NEURONTIN) 300 MG capsule Take 1 capsule (300 mg total) by mouth at bedtime. 90 capsule 1   megestrol (MEGACE) 40 MG tablet Take 1 tablet (40 mg total) by mouth 2 (two) times daily. Can increase to two tablets twice a day in the event of heavy bleeding 60 tablet 5   potassium chloride (KLOR-CON M10) 10 MEQ tablet TAKE 1 TABLET BY MOUTH EVERY DAY (Patient not taking: Reported on 02/07/2023) 90 tablet 0   Current Facility-Administered Medications  Medication  Dose Route Frequency Provider Last Rate Last Admin   0.9 %  sodium chloride infusion  500 mL Intravenous Continuous Varetta Chavers, Carie Caddy, MD        Allergies as of 03/01/2023   (No Known Allergies)    Family History  Problem Relation Age of Onset   Stroke Sister 28   Hypertension Sister    Depression Sister    Heart attack Sister 73   Colon cancer Neg Hx    Colon polyps Neg Hx    Esophageal cancer Neg Hx    Rectal cancer Neg Hx    Stomach cancer Neg Hx     Breast cancer Neg Hx     Social History   Socioeconomic History   Marital status: Single    Spouse name: Not on file   Number of children: 8   Years of education: Not on file   Highest education level: Not on file  Occupational History   Not on file  Tobacco Use   Smoking status: Never   Smokeless tobacco: Never  Vaping Use   Vaping status: Never Used  Substance and Sexual Activity   Alcohol use: No   Drug use: No   Sexual activity: Yes    Birth control/protection: Condom    Comment: 1st intercourse 15yo-5 partners  Other Topics Concern   Not on file  Social History Narrative   Not on file   Social Determinants of Health   Financial Resource Strain: Not on file  Food Insecurity: No Food Insecurity (07/05/2021)   [facility]   Hunger Vital Sign    Worried About Running Out of Food in the Last Year: Never true    Ran Out of Food in the Last Year: Never true  Transportation Needs: Not on file  Physical Activity: Not on file  Stress: Not on file  Social Connections: Unknown (11/07/2021)   [facility]   Social Network    Social Network: Not on file  Intimate Partner Violence: Unknown (09/28/2021)   Received from [facility]   HITS    Physically Hurt: Not on file    Insult or Talk Down To: Not on file    Threaten Physical Harm: Not on file    Scream or Curse: Not on file    Physical Exam: Vital signs in last 24 hours: @BP  (!) 141/97   Pulse 95   Temp 98.6 F (37 C) (Temporal)   Ht 5\' 3"  (1.6 m)   Wt 189 lb (85.7 kg)   SpO2 97%   BMI 33.48 kg/m  GEN: NAD EYE: Sclerae anicteric ENT: MMM CV: Non-tachycardic Pulm: CTA b/l GI: Soft, NT/ND NEURO:  Alert & Oriented x 3   Erick Blinks, MD [facility]  03/01/2023 3:34 PM

## 2023-03-02 ENCOUNTER — Telehealth: Payer: Self-pay

## 2023-03-02 NOTE — Telephone Encounter (Signed)
  Follow up Call-     03/01/2023    3:12 PM  Call back number  Post procedure Call Back phone  # [phone] (cell)  Permission to leave phone message Yes     Patient questions:  Do you have a fever, pain , or abdominal swelling? No. Pain Score  0 *  Have you tolerated food without any problems? Yes.    Have you been able to return to your normal activities? Yes.    Do you have any questions about your discharge instructions: Diet   No. Medications  No. Follow up visit  No.  Do you have questions or concerns about your Care? No.  Actions: * If pain score is 4 or above: No action needed, pain <4.

## 2023-03-05 ENCOUNTER — Other Ambulatory Visit: Payer: Self-pay | Admitting: Nurse Practitioner

## 2023-03-05 NOTE — Telephone Encounter (Signed)
Pt needs losartan-hydrochlorothiazide (HYZAAR) 50-12.5 MG tablet [[id number]] refilled at  [id number] - [place], [place] - [address] AT [address],  [place] [address] Phone: [phone]  Fax: [phone]

## 2023-03-06 NOTE — Telephone Encounter (Signed)
Patient needs an appointment

## 2023-03-06 NOTE — Telephone Encounter (Signed)
Requesting: LOSARTAN-HCTZ 50-12.5 MG TAB  Last Visit: 08/07/2022 Next Visit: Visit date not found Last Refill: 08/07/2022  Please Advise

## 2023-03-07 ENCOUNTER — Encounter: Payer: Self-pay | Admitting: Internal Medicine

## 2023-03-20 ENCOUNTER — Encounter: Payer: Commercial Managed Care - HMO | Admitting: Family Medicine

## 2023-04-02 ENCOUNTER — Encounter: Payer: Self-pay | Admitting: Family Medicine

## 2023-04-02 ENCOUNTER — Ambulatory Visit (INDEPENDENT_AMBULATORY_CARE_PROVIDER_SITE_OTHER): Payer: Managed Care, Other (non HMO) | Admitting: Family Medicine

## 2023-04-02 VITALS — BP 133/83 | HR 107 | Temp 98.2°F | Ht 62.5 in | Wt 202.0 lb

## 2023-04-02 DIAGNOSIS — I10 Essential (primary) hypertension: Secondary | ICD-10-CM | POA: Diagnosis not present

## 2023-04-02 DIAGNOSIS — E66812 Obesity, class 2: Secondary | ICD-10-CM | POA: Diagnosis not present

## 2023-04-02 DIAGNOSIS — Z6836 Body mass index (BMI) 36.0-36.9, adult: Secondary | ICD-10-CM

## 2023-04-02 DIAGNOSIS — E559 Vitamin D deficiency, unspecified: Secondary | ICD-10-CM

## 2023-04-02 DIAGNOSIS — R7303 Prediabetes: Secondary | ICD-10-CM | POA: Diagnosis not present

## 2023-04-02 NOTE — Assessment & Plan Note (Signed)
Blood pressure fairly well-managed on losartan/HCTZ 50/12.5 mg once daily She denies chest pain or headache  Look for improvements in blood pressure with weight reduction Continue current medication per PCP

## 2023-04-02 NOTE — Assessment & Plan Note (Signed)
Last vitamin D Lab Results  Component Value Date   VD25OH 32.08 06/15/2022   Her vitamin D level has been running in the low normal range for the past year.  She does complain of fatigue.  She is currently not taking a vitamin D supplement  Plan to recheck vitamin D level next visit

## 2023-04-02 NOTE — Progress Notes (Signed)
Office: [phone]  /  Fax: [phone]   Initial Visit  Anita Holland was seen in clinic today to evaluate for obesity. She is interested in losing weight to improve overall health and reduce the risk of weight related complications. She presents today to review program treatment options, initial physical assessment, and evaluation.     She was referred by: PCP  When asked what else they would like to accomplish? She states: Improve existing medical conditions, Improve quality of life, and Improve appearance  Weight history: weight has been slowly rising; had 8 kids and was < 130 lb.  She is at her max weight.  She sits a lot as a Engineer, materials, daytime hours  When asked how has your weight affected you? She states: Contributed to medical problems, Having fatigue, and Has affected mood   Some associated conditions: Hypertension, GERD, and Vitamin D Deficiency  Contributing factors: Family history, Stress, Reduced physical activity, Eating patterns, and Mental health problems  Weight promoting medications identified: None  Current nutrition plan: None  Current level of physical activity: None and NEAT  Current or previous pharmacotherapy: None  Response to medication: Never tried medications   Past medical history includes:   Past Medical History:  Diagnosis Date   Allergy    Anemia    Arthritis    Blood transfusion without reported diagnosis    Fibroid    GERD (gastroesophageal reflux disease)    Hypertension    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  Colposcopy showed inflammatory changes.  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  Recommend follow-up Pap smear 1 year   Peptic ulcer    Upper GI bleed 10/28/2017   Vaginal Pap smear, abnormal      Objective:   BP 133/83   Pulse (!) 107   Temp 98.2 F (36.8 C)   Ht 5' 2.5" (1.588 m)   Wt 202 lb (91.6 kg)   SpO2 100%   BMI 36.36 kg/m  She was weighed on  the bioimpedance scale: Body mass index is 36.36 kg/m.  Peak Weight:202 , Body Fat%:44.8, Visceral Fat Rating:13, Weight trend over the last 12 months: Increasing  General:  Alert, oriented and cooperative. Patient is in no acute distress.  Respiratory: Normal respiratory effort, no problems with respiration noted   Gait: able to ambulate independently  Mental Status: Normal mood and affect. Normal behavior. Normal judgment and thought content.   DIAGNOSTIC DATA REVIEWED:  BMET    Component Value Date/Time   NA 138 08/07/2022 1541   NA 141 11/13/2017 1633   K 4.0 08/07/2022 1541   CL 101 08/07/2022 1541   CO2 27 08/07/2022 1541   GLUCOSE 91 08/07/2022 1541   BUN 11 08/07/2022 1541   BUN 9 11/13/2017 1633   CREATININE 0.68 08/07/2022 1541   CREATININE 0.70 11/18/2021 1537   CALCIUM 10.1 08/07/2022 1541   GFRNONAA >60 07/22/2022 1012   GFRNONAA >89 03/27/2016 1559   GFRAA 119 11/13/2017 1633   GFRAA >89 03/27/2016 1559   Lab Results  Component Value Date   HGBA1C 5.8 06/15/2022   HGBA1C 5.4 11/02/2021   No results found for: "INSULIN" CBC    Component Value Date/Time   WBC 5.0 07/22/2022 1012   RBC 5.17 (H) 07/22/2022 1012   HGB 14.9 07/22/2022 1012   HGB 11.0 (L) 11/13/2017 1633   HCT 45.8 07/22/2022 1012   HCT 34.0 11/13/2017 1633   PLT 315 07/22/2022 1012  PLT 403 11/13/2017 1633   MCV 88.6 07/22/2022 1012   MCV 83 11/13/2017 1633   MCH 28.8 07/22/2022 1012   MCHC 32.5 07/22/2022 1012   RDW 13.6 07/22/2022 1012   RDW 16.3 (H) 11/13/2017 1633   Iron/TIBC/Ferritin/ %Sat    Component Value Date/Time   IRON 68 06/15/2022 1617   TIBC 399 06/15/2022 1617   FERRITIN 101 06/15/2022 1617   IRONPCTSAT 17 06/15/2022 1617   Lipid Panel     Component Value Date/Time   CHOL 171 06/15/2022 1617   TRIG 167.0 (H) 06/15/2022 1617   HDL 41.40 06/15/2022 1617   CHOLHDL 4 06/15/2022 1617   VLDL 33.4 06/15/2022 1617   LDLCALC 96 06/15/2022 1617   LDLCALC 126 (H)  05/02/2017 1618   Hepatic Function Panel     Component Value Date/Time   PROT 7.3 07/22/2022 1012   PROT 6.6 11/13/2017 1633   ALBUMIN 4.5 07/22/2022 1012   ALBUMIN 4.5 11/13/2017 1633   AST 14 (L) 07/22/2022 1012   ALT 21 07/22/2022 1012   ALKPHOS 40 07/22/2022 1012   BILITOT 0.9 07/22/2022 1012   BILITOT 0.4 11/13/2017 1633      Component Value Date/Time   TSH 1.19 06/15/2022 1617     Assessment and Plan:   Prediabetes Assessment & Plan: Lab Results  Component Value Date   HGBA1C 5.8 06/15/2022   Her A1c was in the prediabetic range in December.  She is concerned about developing type 2 diabetes.  She is high risk for type 2 diabetes with a BMI of 36 and a high visceral fat rating.  She admits to consuming excess starches and sweets.  She has not been consistent with any type of exercise.  Will look for improvements in A1c levels with dietary change, weight loss.  Consider use of medication.    Class 2 severe obesity due to excess calories with serious comorbidity and body mass index (BMI) of 36.0 to 36.9 in adult [facility])  Primary hypertension Assessment & Plan: Blood pressure fairly well-managed on losartan/HCTZ 50/12.5 mg once daily She denies chest pain or headache  Look for improvements in blood pressure with weight reduction Continue current medication per PCP   Vitamin D deficiency Assessment & Plan: Last vitamin D Lab Results  Component Value Date   VD25OH 32.08 06/15/2022   Her vitamin D level has been running in the low normal range for the past year.  She does complain of fatigue.  She is currently not taking a vitamin D supplement  Plan to recheck vitamin D level next visit         Obesity Treatment / Action Plan:  Patient will work on garnering support from family and friends to begin weight loss journey. Will work on eliminating or reducing the presence of highly palatable, calorie dense foods in the home. Will complete provided  nutritional and psychosocial assessment questionnaire before the next appointment. Will be scheduled for indirect calorimetry to determine resting energy expenditure in a fasting state.  This will allow [place] to create a reduced calorie, high-protein meal plan to promote loss of fat mass while preserving muscle mass. Will think about ideas on how to incorporate physical activity into their daily routine. Counseled on the health benefits of losing 5%-15% of total body weight. Was counseled on nutritional approaches to weight loss and benefits of reducing processed foods and consuming plant-based foods and high quality protein as part of nutritional weight management. Was counseled on pharmacotherapy and role as  an adjunct in weight management.   Obesity Education Performed Today:  She was weighed on the bioimpedance scale and results were discussed and documented in the synopsis.  We discussed obesity as a disease and the importance of a more detailed evaluation of all the factors contributing to the disease.  We discussed the importance of long term lifestyle changes which include nutrition, exercise and behavioral modifications as well as the importance of customizing this to her specific health and social needs.  We discussed the benefits of reaching a healthier weight to alleviate the symptoms of existing conditions and reduce the risks of the biomechanical, metabolic and psychological effects of obesity.  Althia Forts appears to be in the action stage of change and states they are ready to start intensive lifestyle modifications and behavioral modifications.  30 minutes was spent today on this visit including the above counseling, pre-visit chart review, and post-visit documentation.  Reviewed by clinician on day of visit: allergies, medications, problem list, medical history, surgical history, family history, social history, and previous encounter notes pertinent to obesity diagnosis.     Seymour Bars, D.O. DABFM, DABOM [facility] [phone] [place], [place] [address] [phone]

## 2023-04-02 NOTE — Assessment & Plan Note (Signed)
Lab Results  Component Value Date   HGBA1C 5.8 06/15/2022   Her A1c was in the prediabetic range in December.  She is concerned about developing type 2 diabetes.  She is high risk for type 2 diabetes with a BMI of 36 and a high visceral fat rating.  She admits to consuming excess starches and sweets.  She has not been consistent with any type of exercise.  Will look for improvements in A1c levels with dietary change, weight loss.  Consider use of medication.

## 2023-04-03 ENCOUNTER — Other Ambulatory Visit: Payer: Self-pay | Admitting: Nurse Practitioner

## 2023-04-03 ENCOUNTER — Telehealth: Payer: Self-pay | Admitting: Nurse Practitioner

## 2023-04-03 NOTE — Telephone Encounter (Signed)
04/03/23 - Pt called saying she is at the pharmacy to pick up her medication losartan-hydrochlorothiazide (HYZAAR) 50-12.5 MG tablet [[id number]] but pharmacy said they are waiting for the prior Authorization from the provider. Said prior Auth was faxed in this morning. Pt wants a call back at  [phone]

## 2023-04-04 ENCOUNTER — Other Ambulatory Visit: Payer: Self-pay

## 2023-04-04 ENCOUNTER — Emergency Department (HOSPITAL_COMMUNITY)
Admission: EM | Admit: 2023-04-04 | Discharge: 2023-04-05 | Disposition: A | Discharge status: Home / Self Care | Payer: Commercial Managed Care - HMO | Attending: Emergency Medicine | Admitting: Emergency Medicine

## 2023-04-04 ENCOUNTER — Encounter (HOSPITAL_COMMUNITY): Payer: Self-pay

## 2023-04-04 DIAGNOSIS — I10 Essential (primary) hypertension: Secondary | ICD-10-CM | POA: Diagnosis not present

## 2023-04-04 DIAGNOSIS — R1031 Right lower quadrant pain: Secondary | ICD-10-CM | POA: Insufficient documentation

## 2023-04-04 LAB — CBC
HCT: 48.4 % — ABNORMAL HIGH (ref 36.0–46.0)
Hemoglobin: 15.5 g/dL — ABNORMAL HIGH (ref 12.0–15.0)
MCH: 29.4 pg (ref 26.0–34.0)
MCHC: 32 g/dL (ref 30.0–36.0)
MCV: 91.7 fL (ref 80.0–100.0)
Platelets: 327 10*3/uL (ref 150–400)
RBC: 5.28 MIL/uL — ABNORMAL HIGH (ref 3.87–5.11)
RDW: 13.8 % (ref 11.5–15.5)
WBC: 6.3 10*3/uL (ref 4.0–10.5)
nRBC: 0 % (ref 0.0–0.2)

## 2023-04-04 LAB — COMPREHENSIVE METABOLIC PANEL
ALT: 24 U/L (ref 0–44)
AST: 21 U/L (ref 15–41)
Albumin: 4.7 g/dL (ref 3.5–5.0)
Alkaline Phosphatase: 53 U/L (ref 38–126)
Anion gap: 9 (ref 5–15)
BUN: 14 mg/dL (ref 6–20)
CO2: 25 mmol/L (ref 22–32)
Calcium: 9.4 mg/dL (ref 8.9–10.3)
Chloride: 105 mmol/L (ref 98–111)
Creatinine, Ser: 0.63 mg/dL (ref 0.44–1.00)
GFR, Estimated: >60 mL/min (ref >60)
Glucose, Bld: 100 mg/dL — ABNORMAL HIGH (ref 70–99)
Potassium: 4.3 mmol/L (ref 3.5–5.1)
Sodium: 139 mmol/L (ref 135–145)
Total Bilirubin: 0.6 mg/dL (ref 0.3–1.2)
Total Protein: 7.7 g/dL (ref 6.5–8.1)

## 2023-04-04 LAB — LIPASE, BLOOD: Lipase: 36 U/L (ref 11–51)

## 2023-04-04 LAB — HCG, SERUM, QUALITATIVE: Preg, Serum: NEGATIVE

## 2023-04-04 MED ORDER — LOSARTAN POTASSIUM-HCTZ 50-12.5 MG PO TABS: 1.0000 | ORAL_TABLET | Freq: Every day | ORAL | 0 refills | Status: DC

## 2023-04-04 NOTE — Telephone Encounter (Signed)
I called patient and she said that she has no more medication and pharmacy said that prior auth is needed. I called [facility] and pharamcy tech said that patient needed refills of medication only.  Requesting: Hyzaar Last Visit: 08/07/2022 Next Visit: Visit date not found Last Refill: 03/06/2023 was suppose to follow up in 3-4 weeks  Please Advise   Patient is scheduled for an appointment for her BP on 04-12-23 at 3pm

## 2023-04-04 NOTE — ED Triage Notes (Signed)
Sent by UC for evaluation for possible appendicitis. Pt had blood in urine at UC. Pt has had right sided abdominal pain and flank pain since Sunday. C/o diarrhea and nausea. Denies vomiting.

## 2023-04-05 ENCOUNTER — Emergency Department (HOSPITAL_COMMUNITY): Payer: Commercial Managed Care - HMO

## 2023-04-05 ENCOUNTER — Encounter (HOSPITAL_COMMUNITY): Payer: Self-pay

## 2023-04-05 LAB — URINALYSIS, ROUTINE W REFLEX MICROSCOPIC
Bacteria, UA: NONE SEEN
Bilirubin Urine: NEGATIVE
Glucose, UA: NEGATIVE mg/dL
Ketones, ur: NEGATIVE mg/dL
Nitrite: NEGATIVE
Protein, ur: 30 mg/dL — AB
Specific Gravity, Urine: 1.025 (ref 1.005–1.030)
WBC, UA: >50 WBC/hpf (ref 0–5)
pH: 5 (ref 5.0–8.0)

## 2023-04-05 MED ORDER — ONDANSETRON 4 MG PO TBDP
4.0000 mg | ORAL_TABLET | Freq: Three times a day (TID) | ORAL | 0 refills | Status: DC | PRN
Start: 2023-04-05 — End: 2023-07-24

## 2023-04-05 MED ORDER — ONDANSETRON HCL 4 MG/2ML IJ SOLN
4.0000 mg | Freq: Once | INTRAMUSCULAR | Status: AC
  Administered 2023-04-05: 4 mg via INTRAVENOUS
  Filled 2023-04-05: qty 2

## 2023-04-05 MED ORDER — OXYCODONE-ACETAMINOPHEN 5-325 MG PO TABS
1.0000 | ORAL_TABLET | Freq: Four times a day (QID) | ORAL | 0 refills | Status: DC | PRN
Start: 2023-04-05 — End: 2023-05-07

## 2023-04-05 MED ORDER — IOHEXOL 300 MG/ML  SOLN
100.0000 mL | Freq: Once | INTRAMUSCULAR | Status: AC | PRN
  Administered 2023-04-05: 100 mL via INTRAVENOUS

## 2023-04-05 MED ORDER — SODIUM CHLORIDE (PF) 0.9 % IJ SOLN
INTRAMUSCULAR | Status: AC
  Filled 2023-04-05: qty 50

## 2023-04-05 MED ORDER — NAPROXEN 500 MG PO TABS
500.0000 mg | ORAL_TABLET | Freq: Two times a day (BID) | ORAL | 0 refills | Status: DC
Start: 2023-04-05 — End: 2023-07-24

## 2023-04-05 MED ORDER — MORPHINE SULFATE (PF) 4 MG/ML IV SOLN
4.0000 mg | Freq: Once | INTRAVENOUS | Status: AC
  Administered 2023-04-05: 4 mg via INTRAVENOUS
  Filled 2023-04-05: qty 1

## 2023-04-05 NOTE — ED Provider Notes (Signed)
[place] EMERGENCY DEPARTMENT AT [facility] Provider Note   CSN: [id number] Arrival date & time: 04/04/23  1825     History  Chief Complaint  Patient presents with   Abdominal Pain    Anita Holland is a 57 y.o. past medical history of hypertension, peptic ulcer presents today for evaluation of abdominal pain.  Pain is in the right lower quadrant and right upper quadrant and right-sided flank, constant, nonradiating. She reports last bowel meant was earlier today with normal stool.  She denies any hematuria or blood in her stools.  She endorses nausea without vomiting.  She denies any fever, cold/chills.   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  Colposcopy showed inflammatory changes.  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  Recommend follow-up Pap smear 1 year   Peptic ulcer    Upper GI bleed 10/28/2017   Vaginal Pap smear, abnormal    Past Surgical History:  Procedure Laterality Date   DILATION AND CURETTAGE OF UTERUS     ESOPHAGOGASTRODUODENOSCOPY N/A 10/30/2017   Procedure: ESOPHAGOGASTRODUODENOSCOPY (EGD);  Surgeon: Napoleon Form, MD;  Location: [facility];  Service: Endoscopy;  Laterality: N/A;     Home Medications Prior to Admission medications   Medication Sig Start Date End Date Taking? Authorizing Provider  acetaminophen (TYLENOL) 500 MG tablet Take by mouth. 10/29/19   [provider]  Cyanocobalamin (B-12 PO) Take by mouth. Patient not taking: Reported on 02/07/2023    [provider]  gabapentin (NEURONTIN) 300 MG capsule Take 1 capsule (300 mg total) by mouth at bedtime. 07/18/22   McElwee, Lauren A, NP  losartan-hydrochlorothiazide (HYZAAR) 50-12.5 MG tablet Take 1  tablet by mouth daily. 04/04/23   McElwee, Jake Church, NP  megestrol (MEGACE) 40 MG tablet Take 1 tablet (40 mg total) by mouth 2 (two) times daily. Can increase to two tablets twice a day in the event of heavy bleeding 12/05/21   Constant, Peggy, MD  Multiple Vitamins-Minerals (CENTRUM FRESH/FRUITY 50+) CHEW Chew 1 tablet by mouth daily. 10/29/19   [provider]  potassium chloride (KLOR-CON M10) 10 MEQ tablet TAKE 1 TABLET BY MOUTH EVERY DAY Patient not taking: Reported on 02/07/2023 10/16/22   Gerre Scull, NP      Allergies    Patient has no known allergies.    Review of Systems   Review of Systems  Gastrointestinal:  Positive for abdominal pain.    Physical Exam Updated Vital Signs BP 129/80 (BP Location: Right Arm)   Pulse 85   Temp 98.3 F (36.8 C) (Oral)   Resp 18   Ht 5\' 3"  (1.6 m)   Wt 97.5 kg   SpO2 95%   BMI 38.09 kg/m  Physical Exam Vitals and nursing note reviewed.  Constitutional:      Appearance: Normal appearance.  HENT:     Head: Normocephalic and atraumatic.     Mouth/Throat:     Mouth: Mucous membranes are moist.  Eyes:     General: No scleral icterus. Cardiovascular:     Rate and Rhythm: Normal rate and regular rhythm.     Pulses: Normal pulses.     Heart sounds: Normal heart sounds.  Pulmonary:     Effort: Pulmonary effort is normal.     Breath sounds: Normal breath sounds.  Abdominal:     General: Abdomen is flat.     Palpations: Abdomen is soft.     Tenderness: There is abdominal tenderness (right lower quadrant, right sided flank).  Musculoskeletal:        General: No deformity.  Skin:    General: Skin is warm.     Findings: No rash.  Neurological:     General: No focal deficit present.     Mental Status: She is alert.  Psychiatric:        Mood and Affect: Mood normal.    ED Results / Procedures / Treatments   Labs (all labs ordered are listed, but only abnormal results are displayed) Labs Reviewed  COMPREHENSIVE  METABOLIC PANEL - Abnormal; Notable for the following components:      Result Value   Glucose, Bld 100 (*)    All other components within normal limits  CBC - Abnormal; Notable for the following components:   RBC 5.28 (*)    Hemoglobin 15.5 (*)    HCT 48.4 (*)    All other components within normal limits  LIPASE, BLOOD  HCG, SERUM, QUALITATIVE  URINALYSIS, ROUTINE W REFLEX MICROSCOPIC    EKG None  Radiology No results found.  Procedures Procedures    Medications Ordered in ED Medications  morphine (PF) 4 MG/ML injection 4 mg (has no administration in time range)    ED Course/ Medical Decision Making/ A&P                                 Medical Decision Making Amount and/or Complexity of Data Reviewed Labs: ordered. Radiology: ordered.  Risk Prescription drug management.   This patient presents to the ED for abdominal pain, this involves an extensive number of treatment options, and is a complaint that carries with a high risk of complications and morbidity.  The differential diagnosis includes diverticulitis, hernia, diverticulitis, UTI, constipation, ectopic pregnancy, ovarian torsion, ovarian cyst, PID/TOA, period/fibroid..  This is not an exhaustive list.  Lab tests: I ordered and personally interpreted labs.  The pertinent results include: WBC unremarkable. Hbg unremarkable. Platelets unremarkable. Electrolytes unremarkable. BUN, creatinine unremarkable. Lipase is normal. Hcg level is negative. UA is positive for leukocytes and Hgb.  Imaging studies: I ordered imaging studies, personally reviewed, interpreted imaging and agree with the radiologist's interpretations. The results include: 1. Nonobstructive 1.2 x 0.9 cm right nephrolithiasis. 2. Colonic diverticulosis with no acute diverticulitis. 3. Enlarged fibroid uterus.  Problem list/ ED course/ Critical interventions/ Medical management: HPI: See above Vital signs within normal range and stable throughout  visit. Laboratory/imaging studies significant for: See above. On physical examination, patient is afebrile and appears in no acute distress. Abdominal exam without peritoneal signs. No evidence of acute abdomen at this time. Well appearing. Considered and doubt ovarian torsion given history and presentation. Given work up low suspicion for acute hepatobiliary disease (including acute cholecystitis), acute pancreatitis (neg lipase), PUD and gastric perforation, acute infectious processes (pneumonia, hepatitis, pyelonephritis), acute appendicitis, vascular catastrophe, bowel obstruction or viscus perforation, diverticulitis.  CT scan showed a nonobstructive 1.2 x 0.9 cm right nephrolithiasis, colonic diverticulosis and enlarged fibroid uterus.  CT scan discussed with patient.  Advised patient to follow-up with PCP, urology and OB/GYN for further evaluation and management.  Strict ED return precaution discussed.  I sent a  short course of oxycodone for pain control at home. I have reviewed the patient home medicines and have made adjustments as needed.  Cardiac monitoring/EKG: The patient was maintained on a cardiac monitor.  I personally reviewed and interpreted the cardiac monitor which showed an underlying rhythm of: sinus rhythm.  Additional history obtained: External records from outside source obtained and reviewed including: Chart review including previous notes, labs, imaging.  Consultations obtained:  Disposition Continued outpatient therapy. Follow-up with PCP, urology and OB/GYN recommended for reevaluation of symptoms. Treatment plan discussed with patient.  Pt acknowledged understanding was agreeable to the plan. Worrisome signs and symptoms were discussed with patient, and patient acknowledged understanding to return to the ED if they noticed these signs and symptoms. Patient was stable upon discharge.   This chart was dictated using voice recognition software.  Despite best efforts to  proofread,  errors can occur which can change the documentation meaning.          Final Clinical Impression(s) / ED Diagnoses Final diagnoses:  Right lower quadrant abdominal pain    Rx / DC Orders ED Discharge Orders          Ordered    oxyCODONE-acetaminophen (PERCOCET/ROXICET) 5-325 MG tablet  Every 6 hours PRN        04/05/23 0424    ondansetron (ZOFRAN-ODT) 4 MG disintegrating tablet  Every 8 hours PRN        04/05/23 0424    naproxen (NAPROSYN) 500 MG tablet  2 times daily        04/05/23 0424              Jeanelle Malling, PA 04/05/23 1441    Tilden Fossa, MD 04/06/23 [phone]

## 2023-04-05 NOTE — Discharge Instructions (Addendum)
Please take tylenol/ibuprofen/naproxen for pain. I recommend close follow-up with PCP for reevaluation.  Please do not hesitate to return to emergency department if worrisome signs symptoms we discussed become apparent.

## 2023-04-06 ENCOUNTER — Ambulatory Visit: Payer: Commercial Managed Care - HMO | Admitting: Family Medicine

## 2023-04-12 ENCOUNTER — Ambulatory Visit: Payer: Managed Care, Other (non HMO) | Admitting: Nurse Practitioner

## 2023-04-12 ENCOUNTER — Encounter: Payer: Self-pay | Admitting: Nurse Practitioner

## 2023-04-12 VITALS — BP 124/84 | HR 114 | Temp 96.6°F | Resp 18 | Wt 209.0 lb

## 2023-04-12 DIAGNOSIS — I10 Essential (primary) hypertension: Secondary | ICD-10-CM

## 2023-04-12 DIAGNOSIS — N200 Calculus of kidney: Secondary | ICD-10-CM | POA: Insufficient documentation

## 2023-04-12 MED ORDER — MEGESTROL ACETATE 40 MG PO TABS: 40.0000 mg | ORAL_TABLET | Freq: Two times a day (BID) | ORAL | 5 refills | Status: DC

## 2023-04-12 MED ORDER — LOSARTAN POTASSIUM-HCTZ 50-12.5 MG PO TABS: 1.0000 | ORAL_TABLET | Freq: Every day | ORAL | 3 refills | Status: DC

## 2023-04-12 NOTE — Patient Instructions (Signed)
It was great to see you!  I have placed a referral to urology.   Keep drinking plenty of water  [facility] Emergency Department at [facility] Address: [address], [address] [place], [place] [address] Phone: [phone]  Let's follow-up if symptoms worsen or any concerns.   Take care,  Rodman Pickle, NP

## 2023-04-12 NOTE — Assessment & Plan Note (Addendum)
She has started having right lower quadrant pain on 04/01/23. She went to the ER on 04/04/23 and had a CT scan which showed a Nonobstructive 1.2 x 0.9 cm right nephrolithiasis. She is still having intermittent RLQ pain. She has been taking oxycodone as needed for pain. Continue drinking plenty of water. Referral placed to urology.

## 2023-04-12 NOTE — Progress Notes (Signed)
Established Patient Office Visit  Subjective   Patient ID: Anita Holland, female    DOB: [birth year]  Age: 57 y.o. MRN: [id number]  Chief Complaint  Patient presents with   Acute Visit    PT is here for right lower side pain for 1 week when coughing. Patient is taking oxycodone for pain that was prescribe at urgent care. Patient stated she was told she have kidney stones     HPI  Anita Holland is here for a refill on her blood pressure medication and right side pain with coughing.   She states the right side pain started a week ago. She woke up on Sunday and felt like she had been punched and her entire lower abdomen was hurting. She denies fevers. She went to the ER on 04/04/23 and had a CT scan which showed diverticulosis, non-obstructive right nephrolithiasis, and enlarged fibroid uterus. She has been taking oxycodone as needed since being discharged. She states the pain has gotten better. She is trying to drink a lot of water.   She states her blood pressure has been doing well. She has been checking it regularly. She denies chest pain and shortness of breath. She needs a refill on her blood pressure medication.     ROS See pertinent positives and negatives per HPI.    Objective:     BP 124/84 (BP Location: Left Arm, Patient Position: Sitting, Cuff Size: Large)   Pulse (!) 114 Comment: tachy reading  Temp (!) 96.6 F (35.9 C) (Temporal)   Resp 18   Wt 209 lb (94.8 kg)   SpO2 95%   BMI 37.02 kg/m    Physical Exam Vitals and nursing note reviewed.  Constitutional:      General: She is not in acute distress.    Appearance: Normal appearance.  HENT:     Head: Normocephalic.  Eyes:     Conjunctiva/sclera: Conjunctivae normal.  Cardiovascular:     Rate and Rhythm: Normal rate and regular rhythm.     Pulses: Normal pulses.     Heart sounds: Normal heart sounds.  Pulmonary:     Effort: Pulmonary effort is normal.     Breath sounds: Normal breath sounds.  Abdominal:      Palpations: Abdomen is soft.     Tenderness: There is abdominal tenderness (RLQ). There is no guarding.     Hernia: No hernia is present.  Musculoskeletal:     Cervical back: Normal range of motion.  Skin:    General: Skin is warm.  Neurological:     General: No focal deficit present.     Mental Status: She is alert and oriented to person, place, and time.  Psychiatric:        Mood and Affect: Mood normal.        Behavior: Behavior normal.        Thought Content: Thought content normal.        Judgment: Judgment normal.    The 10-year ASCVD risk score (Arnett DK, et al., 2019) is: 5.3%    Assessment & Plan:   Problem List Items Addressed This Visit       Cardiovascular and Mediastinum   Primary hypertension    Chronic, stable. Continue losartan-hydrochlorothiazide 50-12.5mg  daily. CMP and CBC reviewed from ER visit on 04/04/23.. Follow-up in 6 months.       Relevant Medications   losartan-hydrochlorothiazide (HYZAAR) 50-12.5 MG tablet     Genitourinary   Kidney stone - Primary    She  has started having right lower quadrant pain on 04/01/23. She went to the ER on 04/04/23 and had a CT scan which showed a Nonobstructive 1.2 x 0.9 cm right nephrolithiasis. She is still having intermittent RLQ pain. She has been taking oxycodone as needed for pain. Continue drinking plenty of water. Referral placed to urology.       Relevant Orders   Ambulatory referral to Urology    Return if symptoms worsen or fail to improve.    Gerre Scull, NP

## 2023-04-12 NOTE — Assessment & Plan Note (Addendum)
Chronic, stable. Continue losartan-hydrochlorothiazide 50-12.5mg  daily. CMP and CBC reviewed from ER visit on 04/04/23.. Follow-up in 6 months.

## 2023-04-19 ENCOUNTER — Ambulatory Visit: Payer: Managed Care, Other (non HMO) | Admitting: Urology

## 2023-04-19 NOTE — Progress Notes (Deleted)
Assessment: 1. Nephrolithiasis     Plan: I personally reviewed the patient's chart including provider notes, lab and imaging results.    Chief Complaint: No chief complaint on file.   History of Present Illness:  Anita Holland is a 57 y.o. female who is seen in consultation from [facility], NP for evaluation of nephrolithiasis. CT abdomen and pelvis with contrast from 04/05/23 done for right abdominal pain showed a 1.2 x 0.9 cm right renal calculus with slight fullness of right collecting system.   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  Colposcopy showed inflammatory changes.  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  Recommend follow-up Pap smear 1 year   Peptic ulcer    Upper GI bleed 10/28/2017   Vaginal Pap smear, abnormal     Past Surgical History:  Past Surgical History:  Procedure Laterality Date   DILATION AND CURETTAGE OF UTERUS     ESOPHAGOGASTRODUODENOSCOPY N/A 10/30/2017   Procedure: ESOPHAGOGASTRODUODENOSCOPY (EGD);  Surgeon: Napoleon Form, MD;  Location: [facility];  Service: Endoscopy;  Laterality: N/A;    Allergies:  No Known Allergies  Family History:  Family History  Problem Relation Age of Onset   Stroke Sister 80   Hypertension Sister    Depression Sister    Heart attack Sister 19   Colon cancer Neg Hx    Colon polyps Neg Hx    Esophageal cancer Neg Hx    Rectal cancer Neg Hx    Stomach cancer Neg Hx    Breast cancer Neg Hx     Social History:  Social History   Tobacco Use   Smoking status: Never   Smokeless tobacco: Never  Vaping Use   Vaping status: Never Used  Substance Use Topics   Alcohol use: No   Drug use: No    Review of symptoms:   Constitutional:  Negative for unexplained weight loss, night sweats, fever, chills ENT:  Negative for nose bleeds, sinus pain, painful swallowing CV:  Negative for chest pain, shortness of breath, exercise intolerance, palpitations, loss of consciousness Resp:  Negative for cough, wheezing, shortness of breath GI:  Negative for nausea, vomiting, diarrhea, bloody stools GU:  Positives noted in HPI; otherwise negative for gross hematuria, dysuria, urinary incontinence Neuro:  Negative for seizures, poor balance, limb weakness, slurred speech Psych:  Negative for lack of energy, depression, anxiety Endocrine:  Negative for polydipsia, polyuria, symptoms of hypoglycemia (dizziness, hunger, sweating) Hematologic:  Negative for anemia, purpura, petechia, prolonged or excessive bleeding, use of anticoagulants  Allergic:  Negative for difficulty breathing or choking as a result of exposure to anything; no shellfish allergy; no allergic response (rash/itch) to materials, foods  Physical exam: There were no vitals taken for this visit. GENERAL APPEARANCE:  Well appearing, well developed, well nourished, NAD HEENT: Atraumatic, Normocephalic, oropharynx clear. NECK: Supple without lymphadenopathy or thyromegaly. LUNGS: Clear to auscultation bilaterally. HEART: Regular Rate and Rhythm without murmurs, gallops, or rubs. ABDOMEN: Soft, non-tender, No Masses. EXTREMITIES: Moves all extremities well.  Without clubbing, cyanosis, or edema. NEUROLOGIC:  Alert and oriented x 3, normal gait, CN II-XII grossly intact.  MENTAL STATUS:  Appropriate. BACK:  Non-tender to palpation.  No CVAT SKIN:  Warm, dry and intact.    Results: No results found for this or any previous visit (from the past 24 hour(s)).

## 2023-04-30 ENCOUNTER — Ambulatory Visit: Payer: Self-pay | Admitting: Urology

## 2023-04-30 ENCOUNTER — Encounter: Payer: Self-pay | Admitting: Urology

## 2023-04-30 ENCOUNTER — Ambulatory Visit (HOSPITAL_BASED_OUTPATIENT_CLINIC_OR_DEPARTMENT_OTHER)
Admission: RE | Admit: 2023-04-30 | Discharge: 2023-04-30 | Disposition: A | Discharge status: Home / Self Care | Payer: Managed Care, Other (non HMO) | Source: Ambulatory Visit | Attending: Urology | Admitting: Urology

## 2023-04-30 ENCOUNTER — Ambulatory Visit (INDEPENDENT_AMBULATORY_CARE_PROVIDER_SITE_OTHER): Payer: Managed Care, Other (non HMO) | Admitting: Urology

## 2023-04-30 VITALS — BP 133/90 | HR 105 | Ht 63.0 in | Wt 200.0 lb

## 2023-04-30 DIAGNOSIS — N200 Calculus of kidney: Secondary | ICD-10-CM | POA: Insufficient documentation

## 2023-04-30 LAB — URINALYSIS, ROUTINE W REFLEX MICROSCOPIC
Bilirubin, UA: NEGATIVE
Glucose, UA: NEGATIVE
Ketones, UA: NEGATIVE
Leukocytes,UA: NEGATIVE
Nitrite, UA: NEGATIVE
Protein,UA: NEGATIVE
Specific Gravity, UA: 1.025 (ref 1.005–1.030)
Urobilinogen, Ur: 1 mg/dL (ref 0.2–1.0)
pH, UA: 6 (ref 5.0–7.5)

## 2023-04-30 LAB — MICROSCOPIC EXAMINATION
Bacteria, UA: NONE SEEN
RBC, Urine: NONE SEEN /[HPF] (ref 0–2)

## 2023-04-30 NOTE — Progress Notes (Signed)
Assessment: 1. Nephrolithiasis; right     Plan: I personally reviewed the patient's chart including provider notes, lab and imaging results. I personally reviewed the CT study from 04/05/2023 with results as noted below. I discussed management options for a renal calculus including observation, shockwave lithotripsy, ureteroscopic laser lithotripsy, and percutaneous nephrolithotomy.  Risk of each procedure discussed. Following our discussion, she would like to proceed with shockwave lithotripsy. KUB today to ensure visualization of the stone.   Procedure: The patient will be scheduled for right ESL at [facility]. Surgical request is placed with the surgery schedulers and will be scheduled at the patient's/family request. Informed consent is given as documented below. Anesthesia:  local  The patient does not have sleep apnea, history of MRSA, history of VRE, history of cardiac device requiring special anesthetic needs. Patient is stable and considered clear for surgical in an outpatient ambulatory surgery setting as well as patient hospital setting.  Consent for Operation or Procedure: Provider Certification I hereby certify that the nature, purpose, benefits, usual and most frequent risks of, and alternatives to, the operation or procedure have been explained to the patient (or person authorized to sign for the patient) either by me as responsible physician or by the provider who is to perform the operation or procedure. Time spent such that the patient/family has had an opportunity to ask questions, and that those questions have been answered. The patient or the patient's representative has been advised that selected tasks may be performed by assistants to the primary health care provider(s). I believe that the patient (or person authorized to sign for the patient) understands what has been explained, and has consented to the operation or procedure. No guarantees were implied  or made.   Chief Complaint:  Chief Complaint  Patient presents with   Nephrolithiasis    History of Present Illness:  Anita Holland is a 57 y.o. female who is seen in consultation from [facility], NP for evaluation of nephrolithiasis.  She presented to the emergency room on 04/05/2023 with a 3-day history of right sided abdominal pain.  Urinalysis showed 21-50 RBCs, >50 WBCs.  Creatinine normal at 0.63. CT abdomen and pelvis with contrast from 04/05/23 done for right abdominal pain showed a 1.2 x 0.9 cm right renal calculus with slight fullness of right collecting system. She has not had any further abdominal or flank pain.  No dysuria or gross hematuria.   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  Colposcopy showed inflammatory changes.  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  Recommend follow-up Pap smear 1 year   Peptic ulcer    Upper GI bleed 10/28/2017   Vaginal Pap smear, abnormal     Past Surgical History:  Past Surgical History:  Procedure Laterality Date   DILATION AND CURETTAGE OF UTERUS     ESOPHAGOGASTRODUODENOSCOPY N/A 10/30/2017   Procedure: ESOPHAGOGASTRODUODENOSCOPY (EGD);  Surgeon: Napoleon Form, MD;  Location: [facility];  Service: Endoscopy;  Laterality: N/A;    Allergies:  No Known Allergies  Family History:  Family History  Problem Relation Age of Onset   Stroke Sister 28   Hypertension Sister    Depression Sister  Heart attack Sister 27   Colon cancer Neg Hx    Colon polyps Neg Hx    Esophageal cancer Neg Hx    Rectal cancer Neg Hx    Stomach cancer Neg Hx    Breast cancer Neg Hx     Social History:  Social History   Tobacco Use   Smoking status: Never    Smokeless tobacco: Never  Vaping Use   Vaping status: Never Used  Substance Use Topics   Alcohol use: No   Drug use: No    Review of symptoms:  Constitutional:  Negative for unexplained weight loss, night sweats, fever, chills ENT:  Negative for nose bleeds, sinus pain, painful swallowing CV:  Negative for chest pain, shortness of breath, exercise intolerance, palpitations, loss of consciousness Resp:  Negative for cough, wheezing, shortness of breath GI:  Negative for nausea, vomiting, diarrhea, bloody stools GU:  Positives noted in HPI; otherwise negative for gross hematuria, dysuria, urinary incontinence Neuro:  Negative for seizures, poor balance, limb weakness, slurred speech Psych:  Negative for lack of energy, depression, anxiety Endocrine:  Negative for polydipsia, polyuria, symptoms of hypoglycemia (dizziness, hunger, sweating) Hematologic:  Negative for anemia, purpura, petechia, prolonged or excessive bleeding, use of anticoagulants  Allergic:  Negative for difficulty breathing or choking as a result of exposure to anything; no shellfish allergy; no allergic response (rash/itch) to materials, foods  Physical exam: BP (!) 133/90   Pulse (!) 105   Ht 5\' 3"  (1.6 m)   Wt 200 lb (90.7 kg)   BMI 35.43 kg/m  GENERAL APPEARANCE:  Well appearing, well developed, well nourished, NAD HEENT: Atraumatic, Normocephalic, oropharynx clear. NECK: Supple without lymphadenopathy or thyromegaly. LUNGS: Clear to auscultation bilaterally. HEART: Regular Rate and Rhythm without murmurs, gallops, or rubs. ABDOMEN: Soft, non-tender, No Masses. EXTREMITIES: Moves all extremities well.  Without clubbing, cyanosis, or edema. NEUROLOGIC:  Alert and oriented x 3, normal gait, CN II-XII grossly intact.  MENTAL STATUS:  Appropriate. BACK:  Non-tender to palpation.  No CVAT SKIN:  Warm, dry and intact.    Results: U/A:  0-5 WBC, 0 RBC

## 2023-04-30 NOTE — H&P (View-Only) (Signed)
 Assessment: 1. Nephrolithiasis; right     Plan: I personally reviewed the patient's chart including provider notes, lab and imaging results. I personally reviewed the CT study from 04/05/2023 with results as noted below. I discussed management options for a renal calculus including observation, shockwave lithotripsy, ureteroscopic laser lithotripsy, and percutaneous nephrolithotomy.  Risk of each procedure discussed. Following our discussion, she would like to proceed with shockwave lithotripsy. KUB today to ensure visualization of the stone.   Procedure: The patient will be scheduled for right ESL at [facility]. Surgical request is placed with the surgery schedulers and will be scheduled at the patient's/family request. Informed consent is given as documented below. Anesthesia:  local  The patient does not have sleep apnea, history of MRSA, history of VRE, history of cardiac device requiring special anesthetic needs. Patient is stable and considered clear for surgical in an outpatient ambulatory surgery setting as well as patient hospital setting.  Consent for Operation or Procedure: Provider Certification I hereby certify that the nature, purpose, benefits, usual and most frequent risks of, and alternatives to, the operation or procedure have been explained to the patient (or person authorized to sign for the patient) either by me as responsible physician or by the provider who is to perform the operation or procedure. Time spent such that the patient/family has had an opportunity to ask questions, and that those questions have been answered. The patient or the patient's representative has been advised that selected tasks may be performed by assistants to the primary health care provider(s). I believe that the patient (or person authorized to sign for the patient) understands what has been explained, and has consented to the operation or procedure. No guarantees were implied  or made.   Chief Complaint:  Chief Complaint  Patient presents with   Nephrolithiasis    History of Present Illness:  Anita Holland is a 57 y.o. female who is seen in consultation from [facility], NP for evaluation of nephrolithiasis.  She presented to the emergency room on 04/05/2023 with a 3-day history of right sided abdominal pain.  Urinalysis showed 21-50 RBCs, >50 WBCs.  Creatinine normal at 0.63. CT abdomen and pelvis with contrast from 04/05/23 done for right abdominal pain showed a 1.2 x 0.9 cm right renal calculus with slight fullness of right collecting system. She has not had any further abdominal or flank pain.  No dysuria or gross hematuria.   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  Colposcopy showed inflammatory changes.  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  Recommend follow-up Pap smear 1 year   Peptic ulcer    Upper GI bleed 10/28/2017   Vaginal Pap smear, abnormal     Past Surgical History:  Past Surgical History:  Procedure Laterality Date   DILATION AND CURETTAGE OF UTERUS     ESOPHAGOGASTRODUODENOSCOPY N/A 10/30/2017   Procedure: ESOPHAGOGASTRODUODENOSCOPY (EGD);  Surgeon: Napoleon Form, MD;  Location: [facility];  Service: Endoscopy;  Laterality: N/A;    Allergies:  No Known Allergies  Family History:  Family History  Problem Relation Age of Onset   Stroke Sister 28   Hypertension Sister    Depression Sister  Heart attack Sister 27   Colon cancer Neg Hx    Colon polyps Neg Hx    Esophageal cancer Neg Hx    Rectal cancer Neg Hx    Stomach cancer Neg Hx    Breast cancer Neg Hx     Social History:  Social History   Tobacco Use   Smoking status: Never    Smokeless tobacco: Never  Vaping Use   Vaping status: Never Used  Substance Use Topics   Alcohol use: No   Drug use: No    Review of symptoms:  Constitutional:  Negative for unexplained weight loss, night sweats, fever, chills ENT:  Negative for nose bleeds, sinus pain, painful swallowing CV:  Negative for chest pain, shortness of breath, exercise intolerance, palpitations, loss of consciousness Resp:  Negative for cough, wheezing, shortness of breath GI:  Negative for nausea, vomiting, diarrhea, bloody stools GU:  Positives noted in HPI; otherwise negative for gross hematuria, dysuria, urinary incontinence Neuro:  Negative for seizures, poor balance, limb weakness, slurred speech Psych:  Negative for lack of energy, depression, anxiety Endocrine:  Negative for polydipsia, polyuria, symptoms of hypoglycemia (dizziness, hunger, sweating) Hematologic:  Negative for anemia, purpura, petechia, prolonged or excessive bleeding, use of anticoagulants  Allergic:  Negative for difficulty breathing or choking as a result of exposure to anything; no shellfish allergy; no allergic response (rash/itch) to materials, foods  Physical exam: BP (!) 133/90   Pulse (!) 105   Ht 5\' 3"  (1.6 m)   Wt 200 lb (90.7 kg)   BMI 35.43 kg/m  GENERAL APPEARANCE:  Well appearing, well developed, well nourished, NAD HEENT: Atraumatic, Normocephalic, oropharynx clear. NECK: Supple without lymphadenopathy or thyromegaly. LUNGS: Clear to auscultation bilaterally. HEART: Regular Rate and Rhythm without murmurs, gallops, or rubs. ABDOMEN: Soft, non-tender, No Masses. EXTREMITIES: Moves all extremities well.  Without clubbing, cyanosis, or edema. NEUROLOGIC:  Alert and oriented x 3, normal gait, CN II-XII grossly intact.  MENTAL STATUS:  Appropriate. BACK:  Non-tender to palpation.  No CVAT SKIN:  Warm, dry and intact.    Results: U/A:  0-5 WBC, 0 RBC

## 2023-05-04 NOTE — Progress Notes (Signed)
Talked with patient. Instructions given. Arrival time 1215. NPO after MN. May take am meds with a sip of water on Monday morning.  Driver is secured. Hx and meds reviewed.

## 2023-05-07 ENCOUNTER — Ambulatory Visit (HOSPITAL_BASED_OUTPATIENT_CLINIC_OR_DEPARTMENT_OTHER)
Admission: RE | Admit: 2023-05-07 | Discharge: 2023-05-07 | Disposition: A | Discharge status: Home / Self Care | Payer: Commercial Managed Care - HMO | Attending: Urology | Admitting: Urology

## 2023-05-07 ENCOUNTER — Encounter (HOSPITAL_BASED_OUTPATIENT_CLINIC_OR_DEPARTMENT_OTHER)
Admission: RE | Disposition: A | Discharge status: Home / Self Care | Payer: Self-pay | Source: Home / Self Care | Attending: Urology

## 2023-05-07 ENCOUNTER — Encounter (HOSPITAL_BASED_OUTPATIENT_CLINIC_OR_DEPARTMENT_OTHER): Payer: Self-pay | Admitting: Urology

## 2023-05-07 ENCOUNTER — Ambulatory Visit (HOSPITAL_COMMUNITY): Payer: Commercial Managed Care - HMO

## 2023-05-07 DIAGNOSIS — E669 Obesity, unspecified: Secondary | ICD-10-CM | POA: Diagnosis not present

## 2023-05-07 DIAGNOSIS — N200 Calculus of kidney: Secondary | ICD-10-CM | POA: Insufficient documentation

## 2023-05-07 DIAGNOSIS — I10 Essential (primary) hypertension: Secondary | ICD-10-CM | POA: Insufficient documentation

## 2023-05-07 DIAGNOSIS — Z6835 Body mass index (BMI) 35.0-35.9, adult: Secondary | ICD-10-CM | POA: Diagnosis not present

## 2023-05-07 HISTORY — PX: EXTRACORPOREAL SHOCK WAVE LITHOTRIPSY: SHX1557

## 2023-05-07 LAB — POCT PREGNANCY, URINE: Preg Test, Ur: NEGATIVE

## 2023-05-07 SURGERY — EXTRACORPOREAL SHOCK WAVE LITHOTRIPSY (ESWL)
Anesthesia: LOCAL | Laterality: Right

## 2023-05-07 MED ORDER — DIPHENHYDRAMINE HCL 25 MG PO CAPS
ORAL_CAPSULE | ORAL | Status: AC
  Filled 2023-05-07: qty 1

## 2023-05-07 MED ORDER — DIAZEPAM 5 MG PO TABS
10.0000 mg | ORAL_TABLET | ORAL | Status: AC
  Administered 2023-05-07: 10 mg via ORAL

## 2023-05-07 MED ORDER — DIPHENHYDRAMINE HCL 25 MG PO CAPS
25.0000 mg | ORAL_CAPSULE | ORAL | Status: AC
  Administered 2023-05-07: 25 mg via ORAL

## 2023-05-07 MED ORDER — CIPROFLOXACIN HCL 500 MG PO TABS
500.0000 mg | ORAL_TABLET | ORAL | Status: AC
  Administered 2023-05-07: 500 mg via ORAL

## 2023-05-07 MED ORDER — OXYCODONE HCL 5 MG PO TABS: 5.0000 mg | ORAL_TABLET | Freq: Four times a day (QID) | ORAL | 0 refills | Status: DC | PRN

## 2023-05-07 MED ORDER — DIAZEPAM 5 MG PO TABS
ORAL_TABLET | ORAL | Status: AC
  Filled 2023-05-07: qty 2

## 2023-05-07 MED ORDER — SODIUM CHLORIDE 0.9 % IV SOLN: INTRAVENOUS | Status: DC

## 2023-05-07 MED ORDER — CIPROFLOXACIN HCL 500 MG PO TABS
ORAL_TABLET | ORAL | Status: AC
  Filled 2023-05-07: qty 1

## 2023-05-07 NOTE — Progress Notes (Signed)
Brought to Phase II PACU post Litho Treatment, tx from truck to [employer] area via wc, required assistance to ambulate to restroom post procedure, alert and oriented, speech WNL, able to follow commands without hesitation. Placed in recliner, POs offered. Nurse Call Light placed within easy reach. Position for comfort.

## 2023-05-07 NOTE — Interval H&P Note (Signed)
History and Physical Interval Note:  05/07/2023 1:40 PM  Anita Holland  has presented today for surgery, with the diagnosis of right neprholithiasis.  The various methods of treatment have been discussed with the patient and family. After consideration of risks, benefits and other options for treatment, the patient has consented to  Procedure(s): EXTRACORPOREAL SHOCK WAVE LITHOTRIPSY (ESWL) (Right) as a surgical intervention.  The patient's history has been reviewed, patient examined, no change in status, stable for surgery.  I have reviewed the patient's chart and labs.  Questions were answered to the patient's satisfaction.     Di Kindle

## 2023-05-07 NOTE — Discharge Instructions (Signed)
Rest for the next 24 hours Do Not Drive Eat a light dinner this evening Drink plenty of fluid Please use STRAINER and strain your urine, look for small stone like fragments, place in small cup provided and take with you to your follow up MD appt You may notice some light blood in your urine Monitor your temperature for fever and watch for signs and symptoms of infection Please read and follow the Discharge Instructions for ESL Patients Any questions or concern contact your Doctor at [phone]

## 2023-05-07 NOTE — Op Note (Signed)
See Operative Note scanned into chart.

## 2023-05-08 ENCOUNTER — Encounter (HOSPITAL_BASED_OUTPATIENT_CLINIC_OR_DEPARTMENT_OTHER): Payer: Self-pay | Admitting: Urology

## 2023-05-14 ENCOUNTER — Telehealth: Payer: Self-pay | Admitting: Nurse Practitioner

## 2023-05-14 NOTE — Telephone Encounter (Signed)
I called and spoke with patient and  she can not come at that time due to work. Patient said that she will be off work at 215pm. I told patient that I will let provider know and will be back in touch with her tomorrow, she said it was ok to contact her at number listed.

## 2023-05-14 NOTE — Telephone Encounter (Signed)
Caller Name: Virtie Miskiewicz Caller Ph #: [phone] Chief Complaint: pt woke this am with a high BP 152/102 heart rate 91 151/110 heart rate 104  Has headache for a couple days.  This call was transferred to Nurse Triage/Access Nurse. This is for documentation purposes. No follow up required at this time.

## 2023-05-15 ENCOUNTER — Encounter: Payer: Self-pay | Admitting: Internal Medicine

## 2023-05-15 ENCOUNTER — Ambulatory Visit: Payer: Managed Care, Other (non HMO) | Admitting: Internal Medicine

## 2023-05-15 VITALS — BP 140/92 | HR 95 | Temp 97.9°F | Ht 63.0 in | Wt 203.2 lb

## 2023-05-15 DIAGNOSIS — I10 Essential (primary) hypertension: Secondary | ICD-10-CM

## 2023-05-15 DIAGNOSIS — H9209 Otalgia, unspecified ear: Secondary | ICD-10-CM

## 2023-05-15 MED ORDER — LOSARTAN POTASSIUM-HCTZ 100-12.5 MG PO TABS: 1.0000 | ORAL_TABLET | Freq: Every day | ORAL | 0 refills | Status: DC

## 2023-05-15 NOTE — Progress Notes (Unsigned)
[facility] [address] [place] [address] Dept: [phone] Dept Fax: [phone]  Acute Care Office Visit  Subjective:   Anita Holland February 15, 1966 05/15/2023  Chief Complaint  Patient presents with   Hypertension    144/105 93 -11:58a    HPI: Discussed the use of AI scribe software for clinical note transcription with the patient, who gave verbal consent to proceed.  History of Present Illness   The patient, with a history of hypertension, presents with elevated blood pressure and headaches for the past two to three days. They deny chest pain, shortness of breath, dizziness, motor or sensory deficits, and slurred speech. They have been monitoring their salt intake due to their hypertension. Has taken tylenol for headache which did improve it.   The patient is currently on losartan-hydrochlorothiazide 50-12.5mg  daily for hypertension. They recently underwent a procedure for kidney stones. BP was elevated at that time as well (144/92, 131/93).   In addition to the hypertension and headaches, the patient also reports chronic ear pain, which has been ongoing for a while. The pain is severe enough to prevent them from wearing AirPods. They have been self-treating with peroxide on a Q-tip, but pain keeps coming back intermittently.      The following portions of the patient's history were reviewed and updated as appropriate: past medical history, past surgical history, family history, social history, allergies, medications, and problem list.   Patient Active Problem List   Diagnosis Date Noted   Nephrolithiasis 04/12/2023   Prediabetes 04/02/2023   Acute nonintractable headache 08/07/2022   Dysphagia 02/11/2022   Carpal tunnel syndrome of right wrist 02/11/2022   Primary hypertension 11/20/2021   IFG (impaired fasting glucose) 11/02/2021   Elevated LFTs 11/02/2021   Swelling of right foot 11/02/2021   Abnormal uterine  bleeding 09/28/2021   Vitamin D deficiency 08/17/2021   Vitamin B 12 deficiency 08/17/2021   Hot flashes 08/17/2021   Obesity (BMI 30-39.9) 08/17/2021   H. pylori infection 01/04/2018   History of gastric ulcer 01/04/2018   Iron deficiency anemia 01/04/2018   Abnormal Pap smear of cervix 05/08/2017   Impingement syndrome of left shoulder 04/02/2017   Trigger thumb, left thumb 04/02/2017   Spondylolysis, cervical region 03/19/2017   GERD (gastroesophageal reflux disease) 03/27/2016   Past Medical History:  Diagnosis Date   Allergy    Anemia    Arthritis    Blood transfusion without reported diagnosis    Fibroid    GERD (gastroesophageal reflux disease)    Hypertension    LGSIL of cervix of undetermined significance 06/2017   Few cells suggesting high-grade lesion.  Colposcopy showed inflammatory changes.  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  Recommend follow-up Pap smear 1 year   Peptic ulcer    Upper GI bleed 10/28/2017   Vaginal Pap smear, abnormal    Past Surgical History:  Procedure Laterality Date   DILATION AND CURETTAGE OF UTERUS     ESOPHAGOGASTRODUODENOSCOPY N/A 10/30/2017   Procedure: ESOPHAGOGASTRODUODENOSCOPY (EGD);  Surgeon: Napoleon Form, MD;  Location: [facility];  Service: Endoscopy;  Laterality: N/A;   EXTRACORPOREAL SHOCK WAVE LITHOTRIPSY Right 05/07/2023   Procedure: EXTRACORPOREAL SHOCK WAVE LITHOTRIPSY (ESWL);  Surgeon: Milderd Meager., MD;  Location: [facility];  Service: Urology;  Laterality: Right;   Family History  Problem Relation Age of Onset   Stroke Sister 56   Hypertension Sister    Depression Sister    Heart attack Sister 19  Colon cancer Neg Hx    Colon polyps Neg Hx    Esophageal cancer Neg Hx    Rectal cancer Neg Hx    Stomach cancer Neg Hx    Breast cancer Neg Hx     Current Outpatient Medications:    acetaminophen (TYLENOL) 500 MG tablet, Take by mouth., Disp: , Rfl:    gabapentin (NEURONTIN) 300 MG  capsule, Take 1 capsule (300 mg total) by mouth at bedtime. (Patient taking differently: Take 300 mg by mouth as needed.), Disp: 90 capsule, Rfl: 1   losartan-hydrochlorothiazide (HYZAAR) 100-12.5 MG tablet, Take 1 tablet by mouth daily., Disp: 30 tablet, Rfl: 0   megestrol (MEGACE) 40 MG tablet, Take 1 tablet (40 mg total) by mouth 2 (two) times daily. Can increase to two tablets twice a day in the event of heavy bleeding, Disp: 60 tablet, Rfl: 5   Multiple Vitamins-Minerals (CENTRUM FRESH/FRUITY 50+) CHEW, Chew 1 tablet by mouth daily., Disp: , Rfl:    ondansetron (ZOFRAN-ODT) 4 MG disintegrating tablet, Take 1 tablet (4 mg total) by mouth every 8 (eight) hours as needed., Disp: 20 tablet, Rfl: 0   oxyCODONE (OXY IR/ROXICODONE) 5 MG immediate release tablet, Take 1 tablet (5 mg total) by mouth every 6 (six) hours as needed for severe pain (pain score 7-10)., Disp: 15 tablet, Rfl: 0   pantoprazole (PROTONIX) 40 MG tablet, , Disp: , Rfl:    naproxen (NAPROSYN) 500 MG tablet, Take 1 tablet (500 mg total) by mouth 2 (two) times daily. (Patient taking differently: Take 500 mg by mouth as needed.), Disp: 30 tablet, Rfl: 0 No Known Allergies   ROS: A complete ROS was performed with pertinent positives/negatives noted in the HPI. The remainder of the ROS are negative.    Objective:   Today's Vitals   05/15/23 1456  BP: (!) 140/92  Pulse: 95  Temp: 97.9 F (36.6 C)  TempSrc: Temporal  SpO2: 97%  Weight: 203 lb 3.2 oz (92.2 kg)  Height: 5\' 3"  (1.6 m)    GENERAL: Well-appearing, in NAD. Well nourished.  SKIN: Pink, warm and dry. No rash, lesion, ulceration, or ecchymoses.  NECK: Trachea midline. Full ROM w/o pain or tenderness. No lymphadenopathy.  RESPIRATORY: Chest wall symmetrical. Respirations even and non-labored. Breath sounds clear to auscultation bilaterally.  CARDIAC: S1, S2 present, regular rate and rhythm. Peripheral pulses 2+ bilaterally.  MSK: Muscle tone and strength appropriate  for age. Joints w/o tenderness, redness, or swelling. EXTREMITIES: Without clubbing, cyanosis, or edema.  NEUROLOGIC: No motor or sensory deficits. Steady, even gait.  PSYCH/MENTAL STATUS: Alert, oriented x 3. Cooperative, appropriate mood and affect.    No results found for any visits on 05/15/23.    Assessment & Plan:  Assessment and Plan    Hypertension Elevated blood pressure readings at home and in clinic, associated with severe headaches. No other symptoms of hypertensive urgency/emergency. Currently on Losartan-Hydrochlorothiazide 50/12.5mg  daily. -Increase Losartan-Hydrochlorothiazide to 100/12.5mg  daily. -Check blood pressure at home and maintain a low salt diet. -Follow up in 2 weeks to reassess blood pressure control.  Chronic Ear Pain Reports of persistent ear pain, no signs of infection on examination. History of ear infection treated with antibiotics. -Continue current management. -If symptoms persist, consider referral to ENT for further evaluation.          Primary hypertension -     Losartan Potassium-HCTZ; Take 1 tablet by mouth daily.  Dispense: 30 tablet; Refill: 0   Meds ordered this encounter  Medications  losartan-hydrochlorothiazide (HYZAAR) 100-12.5 MG tablet    Sig: Take 1 tablet by mouth daily.    Dispense:  30 tablet    Refill:  0    Order Specific Question:   Supervising Provider    Answer:   Garnette Gunner [id number]   No orders of the defined types were placed in this encounter.  Lab Orders  No laboratory test(s) ordered today   No images are attached to the encounter or orders placed in the encounter.  Return in about 2 weeks (around 05/29/2023) for Blood Pressure re-check.   Salvatore Decent, FNP

## 2023-05-15 NOTE — Patient Instructions (Signed)
BLOOD PRESSURE: Obtain an automatic blood pressure machine if you do not have one.   Check your blood pressure at home, write down blood pressure readings and bring to next appointment.  Goal is BP less than 140/90 consistently.  Adhere to a low salt diet ( no more than 1500mg of salt/sodium per day) and exercise regularly   The nutrition facts label is a good place to find how much sodium is in foods. Look for products with no more than 400 mg of sodium per serving.  Remember that 1.5 g = 1500 mg.  The food label may also list foods as:  Sodium-free: Less than 5 mg in a serving.  Very low sodium: 35 mg or less in a serving.  Low-sodium: 140 mg or less in a serving.  Light in sodium: 50% less sodium in a serving. For example, if a food that usually has 300 mg of sodium is changed to become light in sodium, it will have 150 mg of sodium.  Reduced sodium: 25% less sodium in a serving. For example, if a food that usually has 400 mg of sodium is changed to reduced sodium, it will have 300 mg of sodium.   

## 2023-05-15 NOTE — Telephone Encounter (Signed)
I called and spoke with patient and scheduled her for a 2:40pm appointment today with Morrie Sheldon.

## 2023-05-21 ENCOUNTER — Other Ambulatory Visit: Payer: Self-pay

## 2023-05-21 ENCOUNTER — Encounter: Payer: Managed Care, Other (non HMO) | Admitting: Urology

## 2023-05-21 DIAGNOSIS — N200 Calculus of kidney: Secondary | ICD-10-CM

## 2023-05-21 NOTE — Progress Notes (Deleted)
Assessment: 1. Nephrolithiasis     Plan:   Chief Complaint:  No chief complaint on file.   History of Present Illness:  Anita Holland is a 57 y.o. female who is seen for further evaluation of nephrolithiasis.  She presented to the emergency room on 04/05/2023 with a 3-day history of right sided abdominal pain.  Urinalysis showed 21-50 RBCs, >50 WBCs.  Creatinine normal at 0.63. CT abdomen and pelvis with contrast from 04/05/23 done for right abdominal pain showed a 1.2 x 0.9 cm right renal calculus with slight fullness of right collecting system. She has not had any further abdominal or flank pain.  No dysuria or gross hematuria. KUB from 04/30/2023 showed a 12 mm calculus in the lower pole of the right kidney. Options were discussed with the patient.  She elected to proceed with shockwave lithotripsy. She underwent right shockwave lithotripsy on 05/07/2023.  Portions of the above documentation were copied from a prior visit for review purposes only.   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  Colposcopy showed inflammatory changes.  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  Recommend follow-up Pap smear 1 year   Peptic ulcer    Upper GI bleed 10/28/2017   Vaginal Pap smear, abnormal     Past Surgical History:  Past Surgical History:  Procedure Laterality Date   DILATION AND CURETTAGE OF UTERUS     ESOPHAGOGASTRODUODENOSCOPY N/A 10/30/2017   Procedure: ESOPHAGOGASTRODUODENOSCOPY (EGD);  Surgeon: Napoleon Form, MD;  Location: [facility];  Service: Endoscopy;  Laterality: N/A;   EXTRACORPOREAL SHOCK WAVE LITHOTRIPSY Right 05/07/2023   Procedure: EXTRACORPOREAL SHOCK WAVE LITHOTRIPSY (ESWL);  Surgeon:  Milderd Meager., MD;  Location: [facility];  Service: Urology;  Laterality: Right;    Allergies:  No Known Allergies  Family History:  Family History  Problem Relation Age of Onset   Stroke Sister 48   Hypertension Sister    Depression Sister    Heart attack Sister 33   Colon cancer Neg Hx    Colon polyps Neg Hx    Esophageal cancer Neg Hx    Rectal cancer Neg Hx    Stomach cancer Neg Hx    Breast cancer Neg Hx     Social History:  Social History   Tobacco Use   Smoking status: Never   Smokeless tobacco: Never  Vaping Use   Vaping status: Never Used  Substance Use Topics   Alcohol use: No   Drug use: No    ROS: Constitutional:  Negative for fever, chills, weight loss CV: Negative for chest pain, previous MI, hypertension Respiratory:  Negative for shortness of breath, wheezing, sleep apnea, frequent cough GI:  Negative for nausea, vomiting, bloody stool, GERD  Physical exam: There were no vitals taken for this visit. GENERAL APPEARANCE:  Well appearing, well developed, well nourished, NAD HEENT:  Atraumatic, normocephalic, oropharynx clear NECK:  Supple without lymphadenopathy or thyromegaly ABDOMEN:  Soft, non-tender, no masses EXTREMITIES:  Moves all extremities well, without clubbing, cyanosis, or edema NEUROLOGIC:  Alert and oriented x 3, normal gait, CN II-XII grossly intact MENTAL STATUS:  appropriate BACK:  Non-tender to palpation, No CVAT SKIN:  Warm, dry,  and intact  Results: U/A:

## 2023-05-31 ENCOUNTER — Ambulatory Visit (INDEPENDENT_AMBULATORY_CARE_PROVIDER_SITE_OTHER): Payer: Commercial Managed Care - HMO | Admitting: Nurse Practitioner

## 2023-05-31 ENCOUNTER — Encounter: Payer: Self-pay | Admitting: Nurse Practitioner

## 2023-05-31 VITALS — BP 124/84 | HR 101 | Temp 97.9°F | Ht 63.0 in | Wt 204.0 lb

## 2023-05-31 DIAGNOSIS — R7303 Prediabetes: Secondary | ICD-10-CM | POA: Diagnosis not present

## 2023-05-31 DIAGNOSIS — I10 Essential (primary) hypertension: Secondary | ICD-10-CM | POA: Diagnosis not present

## 2023-05-31 MED ORDER — PANTOPRAZOLE SODIUM 40 MG PO TBEC: 40.0000 mg | DELAYED_RELEASE_TABLET | Freq: Every day | ORAL | 3 refills | Status: DC

## 2023-05-31 MED ORDER — LOSARTAN POTASSIUM-HCTZ 100-12.5 MG PO TABS: 1.0000 | ORAL_TABLET | Freq: Every day | ORAL | 1 refills | Status: DC

## 2023-05-31 NOTE — Assessment & Plan Note (Signed)
Chronic, stable. Check A1c today and treat based on results.

## 2023-05-31 NOTE — Assessment & Plan Note (Addendum)
Chronic, stable. Blood pressure has improved since the last visit, though she experiences intermittent headaches, possibly related to blood pressure fluctuations. She has been taking Tylenol for relief. We will continue losartan-hctz 100-12.5mg  daily, check blood pressure regularly, and monitor for headache frequency and severity. Check BMP and lipid panel today.

## 2023-05-31 NOTE — Progress Notes (Signed)
Established Patient Office Visit  Subjective   Patient ID: Anita Holland, female    DOB: Jan 15, 1966  Age: 57 y.o. MRN: [id number]  Chief Complaint  Patient presents with   Hypertension    Follow up and post surgery follow up, Rx refill    HPI  Discussed the use of AI scribe software for clinical note transcription with the patient, who gave verbal consent to proceed.  History of Present Illness   The patient, with a history of hypertension, recently underwent lithotripsy. She reports doing well since the surgery, and is not having any pain or blood in her urine. She reports experiencing headaches, which she initially attributed to fluctuations in her blood pressure. 2 weeks ago, her losartan-hctz was increased to 100-12.5mg  daily. She states the headaches have gotten better and occurred when she forgot to take her medication. She did take 2 tylenol and then headache was relieved. She denies chest pain and shortness of breath.        ROS See pertinent positives and negatives per HPI.    Objective:     BP 124/84 (BP Location: Left Arm)   Pulse (!) 101   Temp 97.9 F (36.6 C)   Ht 5\' 3"  (1.6 m)   Wt 204 lb (92.5 kg)   SpO2 96%   BMI 36.14 kg/m  BP Readings from Last 3 Encounters:  05/31/23 124/84  05/15/23 (!) 140/92  05/07/23 (!) 131/93   Wt Readings from Last 3 Encounters:  05/31/23 204 lb (92.5 kg)  05/15/23 203 lb 3.2 oz (92.2 kg)  05/07/23 202 lb 3.2 oz (91.7 kg)      Physical Exam Vitals and nursing note reviewed.  Constitutional:      General: She is not in acute distress.    Appearance: Normal appearance.  HENT:     Head: Normocephalic.  Eyes:     Conjunctiva/sclera: Conjunctivae normal.  Cardiovascular:     Rate and Rhythm: Normal rate and regular rhythm.     Pulses: Normal pulses.     Heart sounds: Normal heart sounds.  Pulmonary:     Effort: Pulmonary effort is normal.     Breath sounds: Normal breath sounds.  Musculoskeletal:     Cervical  back: Normal range of motion.  Skin:    General: Skin is warm.  Neurological:     General: No focal deficit present.     Mental Status: She is alert and oriented to person, place, and time.  Psychiatric:        Mood and Affect: Mood normal.        Behavior: Behavior normal.        Thought Content: Thought content normal.        Judgment: Judgment normal.      No results found for any visits on 05/31/23.    The 10-year ASCVD risk score (Arnett DK, et al., 2019) is: 5.3%    Assessment & Plan:   Problem List Items Addressed This Visit       Cardiovascular and Mediastinum   Primary hypertension - Primary    Chronic, stable. Blood pressure has improved since the last visit, though she experiences intermittent headaches, possibly related to blood pressure fluctuations. She has been taking Tylenol for relief. We will continue losartan-hctz 100-12.5mg  daily, check blood pressure regularly, and monitor for headache frequency and severity. Check BMP and lipid panel today.       Relevant Medications   losartan-hydrochlorothiazide (HYZAAR) 100-12.5 MG tablet   Other  Relevant Orders   Basic metabolic panel   Lipid panel     Other   Prediabetes    Chronic, stable. Check A1c today and treat based on results.       Relevant Orders   Hemoglobin A1c    Return in about 6 months (around 11/29/2023) for CPE.    Gerre Scull, NP

## 2023-05-31 NOTE — Patient Instructions (Signed)
It was great to see you!  We are checking your labs today and will let you know the results via mychart/phone.   Keep taking the blood pressure medication  Let's follow-up in 6 months, sooner if you have concerns.  If a referral was placed today, you will be contacted for an appointment. Please note that routine referrals can sometimes take up to 3-4 weeks to process. Please call our office if you haven't heard anything after this time frame.  Take care,  Rodman Pickle, NP

## 2023-06-01 ENCOUNTER — Encounter: Payer: Managed Care, Other (non HMO) | Admitting: Urology

## 2023-06-01 LAB — LIPID PANEL
Cholesterol: 178 mg/dL (ref 0–200)
HDL: 36.1 mg/dL — ABNORMAL LOW (ref >39.00)
LDL Cholesterol: 92 mg/dL (ref 0–99)
NonHDL: 142.2
Total CHOL/HDL Ratio: 5
Triglycerides: 249 mg/dL — ABNORMAL HIGH (ref 0.0–149.0)
VLDL: 49.8 mg/dL — ABNORMAL HIGH (ref 0.0–40.0)

## 2023-06-01 LAB — BASIC METABOLIC PANEL
BUN: 11 mg/dL (ref 6–23)
CO2: 26 meq/L (ref 19–32)
Calcium: 9.7 mg/dL (ref 8.4–10.5)
Chloride: 104 meq/L (ref 96–112)
Creatinine, Ser: 0.72 mg/dL (ref 0.40–1.20)
GFR: 93.53 mL/min (ref >60.00)
Glucose, Bld: 115 mg/dL — ABNORMAL HIGH (ref 70–99)
Potassium: 4.1 meq/L (ref 3.5–5.1)
Sodium: 140 meq/L (ref 135–145)

## 2023-06-01 LAB — HEMOGLOBIN A1C: Hgb A1c MFr Bld: 6 % (ref 4.6–6.5)

## 2023-06-01 NOTE — Progress Notes (Unsigned)
Assessment: 1. Nephrolithiasis     Plan: KUB from today reviewed.  Chief Complaint:  No chief complaint on file.   History of Present Illness:  Anita Holland is a 57 y.o. female who is seen for further evaluation of nephrolithiasis.  She presented to the emergency room on 04/05/2023 with a 3-day history of right sided abdominal pain.  Urinalysis showed 21-50 RBCs, >50 WBCs.  Creatinine normal at 0.63. CT abdomen and pelvis with contrast from 04/05/23 done for right abdominal pain showed a 1.2 x 0.9 cm right renal calculus with slight fullness of right collecting system. She has not had any further abdominal or flank pain.  No dysuria or gross hematuria. KUB from 04/30/2023 showed a 12 mm calculus in the lower pole of the right kidney. Options were discussed with the patient.  She elected to proceed with shockwave lithotripsy. She underwent right shockwave lithotripsy on 05/07/2023.  Portions of the above documentation were copied from a prior visit for review purposes only.   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  Colposcopy showed inflammatory changes.  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  Recommend follow-up Pap smear 1 year   Peptic ulcer    Upper GI bleed 10/28/2017   Vaginal Pap smear, abnormal     Past Surgical History:  Past Surgical History:  Procedure Laterality Date   DILATION AND CURETTAGE OF UTERUS     ESOPHAGOGASTRODUODENOSCOPY N/A 10/30/2017   Procedure: ESOPHAGOGASTRODUODENOSCOPY (EGD);  Surgeon: Napoleon Form, MD;  Location: [facility];  Service: Endoscopy;  Laterality: N/A;   EXTRACORPOREAL SHOCK WAVE LITHOTRIPSY Right 05/07/2023   Procedure: EXTRACORPOREAL SHOCK WAVE LITHOTRIPSY  (ESWL);  Surgeon: Milderd Meager., MD;  Location: [facility];  Service: Urology;  Laterality: Right;    Allergies:  No Known Allergies  Family History:  Family History  Problem Relation Age of Onset   Stroke Sister 19   Hypertension Sister    Depression Sister    Heart attack Sister 80   Colon cancer Neg Hx    Colon polyps Neg Hx    Esophageal cancer Neg Hx    Rectal cancer Neg Hx    Stomach cancer Neg Hx    Breast cancer Neg Hx     Social History:  Social History   Tobacco Use   Smoking status: Never   Smokeless tobacco: Never  Vaping Use   Vaping status: Never Used  Substance Use Topics   Alcohol use: No   Drug use: No    ROS: Constitutional:  Negative for fever, chills, weight loss CV: Negative for chest pain, previous MI, hypertension Respiratory:  Negative for shortness of breath, wheezing, sleep apnea, frequent cough GI:  Negative for nausea, vomiting, bloody stool, GERD  Physical exam: There were no vitals taken for this visit. GENERAL APPEARANCE:  Well appearing, well developed, well nourished, NAD HEENT:  Atraumatic, normocephalic, oropharynx clear NECK:  Supple without lymphadenopathy or thyromegaly ABDOMEN:  Soft, non-tender, no masses EXTREMITIES:  Moves all extremities well, without clubbing, cyanosis, or edema NEUROLOGIC:  Alert and oriented x 3, normal gait, CN II-XII grossly intact MENTAL STATUS:  appropriate BACK:  Non-tender to palpation, No CVAT SKIN:  Warm, dry, and intact  Results: U/A:

## 2023-06-28 ENCOUNTER — Encounter: Payer: Self-pay | Admitting: Obstetrics and Gynecology

## 2023-06-28 ENCOUNTER — Ambulatory Visit (INDEPENDENT_AMBULATORY_CARE_PROVIDER_SITE_OTHER): Payer: Commercial Managed Care - HMO | Admitting: Obstetrics and Gynecology

## 2023-06-28 VITALS — BP 155/102 | HR 95 | Wt 204.2 lb

## 2023-06-28 DIAGNOSIS — Z0000 Encounter for general adult medical examination without abnormal findings: Secondary | ICD-10-CM

## 2023-06-28 DIAGNOSIS — N939 Abnormal uterine and vaginal bleeding, unspecified: Secondary | ICD-10-CM

## 2023-06-28 DIAGNOSIS — Z01419 Encounter for gynecological examination (general) (routine) without abnormal findings: Secondary | ICD-10-CM

## 2023-06-28 NOTE — Progress Notes (Signed)
   WELL-WOMAN PHYSICAL  Patient name: Anita Holland MRN [id number]  Date of birth: 02-04-1966 Chief Complaint:   Annual Exam  History of Present Illness:   Anita Holland is a 58 y.o. [phone] African American female being seen today for a routine well-woman exam.  Current complaints: irregular vaginal bleeding. She wants the VB to stop. She has Megace  and takes prn. She reports having a large fibroid removed in 2023, but several fibroids were left behind.   PCP: Anita Harada, NP      does not desire labs No LMP recorded (lmp unknown). The current method of family planning is condoms. She is occasionally sexually active. Last pap 08/17/2021. Results were: normal Last mammogram: 11/2022. Results were: normal. Family h/o breast cancer: No Last colonoscopy: 05/07/2023. Results were: normal. Family h/o colorectal cancer: No Review of Systems:   Pertinent items are noted in HPI Denies any headaches, blurred vision, fatigue, shortness of breath, chest pain, abdominal pain, abnormal vaginal discharge/itching/odor/irritation, problems with periods, bowel movements, urination, or intercourse unless otherwise stated above. Pertinent History Reviewed:  Reviewed past medical,surgical, social and family history.  Reviewed problem list, medications and allergies. Physical Assessment:   Vitals:   06/28/23 1447  BP: (!) 155/102  Pulse: 95  Weight: 204 lb 3.2 oz (92.6 kg)  Body mass index is 36.17 kg/m.        Physical Examination:   General appearance - well appearing, and in no distress  Mental status - alert, oriented to person, place, and time  Psych:  She has a normal mood and affect  Skin - warm and dry, normal color, no suspicious lesions noted  Chest - effort normal, all lung fields clear to auscultation bilaterally  Heart - normal rate and regular rhythm  Neck:  midline trachea, no thyromegaly or nodules  Breasts - breasts appear normal, no suspicious masses, no skin or nipple changes  or  axillary nodes  Abdomen - soft, nontender, nondistended, no masses or organomegaly  Pelvic - deferred  Thin prep pap is not done   Extremities:  No swelling or varicosities noted  No results found for this or any previous visit (from the past 24 hours).  Assessment & Plan:  1) Encounter for well woman exam without gynecological exam (Primary)  2) Abnormal uterine bleeding - Advised to continue taking Megace  for AUB - US  PELVIC COMPLETE WITH TRANSVAGINAL; Future  - F/U with one of our FEMALE MDs after pelvic U/S to discuss results and plan of care to manage AUB  Labs/procedures today: none  Mammogram as scheduled or sooner if problems Colonoscopy as scheduled or sooner if problems  Orders Placed This Encounter  Procedures   US  PELVIC COMPLETE WITH TRANSVAGINAL    Meds: No orders of the defined types were placed in this encounter.   Follow-up: Return in about 4 weeks (around 07/26/2023) for F/U after pelvi U/S with MD ONLY.  Total face-to-face time spent during this encounter was 10 minutes. There was 5 minutes of chart review time spent prior to this encounter. Total time spent = 15 minutes.    Anita Salmon MSN, CNM 06/28/2023 3:54 PM

## 2023-06-28 NOTE — Progress Notes (Signed)
 Pt. Presents for annual. Pap was done on 08/17/2021. Pt. Does not want any std testing. Pt. Is having irregular bleeding and would like something done about it.

## 2023-06-29 ENCOUNTER — Ambulatory Visit (INDEPENDENT_AMBULATORY_CARE_PROVIDER_SITE_OTHER): Payer: Commercial Managed Care - HMO | Admitting: Urology

## 2023-06-29 ENCOUNTER — Ambulatory Visit (HOSPITAL_BASED_OUTPATIENT_CLINIC_OR_DEPARTMENT_OTHER)
Admission: RE | Admit: 2023-06-29 | Discharge: 2023-06-29 | Disposition: A | Discharge status: Home / Self Care | Payer: Commercial Managed Care - HMO | Source: Ambulatory Visit | Attending: Urology | Admitting: Urology

## 2023-06-29 ENCOUNTER — Encounter: Payer: Self-pay | Admitting: Urology

## 2023-06-29 VITALS — BP 164/108 | HR 94 | Ht 63.0 in | Wt 202.0 lb

## 2023-06-29 DIAGNOSIS — N200 Calculus of kidney: Secondary | ICD-10-CM

## 2023-06-29 DIAGNOSIS — Z09 Encounter for follow-up examination after completed treatment for conditions other than malignant neoplasm: Secondary | ICD-10-CM

## 2023-06-29 LAB — URINALYSIS, ROUTINE W REFLEX MICROSCOPIC
Bilirubin, UA: NEGATIVE
Glucose, UA: NEGATIVE
Ketones, UA: NEGATIVE
Nitrite, UA: NEGATIVE
Protein,UA: NEGATIVE
Specific Gravity, UA: 1.02 (ref 1.005–1.030)
Urobilinogen, Ur: 0.2 mg/dL (ref 0.2–1.0)
pH, UA: 6 (ref 5.0–7.5)

## 2023-06-29 LAB — MICROSCOPIC EXAMINATION

## 2023-06-29 NOTE — Progress Notes (Signed)
 Assessment: 1. Nephrolithiasis     Plan: KUB from today reviewed.  There is a cluster of calcifications measuring approximately 10 mm in size at the lower aspect of the right renal shadow. Schedule for renal ultrasound Stone prevention discussed. Will contact her with results of ultrasound.  Chief Complaint:  Chief Complaint  Patient presents with   Nephrolithiasis    History of Present Illness:  Anita Holland is a 58 y.o. female who is seen for further evaluation of nephrolithiasis.  She presented to the emergency room on 04/05/2023 with a 3-day history of right sided abdominal pain.  Urinalysis showed 21-50 RBCs, >50 WBCs.  Creatinine normal at 0.63. CT abdomen and pelvis with contrast from 04/05/23 done for right abdominal pain showed a 1.2 x 0.9 cm right renal calculus with slight fullness of right collecting system. She has not had any further abdominal or flank pain.  No dysuria or gross hematuria. KUB from 04/30/2023 showed a 12 mm calculus in the lower pole of the right kidney. Options were discussed with the patient.  She elected to proceed with shockwave lithotripsy. She underwent right shockwave lithotripsy on 05/07/2023.  She returns today for follow-up.  She reports passing some small fragments following the lithotripsy treatment.  She has not passed any fragments for some time.  She has had several episodes of right sided flank pain which are short in duration.  No dysuria or gross hematuria.  Portions of the above documentation were copied from a prior visit for review purposes only.   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  Colposcopy showed inflammatory changes.  Biopsy showed LGSIL negative  ECC.  Recommend follow-up Pap smear 1 year   Peptic ulcer    Upper GI bleed 10/28/2017   Vaginal Pap smear, abnormal     Past Surgical History:  Past Surgical History:  Procedure Laterality Date   DILATION AND CURETTAGE OF UTERUS     ESOPHAGOGASTRODUODENOSCOPY N/A 10/30/2017   Procedure: ESOPHAGOGASTRODUODENOSCOPY (EGD);  Surgeon: Nandigam, Kavitha V, MD;  Location: [facility];  Service: Endoscopy;  Laterality: N/A;   EXTRACORPOREAL SHOCK WAVE LITHOTRIPSY Right 05/07/2023   Procedure: EXTRACORPOREAL SHOCK WAVE LITHOTRIPSY (ESWL);  Surgeon: Roseann Adine PARAS., MD;  Location: [facility];  Service: Urology;  Laterality: Right;    Allergies:  No Known Allergies  Family History:  Family History  Problem Relation Age of Onset   Stroke Sister 52   Hypertension Sister    Depression Sister    Heart attack Sister 23   Colon cancer Neg Hx    Colon polyps Neg Hx    Esophageal cancer Neg Hx    Rectal cancer Neg Hx    Stomach cancer Neg Hx    Breast cancer Neg Hx     Social History:  Social History   Tobacco Use   Smoking status: Never   Smokeless tobacco: Never  Vaping Use   Vaping status: Never Used  Substance Use Topics   Alcohol use: No   Drug use: No    ROS: Constitutional:  Negative for fever, chills, weight loss CV: Negative for chest pain, previous MI, hypertension Respiratory:  Negative for shortness of breath,  wheezing, sleep apnea, frequent cough GI:  Negative for nausea, vomiting, bloody stool, GERD  Physical exam: BP (!) 164/108   Pulse 94   Ht 5' 3 (1.6 m)   Wt 202 lb (91.6 kg)   LMP  (LMP Unknown)   BMI 35.78 kg/m  GENERAL APPEARANCE:  Well appearing, well developed, well nourished, NAD HEENT:  Atraumatic, normocephalic, oropharynx clear NECK:  Supple without lymphadenopathy or thyromegaly ABDOMEN:  Soft, non-tender, no masses EXTREMITIES:  Moves all extremities well, without clubbing, cyanosis, or edema NEUROLOGIC:  Alert and  oriented x 3, normal gait, CN II-XII grossly intact MENTAL STATUS:  appropriate BACK:  Non-tender to palpation, No CVAT SKIN:  Warm, dry, and intact  Results: U/A:  0-5 WBC, 0-2 RBC

## 2023-07-01 ENCOUNTER — Encounter: Payer: Self-pay | Admitting: Obstetrics and Gynecology

## 2023-07-04 ENCOUNTER — Ambulatory Visit: Payer: Commercial Managed Care - HMO | Admitting: Family Medicine

## 2023-07-05 ENCOUNTER — Ambulatory Visit (HOSPITAL_BASED_OUTPATIENT_CLINIC_OR_DEPARTMENT_OTHER)
Admission: RE | Admit: 2023-07-05 | Discharge: 2023-07-05 | Disposition: A | Discharge status: Home / Self Care | Payer: Commercial Managed Care - HMO | Source: Ambulatory Visit | Attending: Urology | Admitting: Urology

## 2023-07-05 DIAGNOSIS — N200 Calculus of kidney: Secondary | ICD-10-CM | POA: Diagnosis present

## 2023-07-09 ENCOUNTER — Telehealth: Payer: Self-pay | Admitting: Urology

## 2023-07-09 ENCOUNTER — Encounter: Payer: Self-pay | Admitting: Urology

## 2023-07-09 NOTE — Telephone Encounter (Signed)
 Pt confused about mychart message that was sent to her today from Dr Pete Glatter.Would like a phone call explaining her results. Pt did sch the 6 month follow up appt.

## 2023-07-12 ENCOUNTER — Ambulatory Visit (HOSPITAL_BASED_OUTPATIENT_CLINIC_OR_DEPARTMENT_OTHER)
Admission: RE | Admit: 2023-07-12 | Discharge: 2023-07-12 | Disposition: A | Discharge status: Home / Self Care | Payer: Commercial Managed Care - HMO | Source: Ambulatory Visit | Attending: Obstetrics and Gynecology | Admitting: Obstetrics and Gynecology

## 2023-07-12 DIAGNOSIS — N939 Abnormal uterine and vaginal bleeding, unspecified: Secondary | ICD-10-CM | POA: Insufficient documentation

## 2023-07-18 ENCOUNTER — Ambulatory Visit: Payer: Commercial Managed Care - HMO | Admitting: Family Medicine

## 2023-07-21 ENCOUNTER — Other Ambulatory Visit: Payer: Self-pay | Admitting: Nurse Practitioner

## 2023-07-23 ENCOUNTER — Ambulatory Visit: Payer: Self-pay | Admitting: Nurse Practitioner

## 2023-07-23 NOTE — Telephone Encounter (Signed)
Chief Complaint: Left arm pain and bilateral hand swelling at night Symptoms: Left arm pain, bilateral hand swelling Frequency: 3 weeks Pertinent Negatives: Patient denies Chest pain, difficulty breathing Disposition: [] ED /[] Urgent Care (no appt availability in office) / [x] Appointment(In office/virtual)/ []  [facility]/ [] Home Care/ [] Refused Recommended Disposition /[] [facility]/ []  Follow-up with PCP Additional Notes: Patient called in stating she has been experiencing left arm pain and bilateral hand swelling at night for 3 weeks. Patient states if she is up moving around, the pain is not as severe but at night the pain is worse, along with bilateral hand swelling. Patient is concerned this could be contributed to Amlodipine because she had feet swelling in the past. Patient states the arm pain is getting worse to where she is having trouble dressing herself. Patient states a hot shower helps. Created patient appt asap for tomorrow in office.   Copied from CRM [id number]. Topic: Clinical - Red Word Triage >> Jul 23, 2023 11:23 AM Anita Holland wrote: Red Word that prompted transfer to Nurse Triage: Swelling of hands and feet, lots of pain in left arm. Can't hardly use arm. Been about 2 weeks now and getting worse. Feels its due to the medication change with losartan-hydrochlorothiazide (HYZAAR) 100-12.5 MG tablet. Pain 10/10 mostly. Hurts to dress self. Reason for Disposition  MODERATE arm swelling (e.g., puffiness or swollen feeling of entire arm)  Answer Assessment - Initial Assessment Questions 1. ONSET: "When did the swelling start?" (e.g., minutes, hours, days)     3 weeks, but worsening 2. LOCATION: "What part of the arm is swollen?"  "Are both arms swollen or just one arm?"     Both hands swelling at night, left arm pain 3. SEVERITY: "How bad is the swelling?" (e.g., localized; mild, moderate, severe)   - LOCALIZED: Small area of puffiness or swelling on just one  arm   - JOINT SWELLING: Swelling of one joint   - MILD: Puffiness or swelling of hand   - MODERATE: Puffiness or swollen feeling of entire arm    - SEVERE: All of arm looks swollen; pitting edema     Really bad 4. REDNESS: "Does the swelling look red or infected?"     No 5. PAIN: "Is the swelling painful to touch?" If Yes, ask: "How painful is it?"   (Scale 1-10; mild, moderate or severe)     10/10 at night... if moving around during day it is 8/10 6. FEVER: "Do you have a fever?" If Yes, ask: "What is it, how was it measured, and when did it start?"      No 7. CAUSE: "What do you think is causing the arm swelling?"     No I have no idea, I know when I was amlodopine my feet swelled at night 8. MEDICAL HISTORY: "Do you have a history of heart failure, kidney disease, liver failure, or cancer?"     No 9. RECURRENT SYMPTOM: "Have you had arm swelling before?" If Yes, ask: "When was the last time?" "What happened that time?"     No 10. OTHER SYMPTOMS: "Do you have any other symptoms?" (e.g., chest pain, difficulty breathing)       Left arm pain  Protocols used: Arm Swelling and Edema-A-AH

## 2023-07-24 ENCOUNTER — Ambulatory Visit: Payer: Commercial Managed Care - HMO | Admitting: Family Medicine

## 2023-07-24 ENCOUNTER — Ambulatory Visit (INDEPENDENT_AMBULATORY_CARE_PROVIDER_SITE_OTHER)
Admission: RE | Admit: 2023-07-24 | Discharge: 2023-07-24 | Disposition: A | Discharge status: Home / Self Care | Payer: Commercial Managed Care - HMO | Source: Ambulatory Visit | Attending: Family Medicine | Admitting: Family Medicine

## 2023-07-24 VITALS — BP 126/86 | HR 79 | Temp 97.9°F | Wt 204.8 lb

## 2023-07-24 DIAGNOSIS — M7542 Impingement syndrome of left shoulder: Secondary | ICD-10-CM

## 2023-07-24 DIAGNOSIS — K279 Peptic ulcer, site unspecified, unspecified as acute or chronic, without hemorrhage or perforation: Secondary | ICD-10-CM

## 2023-07-24 DIAGNOSIS — I10 Essential (primary) hypertension: Secondary | ICD-10-CM | POA: Diagnosis not present

## 2023-07-24 MED ORDER — LOSARTAN POTASSIUM-HCTZ 100-12.5 MG PO TABS
1.0000 | ORAL_TABLET | Freq: Every day | ORAL | 3 refills | Status: DC
Start: 2023-07-24 — End: 2023-11-29

## 2023-07-24 MED ORDER — PANTOPRAZOLE SODIUM 40 MG PO TBEC
40.0000 mg | DELAYED_RELEASE_TABLET | Freq: Every day | ORAL | 3 refills | Status: AC
Start: 2023-07-24 — End: 2024-07-18

## 2023-07-24 MED ORDER — CELECOXIB 200 MG PO CAPS: 200.0000 mg | ORAL_CAPSULE | Freq: Two times a day (BID) | ORAL | 0 refills | Status: DC | PRN

## 2023-07-24 MED ORDER — PANTOPRAZOLE SODIUM 40 MG PO TBEC: 40.0000 mg | DELAYED_RELEASE_TABLET | Freq: Every day | ORAL | 3 refills | Status: DC

## 2023-07-24 NOTE — Addendum Note (Signed)
Addended by: Garnette Gunner on: 07/24/2023 03:25 PM   Modules accepted: Orders

## 2023-07-24 NOTE — Patient Instructions (Addendum)
For shoulder xray, go to:    [place] [facility] [address], [place], [place] [address] Phone: [phone]  VISIT SUMMARY:  You came in today because of left arm pain and hand swelling that has been getting worse over the past three weeks, especially at night. You also mentioned that your right hand started swelling about a week ago. We discussed your history of left shoulder impingement and previous treatments. Additionally, we reviewed your current medications and blood pressure readings.  YOUR PLAN:  -LEFT SHOULDER PAIN: Left shoulder pain and swelling can be due to musculoskeletal issues, possibly related to your previous shoulder impingement. We will do an x-ray to check for any changes since your last imaging in 2018. You have been prescribed celecoxib 200 mg twice daily as needed for pain, and I have provided you with shoulder exercises to help with range of motion and pain relief. Please follow up in one month to reassess your shoulder pain and review the x-ray results.  -HYPERTENSION: Hypertension, or high blood pressure, is being managed with losartan and hydrochlorothiazide. Your blood pressure readings today were slightly elevated, possibly due to missing your medication. It is important to monitor your blood pressure at home and ensure you take your medication as prescribed. Your prescription for losartan and hydrochlorothiazide has been refilled. Please follow up in one month to reassess your blood pressure.  -ULCER DISEASE: Ulcer disease is being managed with pantoprazole, and you have not had any recent gastrointestinal pain or discomfort. Your prescription for pantoprazole has been refilled. If you experience any recurring symptoms, please consider following up with a gastroenterologist.  INSTRUCTIONS:  Please follow up in one month to reassess your shoulder pain and review the x-ray results, as well as to reassess your blood pressure. Continue to monitor your blood  pressure at home and ensure you take your medications as prescribed.

## 2023-07-24 NOTE — Progress Notes (Addendum)
Assessment/Plan:      Left Shoulder Pain Left shoulder pain and swelling for three weeks, primarily nocturnal, with significant tenderness and limited range of motion. Likely musculoskeletal in origin, possibly related to a previous impingement. Differential includes musculoskeletal injury versus exacerbation of previous impingement. Discussed celecoxib for pain relief, which is less likely to cause GI ulcers compared to ibuprofen. Patient declined Toradol injection. X-ray will help identify any changes since the last imaging in 2018. - Order left shoulder x-ray - Prescribe celecoxib 200 mg twice daily as needed for pain - Provide shoulder exercises for range of motion and pain relief - Follow up in one month to reassess shoulder pain and review x-ray results  Hypertension Hypertension, currently on losartan 100 mg and hydrochlorothiazide 12.5 mg. Blood pressure today was 148/86, with a home reading of 126/93. Patient missed today's dose due to prescription refill issues. Discussed the importance of monitoring blood pressure at home and ensuring medication adherence. - Refill losartan 100 mg and hydrochlorothiazide 12.5 mg - Monitor blood pressure at home - Follow up in one month to reassess blood pressure  Ulcer Disease Ulcer disease, managed with pantoprazole 40 mg. No recent GI pain or discomfort. Last gastroenterology visit was a long time ago. Discussed the need for follow-up with gastroenterology if symptoms recur. - Refill pantoprazole 40 mg - Consider follow-up with gastroenterology if symptoms recur.        Medications Discontinued During This Encounter  Medication Reason   ondansetron (ZOFRAN-ODT) 4 MG disintegrating tablet    naproxen (NAPROSYN) 500 MG tablet    pantoprazole (PROTONIX) 40 MG tablet Reorder    Return in about 1 month (around 08/24/2023), or if symptoms worsen or fail to improve, for BP, left shoulder pain .    Subjective:   Encounter date:  07/24/2023  Anita Holland is a 58 y.o. female who has GERD (gastroesophageal reflux disease); Spondylolysis, cervical region; Impingement syndrome of left shoulder; Trigger thumb, left thumb; Abnormal Pap smear of cervix; H. pylori infection; History of gastric ulcer; Iron deficiency anemia; Vitamin D deficiency; Vitamin B 12 deficiency; Hot flashes; Obesity (BMI 30-39.9); Abnormal uterine bleeding; IFG (impaired fasting glucose); Elevated LFTs; Swelling of right foot; Primary hypertension; Dysphagia; Carpal tunnel syndrome of right wrist; Acute nonintractable headache; Prediabetes; and Nephrolithiasis on their problem list..   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   She presents with chief complaint of Medical Management of Chronic Issues (Left arm pain, hand swelling after changing b/p medication. Pantoprazole rx refill) .   Discussed the use of AI scribe software for clinical note transcription with the patient, who gave verbal consent to proceed.  History of Present Illness   The patient presents with left arm pain and hand swelling.  She has been experiencing left arm pain and hand swelling for approximately three weeks, with symptoms worsening at night. Initially, she suspected a slip or exercise-related injury, but the symptoms have intensified, particularly at night, causing significant swelling in the left hand. She avoids sleeping on the affected side. The right hand began swelling about a week ago. The pain is primarily in the left shoulder, causing significant discomfort and impacting her ability to use the arm at night and dress in the morning. No chest pain or shortness of breath during this timeframe.  She  has a history of  left shoulder impingement and received cortisone shots in the past, which were ineffective. She describes the current pain as worse than previous episodes.  She has a history of swelling in her feet when she was on amlodipine, which led to a change in her blood pressure medication. Currently, she is on losartan 100 mg and hydrochlorothiazide 12.5 mg. She has not taken her blood pressure medication today due to a refill issue. Her blood pressure was 126/93 at home earlier today.  She also takes pantoprazole 40 mg for ulcer disease and megestrol for heavy menstrual bleeding. She has not seen a gastroenterologist recently and reports no gastrointestinal pain or discomfort.       Review of Systems  All other systems reviewed and are negative.   Past Surgical History:  Procedure Laterality Date   DILATION AND CURETTAGE OF UTERUS     ESOPHAGOGASTRODUODENOSCOPY N/A 10/30/2017   Procedure: ESOPHAGOGASTRODUODENOSCOPY (EGD);  Surgeon: Napoleon Form, MD;  Location: [facility];  Service: Endoscopy;  Laterality: N/A;   EXTRACORPOREAL SHOCK WAVE LITHOTRIPSY Right 05/07/2023   Procedure: EXTRACORPOREAL SHOCK WAVE LITHOTRIPSY (ESWL);  Surgeon: Milderd Meager., MD;  Location: [facility];  Service: Urology;  Laterality: Right;    Outpatient Medications Prior to Visit  Medication Sig Dispense Refill   acetaminophen (TYLENOL) 500 MG tablet Take by mouth.     gabapentin (NEURONTIN) 300 MG capsule Take 1 capsule (300 mg total) by mouth at bedtime. (Patient taking differently: Take 300 mg by mouth as needed.) 90 capsule 1   losartan-hydrochlorothiazide (HYZAAR) 100-12.5 MG tablet Take 1 tablet by mouth daily. 90 tablet 1   megestrol (MEGACE) 40 MG tablet Take 1 tablet (40 mg total) by mouth 2 (two) times daily. Can increase to two tablets twice a day in the event of heavy bleeding 60 tablet 5   Multiple Vitamins-Minerals (CENTRUM FRESH/FRUITY 50+) CHEW Chew 1 tablet by mouth daily.      oxyCODONE (OXY IR/ROXICODONE) 5 MG immediate release tablet Take 1 tablet (5 mg total) by mouth every 6 (six) hours as needed for severe pain (pain score 7-10). 15 tablet 0   pantoprazole (PROTONIX) 40 MG tablet Take 1 tablet (40 mg total) by mouth daily. 90 tablet 3   naproxen (NAPROSYN) 500 MG tablet Take 1 tablet (500 mg total) by mouth 2 (two) times daily. (Patient not taking: Reported on 07/24/2023) 30 tablet 0   ondansetron (ZOFRAN-ODT) 4 MG disintegrating tablet Take 1 tablet (4 mg total) by mouth every 8 (eight) hours as needed. (Patient not taking: Reported on 07/24/2023) 20 tablet 0   No facility-administered medications prior to visit.    Family History  Problem Relation Age of Onset   Stroke Sister 78   Hypertension Sister    Depression Sister    Heart attack Sister 32   Colon cancer Neg Hx    Colon polyps Neg Hx    Esophageal cancer Neg Hx    Rectal cancer Neg Hx    Stomach cancer Neg Hx    Breast cancer Neg Hx     Social History   Socioeconomic History   Marital status: Single    Spouse name: Not on file   Number of children: 8   Years of education: Not on file   Highest education level: Not on file  Occupational History   Not on file  Tobacco Use   Smoking status: Never   Smokeless tobacco: Never  Vaping Use   Vaping  status: Never Used  Substance and Sexual Activity   Alcohol use: No   Drug use: No   Sexual activity: Not Currently    Birth control/protection: Condom    Comment: 1st intercourse 15yo-5 partners  Other Topics Concern   Not on file  Social History Narrative   Not on file   Social Drivers of Health   Financial Resource Strain: Not on file  Food Insecurity: No Food Insecurity (07/05/2021)   Received from [facility], [facility]   Hunger Vital Sign    Worried About Running Out of Food in the Last Year: Never true    Ran Out of Food in the Last Year: Never true  Transportation Needs: Not on file  Physical Activity: Not on file   Stress: Not on file  Social Connections: Unknown (11/07/2021)   Received from [facility], [facility]   Social Network    Social Network: Not on file  Intimate Partner Violence: Unknown (09/28/2021)   Received from [facility], [facility]   HITS    Physically Hurt: Not on file    Insult or Talk Down To: Not on file    Threaten Physical Harm: Not on file    Scream or Curse: Not on file                                                                                                  Objective:  Physical Exam: BP 126/86 Comment: Home number this AM/clinic this encounter  Pulse 79   Temp 97.9 F (36.6 C) (Temporal)   Wt 204 lb 12.8 oz (92.9 kg)   LMP  (LMP Unknown)   SpO2 98%   BMI 36.28 kg/m     Physical Exam Constitutional:      General: She is not in acute distress.    Appearance: Normal appearance. She is not ill-appearing or toxic-appearing.  HENT:     Head: Normocephalic and atraumatic.     Nose: Nose normal. No congestion.  Eyes:     General: No scleral icterus.    Extraocular Movements: Extraocular movements intact.  Cardiovascular:     Rate and Rhythm: Normal rate and regular rhythm.     Pulses: Normal pulses.     Heart sounds: Normal heart sounds.  Pulmonary:     Effort: Pulmonary effort is normal. No respiratory distress.     Breath sounds: Normal breath sounds.  Abdominal:     General: Abdomen is flat. Bowel sounds are normal.     Palpations: Abdomen is soft.  Musculoskeletal:     Left shoulder: Tenderness present. Decreased range of motion.  Lymphadenopathy:     Cervical: No cervical adenopathy.  Skin:    General: Skin is warm and dry.     Findings: No rash.  Neurological:     General: No focal deficit present.     Mental Status: She is alert and oriented to person, place, and time. Mental status is at baseline.  Psychiatric:        Mood and Affect: Mood normal.        Behavior: Behavior  normal.        Thought Content: Thought content  normal.        Judgment: Judgment normal.     US PELVIC COMPLETE WITH TRANSVAGINAL Result Date: 07/17/2023 CLINICAL DATA:  Abnormal uterine bleeding. Fibroids. Patient gives history of fibroid removed in 2023. Unknown LMP. Unknown menopausal status. EXAM: TRANSABDOMINAL AND TRANSVAGINAL ULTRASOUND OF PELVIS TECHNIQUE: Both transabdominal and transvaginal ultrasound examinations of the pelvis were performed. Transabdominal technique was performed for global imaging of the pelvis including uterus, ovaries, adnexal regions, and pelvic cul-de-sac. It was necessary to proceed with endovaginal exam following the transabdominal exam to visualize the uterus, endometrium ovaries, and adnexal regions. COMPARISON:  CT of the abdomen and pelvis 10/10/2024and ultrasound pelvis 12/03/2018 FINDINGS: Uterus Measurements: At least 12.2 x 7.1 x 9.7 centimeters = volume: 140.0 mL. Uterus is enlarged with numerous fibroids. Largest discrete fibroids measure 4.9 x 4.7 x 5.0 centimeters, 5.7 x 5.6 x 6.5 centimeters, and 3.4 x 2.2 x 3.0 centimeters. Endometrium Thickness: 12.0 millimeters. Displaced by numerous fibroids. Numerous central fibroids displace or are likely contiguous with the endometrium. Right ovary Measurements: 3.4 x 2.2 x 2.9 centimeters = volume: 11.3 mL. Normal appearance/no adnexal mass. Left ovary Measurements: Not Seen Other findings Study quality is degraded by patient body habitus and overlying bowel gas. IMPRESSION: 1. Enlarged uterus with numerous fibroids, largest 5.7 centimeters. 2. Numerous central fibroids with likely submucosal components. 3. Endometrium is thickened, 12 millimeters. In the setting of post-menopausal bleeding, endometrial sampling is indicated to exclude carcinoma. If results are benign, sonohysterogram should be considered for focal lesion work-up. (Ref: Radiological Reasoning: Algorithmic Workup of Abnormal Vaginal Bleeding with Endovaginal Sonography and Sonohysterography. AJR 2008;  [id number]:[id number]) Electronically Signed   By: Norva Pavlov M.D.   On: 07/17/2023 06:00   Abdomen 1 view (KUB) Result Date: 07/08/2023 CLINICAL DATA:  nephrolithiasis EXAM: ABDOMEN - 1 VIEW COMPARISON:  X-ray abdomen 05/07/2019, CT abdomen pelvis 04/05/2023 FINDINGS: Limited evaluation due to overlapping osseous structures and overlying soft tissues. The bowel gas pattern is normal. 1.2 cm calcification overlying the right renal shadow. Slightly more superiorly 0.9 cm calcification overlying the right renal shadow. Left renal shadow poorly visualized due to overlying bowel. Phleboliths overlie the pelvis. IMPRESSION: 1. Calcifications overlying the right renal shadow that may represent nephrolithiasis. Limited evaluation due to overlapping osseous structures and overlying soft tissues. 2. Nonobstructive bowel gas pattern. Electronically Signed   By: Tish Frederickson M.D.   On: 07/08/2023 00:34   US RENAL Result Date: 07/05/2023 CLINICAL DATA:  Nephrolithiasis EXAM: RENAL / URINARY TRACT ULTRASOUND COMPLETE COMPARISON:  CT abdomen pelvis 04/05/2023 FINDINGS: Right Kidney: Renal measurements: 10.4 x 4.9 x 5.9 cm = volume: 158 mL. Normal renal cortical thickness and echogenicity. No renal mass. Mild pelviectasis. There is a 16 mm stone. Left Kidney: Renal measurements: 11.6 x 5.6 x 5.7 cm = volume: 193 mL. Normal renal cortical thickness and echogenicity. No mass. Mild superior pole pelviectasis. Bladder: Appears normal for degree of bladder distention. Other: None. IMPRESSION: 1. Mild bilateral pelviectasis. 2. Right nephrolithiasis. Electronically Signed   By: Annia Belt M.D.   On: 07/05/2023 16:42   DG Abd 1 View Result Date: 05/20/2023 CLINICAL DATA:  Right-sided nephrolithiasis, pre lithotripsy. EXAM: ABDOMEN - 1 VIEW COMPARISON:  Radiograph 04/30/2023, CT 04/05/2023 FINDINGS: Stable positioning of 12 mm stone in the lower pole of the right kidney. No additional urolithiasis. Stable calcifications in the  pelvis typical of phleboliths. Normal bowel gas pattern. Small volume of  stool in the colon. IMPRESSION: Stable positioning of 12 mm stone in the lower pole of the right kidney. Electronically Signed   By: Narda Rutherford M.D.   On: 05/20/2023 13:40   Abdomen 1 view (KUB) Result Date: 05/20/2023 CLINICAL DATA:  Pre lithotripsy, right kidney stone. EXAM: ABDOMEN - 1 VIEW COMPARISON:  CT 04/05/2023 FINDINGS: 12 mm stone in the lower pole of the right kidney. Calcifications in the pelvis are phleboliths on prior CT. Normal bowel gas pattern, small to moderate colonic stool burden. IMPRESSION: A 12 mm stone in the lower pole of the right kidney. Electronically Signed   By: Narda Rutherford M.D.   On: 05/20/2023 13:40    Recent Results (from the past 2160 hours)  Urinalysis, Routine w reflex microscopic     Status: Abnormal   Collection Time: 04/30/23 12:00 AM  Result Value Ref Range   Specific Gravity, UA 1.025 1.005 - 1.030   pH, UA 6.0 5.0 - 7.5   Color, UA Yellow Yellow   Appearance Ur Clear Clear   Leukocytes,UA Negative Negative   Protein,UA Negative Negative/Trace   Glucose, UA Negative Negative   Ketones, UA Negative Negative   RBC, UA Trace (A) Negative   Bilirubin, UA Negative Negative   Urobilinogen, Ur 1.0 0.2 - 1.0 mg/dL   Nitrite, UA Negative Negative   Microscopic Examination See below:   Microscopic Examination     Status: None   Collection Time: 04/30/23 12:00 AM   Urine  Result Value Ref Range   WBC, UA 0-5 0 - 5 /hpf   RBC, Urine None seen 0 - 2 /hpf   Epithelial Cells (non renal) 0-10 0 - 10 /hpf   Bacteria, UA None seen None seen/Few  Pregnancy, urine POC     Status: None   Collection Time: 05/07/23 11:52 AM  Result Value Ref Range   Preg Test, Ur NEGATIVE NEGATIVE    Comment:        THE SENSITIVITY OF THIS METHODOLOGY IS >24 mIU/mL   Basic metabolic panel     Status: Abnormal   Collection Time: 05/31/23  3:15 PM  Result Value Ref Range   Sodium 140 135 -  145 mEq/L   Potassium 4.1 3.5 - 5.1 mEq/L   Chloride 104 96 - 112 mEq/L   CO2 26 19 - 32 mEq/L   Glucose, Bld 115 (H) 70 - 99 mg/dL   BUN 11 6 - 23 mg/dL   Creatinine, Ser 5.28 0.40 - 1.20 mg/dL   GFR 41.32 >44.01 mL/min    Comment: Calculated using the CKD-EPI Creatinine Equation (2021)   Calcium 9.7 8.4 - 10.5 mg/dL  Hemoglobin U2V     Status: None   Collection Time: 05/31/23  3:15 PM  Result Value Ref Range   Hgb A1c MFr Bld 6.0 4.6 - 6.5 %    Comment: Glycemic Control Guidelines for People with Diabetes:Non Diabetic:  <6%Goal of Therapy: <7%Additional Action Suggested:  >8%   Lipid panel     Status: Abnormal   Collection Time: 05/31/23  3:15 PM  Result Value Ref Range   Cholesterol 178 0 - 200 mg/dL    Comment: ATP III Classification       Desirable:  < 200 mg/dL               Borderline High:  200 - 239 mg/dL          High:  > = 253 mg/dL   Triglycerides 664.4 (H)  0.0 - 149.0 mg/dL    Comment: Normal:  <540 mg/dLBorderline High:  150 - 199 mg/dL   HDL 98.11 (L) >91.47 mg/dL   VLDL 82.9 (H) 0.0 - 56.2 mg/dL   LDL Cholesterol 92 0 - 99 mg/dL   Total CHOL/HDL Ratio 5     Comment:                Men          Women1/2 Average Risk     3.4          3.3Average Risk          5.0          4.42X Average Risk          9.6          7.13X Average Risk          15.0          11.0                       NonHDL 142.20     Comment: NOTE:  Non-HDL goal should be 30 mg/dL higher than patient's LDL goal (i.e. LDL goal of < 70 mg/dL, would have non-HDL goal of < 100 mg/dL)  Urinalysis, Routine w reflex microscopic     Status: Abnormal   Collection Time: 06/29/23  3:51 PM  Result Value Ref Range   Specific Gravity, UA 1.020 1.005 - 1.030   pH, UA 6.0 5.0 - 7.5   Color, UA Yellow Yellow   Appearance Ur Clear Clear   Leukocytes,UA Trace (A) Negative   Protein,UA Negative Negative/Trace   Glucose, UA Negative Negative   Ketones, UA Negative Negative   RBC, UA Trace (A) Negative   Bilirubin, UA  Negative Negative   Urobilinogen, Ur 0.2 0.2 - 1.0 mg/dL   Nitrite, UA Negative Negative   Microscopic Examination See below:   Microscopic Examination     Status: None   Collection Time: 06/29/23  3:51 PM   Urine  Result Value Ref Range   WBC, UA 0-5 0 - 5 /hpf   RBC, Urine 0-2 0 - 2 /hpf   Epithelial Cells (non renal) 0-10 0 - 10 /hpf        Garner Nash, MD, MS

## 2023-07-30 ENCOUNTER — Ambulatory Visit: Payer: Commercial Managed Care - HMO | Admitting: Obstetrics and Gynecology

## 2023-07-30 ENCOUNTER — Encounter: Payer: Self-pay | Admitting: Obstetrics and Gynecology

## 2023-07-30 VITALS — BP 123/83 | HR 103 | Ht 63.0 in | Wt 204.0 lb

## 2023-07-30 DIAGNOSIS — N951 Menopausal and female climacteric states: Secondary | ICD-10-CM | POA: Diagnosis not present

## 2023-07-30 DIAGNOSIS — Z712 Person consulting for explanation of examination or test findings: Secondary | ICD-10-CM

## 2023-07-30 NOTE — Progress Notes (Signed)
58 yo returning to discuss ultrasound report. Patient with history of myomectomy in 2023 due to AUB returns for results of recent ultrasound. Patient reports irregular vaginal bleeding which she describes as 1-2 days of spotting every 3-6 months. She reports vasomotor symptoms. She states this bleeding pattern has been present since her myomectomy. She was told that the biggest fibroid was removes but several were left behind. She is without any other complaints  Past Medical History:  Diagnosis Date   Allergy    Anemia    Arthritis    Blood transfusion without reported diagnosis    Fibroid    GERD (gastroesophageal reflux disease)    Hypertension    LGSIL of cervix of undetermined significance 06/2017   Few cells suggesting high-grade lesion.  Colposcopy showed inflammatory changes.  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  Recommend follow-up Pap smear 1 year   Peptic ulcer    Upper GI bleed 10/28/2017   Vaginal Pap smear, abnormal    Past Surgical History:  Procedure Laterality Date   DILATION AND CURETTAGE OF UTERUS     ESOPHAGOGASTRODUODENOSCOPY N/A 10/30/2017   Procedure: ESOPHAGOGASTRODUODENOSCOPY (EGD);  Surgeon: Napoleon Form, MD;  Location: [facility];  Service: Endoscopy;  Laterality: N/A;   EXTRACORPOREAL SHOCK WAVE LITHOTRIPSY Right 05/07/2023   Procedure: EXTRACORPOREAL SHOCK WAVE LITHOTRIPSY (ESWL);  Surgeon: Milderd Meager., MD;  Location: [facility];  Service: Urology;  Laterality: Right;   Family History  Problem Relation Age of Onset   Stroke Sister 75   Hypertension Sister    Depression Sister    Heart attack Sister 42   Colon cancer Neg Hx    Colon polyps Neg Hx    Esophageal cancer Neg Hx    Rectal cancer Neg Hx    Stomach cancer Neg Hx    Breast cancer Neg Hx    Social History   Tobacco Use   Smoking status: Never   Smokeless tobacco: Never  Vaping Use   Vaping status: Never Used  Substance Use Topics   Alcohol use: No   Drug  use: No   ROS See pertinent in HPI. All other systems reviewed and non contributory Blood pressure 123/83, pulse (!) 103, height 5\' 3"  (1.6 m), weight 204 lb (92.5 kg).  GENERAL: Well-developed, well-nourished female in no acute distress.  NEURO: alert and oriented x 3  US PELVIC COMPLETE WITH TRANSVAGINAL Result Date: 07/17/2023 CLINICAL DATA:  Abnormal uterine bleeding. Fibroids. Patient gives history of fibroid removed in 2023. Unknown LMP. Unknown menopausal status. EXAM: TRANSABDOMINAL AND TRANSVAGINAL ULTRASOUND OF PELVIS TECHNIQUE: Both transabdominal and transvaginal ultrasound examinations of the pelvis were performed. Transabdominal technique was performed for global imaging of the pelvis including uterus, ovaries, adnexal regions, and pelvic cul-de-sac. It was necessary to proceed with endovaginal exam following the transabdominal exam to visualize the uterus, endometrium ovaries, and adnexal regions. COMPARISON:  CT of the abdomen and pelvis 10/10/2024and ultrasound pelvis 12/03/2018 FINDINGS: Uterus Measurements: At least 12.2 x 7.1 x 9.7 centimeters = volume: 140.0 mL. Uterus is enlarged with numerous fibroids. Largest discrete fibroids measure 4.9 x 4.7 x 5.0 centimeters, 5.7 x 5.6 x 6.5 centimeters, and 3.4 x 2.2 x 3.0 centimeters. Endometrium Thickness: 12.0 millimeters. Displaced by numerous fibroids. Numerous central fibroids displace or are likely contiguous with the endometrium. Right ovary Measurements: 3.4 x 2.2 x 2.9 centimeters = volume: 11.3 mL. Normal appearance/no adnexal mass. Left ovary Measurements: Not Seen Other findings Study quality is degraded by patient body  habitus and overlying bowel gas. IMPRESSION: 1. Enlarged uterus with numerous fibroids, largest 5.7 centimeters. 2. Numerous central fibroids with likely submucosal components. 3. Endometrium is thickened, 12 millimeters. In the setting of post-menopausal bleeding, endometrial sampling is indicated to exclude  carcinoma. If results are benign, sonohysterogram should be considered for focal lesion work-up. (Ref: Radiological Reasoning: Algorithmic Workup of Abnormal Vaginal Bleeding with Endovaginal Sonography and Sonohysterography. AJR 2008; 284:X32-44) Electronically Signed   By: Norva Pavlov M.D.   On: 07/17/2023 06:00   US RENAL Result Date: 07/05/2023 CLINICAL DATA:  Nephrolithiasis EXAM: RENAL / URINARY TRACT ULTRASOUND COMPLETE COMPARISON:  CT abdomen pelvis 04/05/2023 FINDINGS: Right Kidney: Renal measurements: 10.4 x 4.9 x 5.9 cm = volume: 158 mL. Normal renal cortical thickness and echogenicity. No renal mass. Mild pelviectasis. There is a 16 mm stone. Left Kidney: Renal measurements: 11.6 x 5.6 x 5.7 cm = volume: 193 mL. Normal renal cortical thickness and echogenicity. No mass. Mild superior pole pelviectasis. Bladder: Appears normal for degree of bladder distention. Other: None. IMPRESSION: 1. Mild bilateral pelviectasis. 2. Right nephrolithiasis. Electronically Signed   By: Annia Belt M.D.   On: 07/05/2023 16:42    A/P 58 yo with AUB - Discussed benefits of endometrial biopsy to complete evaluation of vaginal bleeding - Patient declined endometrial biopsy. She reports normal endometrial biopsy in 2023 - Patient asked to keep menstrual calendar and to return if bleeding increases - Will check TSH, FSH/LH

## 2023-07-31 LAB — FSH/LH
FSH: 28.7 m[IU]/mL
LH: 17 m[IU]/mL

## 2023-07-31 LAB — ESTRADIOL: Estradiol: 37.2 pg/mL

## 2023-07-31 LAB — TSH: TSH: 1.19 u[IU]/mL (ref 0.450–4.500)

## 2023-08-01 ENCOUNTER — Encounter: Payer: Self-pay | Admitting: Nurse Practitioner

## 2023-08-13 ENCOUNTER — Ambulatory Visit: Payer: Commercial Managed Care - HMO | Admitting: Family Medicine

## 2023-11-13 DIAGNOSIS — Z0289 Encounter for other administrative examinations: Secondary | ICD-10-CM

## 2023-11-29 ENCOUNTER — Encounter: Payer: Self-pay | Admitting: Nurse Practitioner

## 2023-11-29 ENCOUNTER — Ambulatory Visit (INDEPENDENT_AMBULATORY_CARE_PROVIDER_SITE_OTHER): Payer: Commercial Managed Care - HMO | Admitting: Nurse Practitioner

## 2023-11-29 VITALS — BP 132/80 | HR 105 | Temp 97.1°F | Ht 63.0 in | Wt 206.0 lb

## 2023-11-29 DIAGNOSIS — E559 Vitamin D deficiency, unspecified: Secondary | ICD-10-CM

## 2023-11-29 DIAGNOSIS — Z1231 Encounter for screening mammogram for malignant neoplasm of breast: Secondary | ICD-10-CM

## 2023-11-29 DIAGNOSIS — Z0000 Encounter for general adult medical examination without abnormal findings: Secondary | ICD-10-CM | POA: Insufficient documentation

## 2023-11-29 DIAGNOSIS — E538 Deficiency of other specified B group vitamins: Secondary | ICD-10-CM | POA: Diagnosis not present

## 2023-11-29 DIAGNOSIS — R7303 Prediabetes: Secondary | ICD-10-CM

## 2023-11-29 DIAGNOSIS — E669 Obesity, unspecified: Secondary | ICD-10-CM

## 2023-11-29 DIAGNOSIS — I10 Essential (primary) hypertension: Secondary | ICD-10-CM | POA: Diagnosis not present

## 2023-11-29 DIAGNOSIS — R350 Frequency of micturition: Secondary | ICD-10-CM

## 2023-11-29 DIAGNOSIS — M4302 Spondylolysis, cervical region: Secondary | ICD-10-CM

## 2023-11-29 LAB — POCT URINALYSIS DIPSTICK
Bilirubin, UA: NEGATIVE
Glucose, UA: NEGATIVE
Ketones, UA: NEGATIVE
Leukocytes, UA: NEGATIVE
Nitrite, UA: NEGATIVE
Protein, UA: POSITIVE — AB
Spec Grav, UA: >=1.03 — AB (ref 1.010–1.025)
Urobilinogen, UA: 1 U/dL — AB
pH, UA: 6 (ref 5.0–8.0)

## 2023-11-29 MED ORDER — LOSARTAN POTASSIUM-HCTZ 100-12.5 MG PO TABS: 1.0000 | ORAL_TABLET | Freq: Every day | ORAL | 3 refills | Status: AC

## 2023-11-29 NOTE — Patient Instructions (Addendum)
 It was great to see you!  We are checking your labs today and will let you know the results via mychart/phone.   I have refilled your blood pressure medication   I have placed an order for screening mammogram, they should call you to schedule. If you do not hear from them in the next week, please call:  [facility] [address], [address] [place], [place] [phone]  Let's follow-up in 6 months, sooner if you have concerns.  If a referral was placed today, you will be contacted for an appointment. Please note that routine referrals can sometimes take up to 3-4 weeks to process. Please call our office if you haven't heard anything after this time frame.  Take care,  Rheba Cedar, NP

## 2023-11-29 NOTE — Assessment & Plan Note (Signed)
Chronic, stable. Check A1c today and treat based on results.

## 2023-11-29 NOTE — Assessment & Plan Note (Signed)
Will check vitamin B12 today and treat based on results.

## 2023-11-29 NOTE — Assessment & Plan Note (Signed)
 Chronic, stable. Continue losartan -hctz 100-12.5mg  daily, check blood pressure regularly. Check CMP, CBC, lipid panel today.

## 2023-11-29 NOTE — Assessment & Plan Note (Signed)
Health maintenance reviewed and updated. Discussed nutrition, exercise. Follow-up 1 year.

## 2023-11-29 NOTE — Assessment & Plan Note (Signed)
 BMI 36.5. She has an appointment with [facility] which she is looking forward to.

## 2023-11-29 NOTE — Progress Notes (Signed)
 BP 132/80 (BP Location: Left Arm, Patient Position: Sitting, Cuff Size: Normal)   Pulse (!) 105   Temp (!) 97.1 F (36.2 C)   Ht 5\' 3"  (1.6 m)   Wt 206 lb (93.4 kg)   SpO2 97%   BMI 36.49 kg/m    Subjective:    Patient ID: Anita Holland, female    DOB: [birth year], 58 y.o.   MRN: [id number]  CC: Chief Complaint  Patient presents with   Annual Exam    With lab work-patient is not fasting, concerns with frequent urination    HPI: Anita Holland is a 58 y.o. female presenting on 11/29/2023 for comprehensive medical examination. Current medical complaints include:urinary frequency   She started having urinary frequency today. She denies dysuria, small voids, pelvic pain, and fever. She did not drink any more water than normal.   She currently lives with: alone Menopausal Symptoms: yes - irregular periods, hot flashes   Depression and Anxiety Screen done today and results listed below:     11/29/2023    3:53 PM 05/15/2023    2:55 PM 04/12/2023    3:02 PM 06/15/2022    8:13 PM 12/05/2021    2:59 PM  Depression screen PHQ 2/9  Decreased Interest 2 1 0 0 0  Down, Depressed, Hopeless 0 0 0 0 0  PHQ - 2 Score 2 1 0 0 0  Altered sleeping 0    0  Tired, decreased energy 3    3  Change in appetite 3    1  Feeling bad or failure about yourself  0    0  Trouble concentrating 1    3  Moving slowly or fidgety/restless 0    0  Suicidal thoughts 0    0  PHQ-9 Score 9    7  Difficult doing work/chores Not difficult at all    Not difficult at all      11/29/2023    3:53 PM 08/17/2021    4:23 PM  GAD 7 : Generalized Anxiety Score  Nervous, Anxious, on Edge 0 0  Control/stop worrying 0 0  Worry too much - different things 0 0  Trouble relaxing 1 1  Restless 0 0  Easily annoyed or irritable 0 0  Afraid - awful might happen 0 0  Total GAD 7 Score 1 1  Anxiety Difficulty Not difficult at all Somewhat difficult    The patient does not have a history of falls. I did not complete a risk  assessment for falls. A plan of care for falls was not documented.   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  Colposcopy showed inflammatory changes.  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  Recommend follow-up Pap smear 1 year   Peptic ulcer    Upper GI bleed 10/28/2017   Vaginal Pap smear, abnormal     Surgical History:  Past Surgical History:  Procedure Laterality Date   DILATION AND CURETTAGE OF UTERUS     ESOPHAGOGASTRODUODENOSCOPY N/A 10/30/2017   Procedure: ESOPHAGOGASTRODUODENOSCOPY (EGD);  Surgeon: Nandigam, Kavitha V, MD;  Location: [facility];  Service: Endoscopy;  Laterality: N/A;   EXTRACORPOREAL SHOCK WAVE LITHOTRIPSY Right 05/07/2023  Procedure: EXTRACORPOREAL SHOCK WAVE LITHOTRIPSY (ESWL);  Surgeon: Mellie Sprinkle., MD;  Location: [facility];  Service: Urology;  Laterality: Right;    Medications:  Current Outpatient Medications on File Prior to Visit  Medication Sig   acetaminophen  (TYLENOL ) 500 MG tablet Take by mouth.   Multiple Vitamins-Minerals (CENTRUM FRESH/FRUITY 50+) CHEW Chew 1 tablet by mouth daily.   gabapentin  (NEURONTIN ) 300 MG capsule Take 1 capsule (300 mg total) by mouth at bedtime. (Patient not taking: Reported on 11/29/2023)   megestrol  (MEGACE ) 40 MG tablet Take 1 tablet (40 mg total) by mouth 2 (two) times daily. Can increase to two tablets twice a day in the event of heavy bleeding (Patient not taking: Reported on 11/29/2023)   pantoprazole  (PROTONIX ) 40 MG tablet Take 1 tablet (40 mg total) by mouth daily. (Patient not taking: Reported on 11/29/2023)   No current facility-administered medications on file prior to visit.    Allergies:  No Known  Allergies  Social History:  Social History   Socioeconomic History   Marital status: Single    Spouse name: Not on file   Number of children: 8   Years of education: Not on file   Highest education level: Not on file  Occupational History   Not on file  Tobacco Use   Smoking status: Never   Smokeless tobacco: Never  Vaping Use   Vaping status: Never Used  Substance and Sexual Activity   Alcohol use: No   Drug use: No   Sexual activity: Not Currently    Birth control/protection: Condom    Comment: 1st intercourse 15yo-5 partners  Other Topics Concern   Not on file  Social History Narrative   Not on file   Social Drivers of Health   Financial Resource Strain: Not on file  Food Insecurity: No Food Insecurity (07/05/2021)   Received from [facility], [facility]   Hunger Vital Sign    Worried About Running Out of Food in the Last Year: Never true    Ran Out of Food in the Last Year: Never true  Transportation Needs: Not on file  Physical Activity: Not on file  Stress: Not on file  Social Connections: Unknown (11/07/2021)   Received from [facility], [facility]   Social Network    Social Network: Not on file  Intimate Partner Violence: Unknown (09/28/2021)   Received from [facility], [facility]   HITS    Physically Hurt: Not on file    Insult or Talk Down To: Not on file    Threaten Physical Harm: Not on file    Scream or Curse: Not on file   Social History   Tobacco Use  Smoking Status Never  Smokeless Tobacco Never   Social History   Substance and Sexual Activity  Alcohol Use No    Family History:  Family History  Problem Relation Age of Onset   Stroke Sister 24   Hypertension Sister    Depression Sister    Heart attack Sister 54   Colon cancer Neg Hx    Colon polyps Neg Hx    Esophageal cancer Neg Hx    Rectal cancer Neg Hx    Stomach cancer Neg Hx    Breast cancer Neg Hx     Past medical history, surgical history, medications,  allergies, family history and social history reviewed with patient today and changes made to appropriate areas of the chart.   Review of Systems  Constitutional: Negative.  HENT: Negative.    Eyes: Negative.   Respiratory: Negative.    Cardiovascular: Negative.   Gastrointestinal: Negative.   Genitourinary:  Positive for frequency and urgency. Negative for dysuria, flank pain and hematuria.  Musculoskeletal:  Positive for neck pain.  Skin: Negative.   Neurological: Negative.   Psychiatric/Behavioral: Negative.     All other ROS negative except what is listed above and in the HPI.      Objective:     BP 132/80 (BP Location: Left Arm, Patient Position: Sitting, Cuff Size: Normal)   Pulse (!) 105   Temp (!) 97.1 F (36.2 C)   Ht 5\' 3"  (1.6 m)   Wt 206 lb (93.4 kg)   SpO2 97%   BMI 36.49 kg/m   Wt Readings from Last 3 Encounters:  11/29/23 206 lb (93.4 kg)  07/30/23 204 lb (92.5 kg)  07/24/23 204 lb 12.8 oz (92.9 kg)    Physical Exam Vitals and nursing note reviewed.  Constitutional:      General: She is not in acute distress.    Appearance: Normal appearance.  HENT:     Head: Normocephalic and atraumatic.     Right Ear: Tympanic membrane, ear canal and external ear normal.     Left Ear: Tympanic membrane, ear canal and external ear normal.     Mouth/Throat:     Mouth: Mucous membranes are moist.     Pharynx: No posterior oropharyngeal erythema.  Eyes:     Conjunctiva/sclera: Conjunctivae normal.  Cardiovascular:     Rate and Rhythm: Normal rate and regular rhythm.     Pulses: Normal pulses.     Heart sounds: Normal heart sounds.  Pulmonary:     Effort: Pulmonary effort is normal.     Breath sounds: Normal breath sounds.  Abdominal:     Palpations: Abdomen is soft.     Tenderness: There is no abdominal tenderness.  Musculoskeletal:        General: Normal range of motion.     Cervical back: Normal range of motion and neck supple.     Right lower leg: No  edema.     Left lower leg: No edema.  Lymphadenopathy:     Cervical: No cervical adenopathy.  Skin:    General: Skin is warm and dry.  Neurological:     General: No focal deficit present.     Mental Status: She is alert and oriented to person, place, and time.     Cranial Nerves: No cranial nerve deficit.     Coordination: Coordination normal.     Gait: Gait normal.  Psychiatric:        Mood and Affect: Mood normal.        Behavior: Behavior normal.        Thought Content: Thought content normal.        Judgment: Judgment normal.     Results for orders placed or performed in visit on 11/29/23  POCT urinalysis dipstick   Collection Time: 11/29/23  3:51 PM  Result Value Ref Range   Color, UA     Clarity, UA     Glucose, UA Negative Negative   Bilirubin, UA Negative    Ketones, UA Negative    Spec Grav, UA >=1.030 (A) 1.010 - 1.025   Blood, UA 3+    pH, UA 6.0 5.0 - 8.0   Protein, UA Positive (A) Negative   Urobilinogen, UA 1.0 (A) 0.2 or 1.0 E.U./dL   Nitrite, UA Negative  Leukocytes, UA Negative Negative   Appearance     Odor        Assessment & Plan:   Problem List Items Addressed This Visit       Cardiovascular and Mediastinum   Primary hypertension   Chronic, stable. Continue losartan -hctz 100-12.5mg  daily, check blood pressure regularly. Check CMP, CBC, lipid panel today.       Relevant Medications   losartan -hydrochlorothiazide  (HYZAAR) 100-12.5 MG tablet   Other Relevant Orders   CBC with Differential/Platelet   Comprehensive metabolic panel with GFR   Lipid panel     Musculoskeletal and Integument   Spondylolysis, cervical region   Chronic, stable. She states the radiculopathy has stopped and is not having any more arm numbness. Continue gabapentin  300mg  daily as needed.         Other   Vitamin D  deficiency   She has history of vitamin D  deficiency, will check levels today and treat based on results.      Relevant Orders   VITAMIN D  25  Hydroxy (Vit-D Deficiency, Fractures)   Vitamin B 12 deficiency   Will check vitamin B12 today and treat based on results.      Relevant Orders   Vitamin B12   Obesity (BMI 30-39.9)   BMI 36.5. She has an appointment with [facility] which she is looking forward to.       Prediabetes   Chronic, stable. Check A1c today and treat based on results.       Relevant Orders   Hemoglobin A1c   Routine general medical examination at a health care facility - Primary   Health maintenance reviewed and updated. Discussed nutrition, exercise. Follow-up 1 year.        Other Visit Diagnoses       Urinary frequency       U/A negative. Checking A1c today.   Relevant Orders   POCT urinalysis dipstick (Completed)     Encounter for screening mammogram for malignant neoplasm of breast       Mammogram ordered today.   Relevant Orders   MM 3D SCREENING MAMMOGRAM BILATERAL BREAST        Follow up plan: Return in about 6 months (around 05/30/2024) for HTN.   LABORATORY TESTING:  - Pap smear: up to date  IMMUNIZATIONS:   - Tdap: Tetanus vaccination status reviewed: last tetanus booster within 10 years. - Influenza: Postponed to flu season - Pneumovax: Not applicable - Prevnar: Not applicable - HPV: Not applicable - Shingrix vaccine: Declined  SCREENING: -Mammogram: Ordered today  - Colonoscopy: Up to date  - Bone Density: Not applicable   PATIENT COUNSELING:   Advised to take 1 mg of folate supplement per day if capable of pregnancy.   Sexuality: Discussed sexually transmitted diseases, partner selection, use of condoms, avoidance of unintended pregnancy  and contraceptive alternatives.   Advised to avoid cigarette smoking.  I discussed with the patient that most people either abstain from alcohol or drink within safe limits (<=14/week and <=4 drinks/occasion for males, <=7/weeks and <= 3 drinks/occasion for females) and that the risk for alcohol disorders and  other health effects rises proportionally with the number of drinks per week and how often a drinker exceeds daily limits.  Discussed cessation/primary prevention of drug use and availability of treatment for abuse.   Diet: Encouraged to adjust caloric intake to maintain  or achieve ideal body weight, to reduce intake of dietary saturated fat and total fat, to limit sodium  intake by avoiding high sodium foods and not adding table salt, and to maintain adequate dietary potassium and calcium preferably from fresh fruits, vegetables, and low-fat dairy products.    stressed the importance of regular exercise  Injury prevention: Discussed safety belts, safety helmets, smoke detector, smoking near bedding or upholstery.   Dental health: Discussed importance of regular tooth brushing, flossing, and dental visits.    NEXT PREVENTATIVE PHYSICAL DUE IN 1 YEAR. Return in about 6 months (around 05/30/2024) for HTN.  Chantale Leugers A Lealand Elting

## 2023-11-29 NOTE — Assessment & Plan Note (Signed)
 Chronic, stable. She states the radiculopathy has stopped and is not having any more arm numbness. Continue gabapentin  300mg  daily as needed.

## 2023-11-29 NOTE — Assessment & Plan Note (Signed)
She has history of vitamin D deficiency, will check levels today and treat based on results.

## 2023-11-30 ENCOUNTER — Ambulatory Visit: Payer: Self-pay | Admitting: Nurse Practitioner

## 2023-11-30 ENCOUNTER — Ambulatory Visit: Payer: Self-pay

## 2023-11-30 LAB — CBC WITH DIFFERENTIAL/PLATELET
Basophils Absolute: 0 10*3/uL (ref 0.0–0.1)
Basophils Relative: 0.5 % (ref 0.0–3.0)
Eosinophils Absolute: 0.2 10*3/uL (ref 0.0–0.7)
Eosinophils Relative: 4 % (ref 0.0–5.0)
HCT: 45 % (ref 36.0–46.0)
Hemoglobin: 14.7 g/dL (ref 12.0–15.0)
Lymphocytes Relative: 28 % (ref 12.0–46.0)
Lymphs Abs: 1.6 10*3/uL (ref 0.7–4.0)
MCHC: 32.7 g/dL (ref 30.0–36.0)
MCV: 87.6 fl (ref 78.0–100.0)
Monocytes Absolute: 0.6 10*3/uL (ref 0.1–1.0)
Monocytes Relative: 9.9 % (ref 3.0–12.0)
Neutro Abs: 3.3 10*3/uL (ref 1.4–7.7)
Neutrophils Relative %: 57.6 % (ref 43.0–77.0)
Platelets: 363 10*3/uL (ref 150.0–400.0)
RBC: 5.14 Mil/uL — ABNORMAL HIGH (ref 3.87–5.11)
RDW: 14.2 % (ref 11.5–15.5)
WBC: 5.8 10*3/uL (ref 4.0–10.5)

## 2023-11-30 LAB — COMPREHENSIVE METABOLIC PANEL WITH GFR
ALT: 23 U/L (ref 0–35)
AST: 20 U/L (ref 0–37)
Albumin: 4.7 g/dL (ref 3.5–5.2)
Alkaline Phosphatase: 51 U/L (ref 39–117)
BUN: 14 mg/dL (ref 6–23)
CO2: 28 meq/L (ref 19–32)
Calcium: 9.9 mg/dL (ref 8.4–10.5)
Chloride: 105 meq/L (ref 96–112)
Creatinine, Ser: 0.9 mg/dL (ref 0.40–1.20)
GFR: 71.31 mL/min (ref >60.00)
Glucose, Bld: 103 mg/dL — ABNORMAL HIGH (ref 70–99)
Potassium: 4.1 meq/L (ref 3.5–5.1)
Sodium: 143 meq/L (ref 135–145)
Total Bilirubin: 0.7 mg/dL (ref 0.2–1.2)
Total Protein: 7.3 g/dL (ref 6.0–8.3)

## 2023-11-30 LAB — LIPID PANEL
Cholesterol: 167 mg/dL (ref 0–200)
HDL: 39.5 mg/dL (ref >39.00)
LDL Cholesterol: 73 mg/dL (ref 0–99)
NonHDL: 127.1
Total CHOL/HDL Ratio: 4
Triglycerides: 272 mg/dL — ABNORMAL HIGH (ref 0.0–149.0)
VLDL: 54.4 mg/dL — ABNORMAL HIGH (ref 0.0–40.0)

## 2023-11-30 LAB — HEMOGLOBIN A1C: Hgb A1c MFr Bld: 5.9 % (ref 4.6–6.5)

## 2023-11-30 LAB — VITAMIN D 25 HYDROXY (VIT D DEFICIENCY, FRACTURES): VITD: 25.52 ng/mL — ABNORMAL LOW (ref 30.00–100.00)

## 2023-11-30 LAB — VITAMIN B12: Vitamin B-12: 544 pg/mL (ref 211–911)

## 2023-11-30 NOTE — Telephone Encounter (Signed)
 I called and spoke with patient and notified her that Barbra Ley has ordered her a mammogram and someone will be in contact with her to get scheduled.

## 2023-11-30 NOTE — Telephone Encounter (Signed)
 FYI Only or Action Required?: Action required by provider  Patient was last seen in primary care on 11/29/2023 by Odette Benjamin, NP. Called Nurse Triage reporting left breast pain. Symptoms began several months ago. Interventions attempted: Nothing. Symptoms are: stable.  Triage Disposition: No disposition on file.  Patient/caregiver understands and will follow disposition?:    Copied from CRM [id number]. Topic: Clinical - Red Word Triage >> Nov 30, 2023 11:19 AM [place] E wrote: [facility] that prompted transfer to Nurse Triage: Pain. Patient has been having some pain in her left breast, pain and been on and off. Reason for Disposition  Screening tests for breast cancer, questions about  Answer Assessment - Initial Assessment Questions 1. SYMPTOM: "What's the main symptom you're concerned about?"  (e.g., lump, pain, rash, nipple discharge)     Pain in left breast; pain is 1/10 and comes and goes 2. LOCATION: "Where is the pain located?"     Left breast 3. ONSET: "When did pain  start?"     Happens once every 4 months 4. PRIOR HISTORY: "Do you have any history of prior problems with your breasts?" (e.g., lumps, cancer, fibrocystic breast disease)     no 5. CAUSE: "What do you think is causing this symptom?"     unknown 6. OTHER SYMPTOMS: "Do you have any other symptoms?" (e.g., fever, breast pain, redness or rash, nipple discharge)     na 7. PREGNANCY-BREASTFEEDING: "Is there any chance you are pregnant?" "When was your last menstrual period?" "Are you breastfeeding?"     Na Was calling to have xray and mammogram ordered.  Protocols used: Breast Symptoms-A-AH

## 2023-12-12 ENCOUNTER — Ambulatory Visit: Admitting: Family Medicine

## 2023-12-12 ENCOUNTER — Encounter: Payer: Self-pay | Admitting: Family Medicine

## 2023-12-12 ENCOUNTER — Encounter: Payer: Self-pay | Admitting: *Deleted

## 2023-12-12 VITALS — BP 134/90 | HR 89 | Temp 98.9°F | Ht 62.5 in | Wt 199.0 lb

## 2023-12-12 DIAGNOSIS — R7303 Prediabetes: Secondary | ICD-10-CM

## 2023-12-12 DIAGNOSIS — I10 Essential (primary) hypertension: Secondary | ICD-10-CM

## 2023-12-12 DIAGNOSIS — R4589 Other symptoms and signs involving emotional state: Secondary | ICD-10-CM

## 2023-12-12 DIAGNOSIS — R5383 Other fatigue: Secondary | ICD-10-CM | POA: Diagnosis not present

## 2023-12-12 DIAGNOSIS — R0602 Shortness of breath: Secondary | ICD-10-CM | POA: Diagnosis not present

## 2023-12-12 DIAGNOSIS — E559 Vitamin D deficiency, unspecified: Secondary | ICD-10-CM

## 2023-12-12 NOTE — Progress Notes (Signed)
 At a Glance:  Vitals Temp: 98.9 F (37.2 C) BP: (!) 134/90 Pulse Rate: 89 SpO2: 100 %   Anthropometric Measurements Height: 5' 2.5 (1.588 m) Weight: 199 lb (90.3 kg) BMI (Calculated): 35.8 Starting Weight: 199lb   Body Composition  Body Fat %: 43.7 % Fat Mass (lbs): 87 lbs Muscle Mass (lbs): 106.4 lbs Total Body Water (lbs): 72 lbs Visceral Fat Rating : 12   Other Clinical Data RMR: 1613 Fasting: no Labs: no Today's Visit #: 1 Starting Date: 12/12/23    EKG: Normal sinus rhythm, rate 89.  Indirect Calorimeter completed today shows a VO2 of 233 and a REE of 1613.  Her calculated basal metabolic rate is 3086 thus her basal metabolic rate is better than expected.  Chief Complaint:  Obesity   Subjective:  Anita Holland (MR# [id number]) is a 58 y.o. female who presents for evaluation and treatment of obesity and related comorbidities.   Anita Holland is currently in the action stage of change and ready to dedicate time achieving and maintaining a healthier weight. Anita Holland is interested in becoming our patient and working on intensive lifestyle modifications including (but not limited to) diet and exercise for weight loss.  Anita Holland has been struggling with her weight. She has been unsuccessful in either losing weight, maintaining weight loss, or reaching her healthy weight goal.  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  She lives alone after having 9.  She is perimenopausal.  She has been actively working on weight loss, reducing meat.  She believes,.  She reports junk food snacking and excess appetite.  She has never used antiobesity medication.  Anita Holland's habits were reviewed today and are as follows: she started gaining weight around age 57 after she was dating a man that she is no longer with.  She had been eating out a lot. she has significant food cravings issues, she is frequently drinking liquids with calories, she frequently makes poor food choices, and she  struggles with emotional eating.   Other Fatigue Anita Holland admits to daytime somnolence and denies waking up still tired. Patient has a history of symptoms of morning fatigue. Anita Holland generally gets 9 or 10 hours of sleep per night, and states that she has generally restful sleep. Snoring is not present. Apneic episodes are not present. Epworth Sleepiness Score is 14.   Shortness of Breath Anita Holland notes increasing shortness of breath with exercising and seems to be worsening over time with weight gain. She notes getting out of breath sooner with activity than she used to. This has not gotten worse recently. Anita Holland denies shortness of breath at rest or orthopnea.   Depression Screen Anita Holland's Food and Mood (modified PHQ-9) score was 20.     11/29/2023    3:53 PM  Depression screen PHQ 2/9  Decreased Interest 2  Down, Depressed, Hopeless 0  PHQ - 2 Score 2  Altered sleeping 0  Tired, decreased energy 3  Change in appetite 3  Feeling bad or failure about yourself  0  Trouble concentrating 1  Moving slowly or fidgety/restless 0  Suicidal thoughts 0  PHQ-9 Score 9  Difficult doing work/chores Not difficult at all     Assessment and Plan:   Other Fatigue Anita Holland does feel that her weight is causing her energy to be lower than it should be. Fatigue may be related to obesity, depression or many other causes. Labs will be ordered, and in the meanwhile, Anita Holland will focus on self care including making healthy food  choices, increasing physical activity and focusing on stress reduction.  Shortness of Breath Anita Holland does feel that she gets out of breath more easily that she used to when she exercises. Anita Holland's shortness of breath appears to be obesity related and exercise induced. She has agreed to work on weight loss and gradually increase exercise to treat her exercise induced shortness of breath. Will continue to monitor closely.  Anita Holland had a positive depression screening. Depression is  commonly associated with obesity and often results in emotional eating behaviors. We will monitor this closely and work on CBT to help improve the non-hunger eating patterns. Referral to Psychology may be required if no improvement is seen as she continues in our clinic.    Problem List Items Addressed This Visit     Vitamin D  deficiency Last vitamin D  Lab Results  Component Value Date   VD25OH 25.52 (L) 11/29/2023   Her vitamin D  level was recently done by primary care.  She is taking over-the-counter vitamin D  supplement but is not sure of her dose.  She will bring in her bottle next visit to review.  Recommend a vitamin D  level over 50 to help with energy level, bone health and immune function.    Primary hypertension Blood pressure is mildly elevated today.  She has not yet taken her dose of losartan /HCTZ 100/12.5 mg daily.  She is tolerating this well.  She does have a family history for hypertension but hopes to see improvements in her blood pressure with weight reduction.    Prediabetes Lab Results  Component Value Date   HGBA1C 5.9 11/29/2023  Her A1c was recently done by primary care at 5.9 in the prediabetic range.  She reports positive family history for type 2 diabetes and would like to see improvements in her A1c with weight reduction.  She has only recently started to reduce her intake of added sugar.  Begin prescribed plan as reviewed and think about options to add in more regular exercise.  She plans to start working out at [employer].  Check fasting insulin level today.    Relevant Orders   Insulin, random   Other Visit Diagnoses       SOBOE (shortness of breath on exertion)    -  Primary     Other fatigue       Relevant Orders   EKG 12-Lead (Completed)   Folate     Depressed mood   PHQ-9 today with high at 20.  She is not on any mood medications.  Stress levels remain fairly high.  She has some emotional eating tendencies.  Begin working on stress reduction and  mindful eating.  She is getting adequate sleep at night.  Consider use of Wellbutrin.       Anita Holland is currently in the action stage of change and her goal is to continue with weight loss efforts. I recommend Anita Holland begin the structured treatment plan as follows:  She has agreed to participate: Cat 2 meal plan + 200 additional snack calories  Exercise goals: All adults should avoid inactivity. Some activity is better than none, and adults who participate in any amount of physical activity, gain some health benefits.  Behavioral modification strategies:increasing lean protein intake, increase H2O intake, decrease liquid calories, increase high fiber foods, decreasing eating out, no skipping meals, meal planning and cooking strategies, keeping healthy foods in the home, better snacking choices, avoiding temptations, and planning for success  She was informed of the importance of frequent follow-up  visits to maximize her success with intensive lifestyle modifications for her multiple health conditions. She was informed we would discuss her lab results at her next visit unless there is a critical issue that needs to be addressed sooner. Anita Holland agreed to keep her next visit at the agreed upon time to discuss these results.  Objective:  General: Cooperative, alert, well developed, in no acute distress. HEENT: Conjunctivae and lids unremarkable. Cardiovascular: Regular rhythm.  Lungs: Normal work of breathing. Neurologic: No focal deficits.   Lab Results  Component Value Date   CREATININE 0.90 11/29/2023   BUN 14 11/29/2023   NA 143 11/29/2023   K 4.1 11/29/2023   CL 105 11/29/2023   CO2 28 11/29/2023   Lab Results  Component Value Date   ALT 23 11/29/2023   AST 20 11/29/2023   ALKPHOS 51 11/29/2023   BILITOT 0.7 11/29/2023   Lab Results  Component Value Date   HGBA1C 5.9 11/29/2023   HGBA1C 6.0 05/31/2023   HGBA1C 5.8 06/15/2022   HGBA1C 5.4 11/02/2021   No results found  for: INSULIN Lab Results  Component Value Date   TSH 1.190 07/30/2023   Lab Results  Component Value Date   CHOL 167 11/29/2023   HDL 39.50 11/29/2023   LDLCALC 73 11/29/2023   TRIG 272.0 (H) 11/29/2023   CHOLHDL 4 11/29/2023   Lab Results  Component Value Date   WBC 5.8 11/29/2023   HGB 14.7 11/29/2023   HCT 45.0 11/29/2023   MCV 87.6 11/29/2023   PLT 363.0 11/29/2023   Lab Results  Component Value Date   IRON 68 06/15/2022   TIBC 399 06/15/2022   FERRITIN 101 06/15/2022    Attestation Statements:  Reviewed by clinician on day of visit: allergies, medications, problem list, medical history, surgical history, family history, social history, and previous encounter notes.  Time spent on visit including pre-visit chart review and post-visit charting and face- to face care including nutritional counseling, review of EKG, interpretation of body composition scale and indirect calorimetry and nutrition prescription  was 40 minutes.   Micky Albee, D.O. DABFM, DABOM [facility] [address] [place], [place] [address] [phone]

## 2023-12-13 ENCOUNTER — Ambulatory Visit: Payer: Self-pay | Admitting: Family Medicine

## 2023-12-13 LAB — INSULIN, RANDOM: INSULIN: 18.6 u[IU]/mL (ref 2.6–24.9)

## 2023-12-13 LAB — FOLATE: Folate: 13.9 ng/mL (ref >3.0)

## 2023-12-26 ENCOUNTER — Encounter: Payer: Self-pay | Admitting: Family Medicine

## 2023-12-26 ENCOUNTER — Ambulatory Visit: Admitting: Family Medicine

## 2023-12-26 VITALS — BP 138/82 | HR 92 | Temp 98.3°F | Ht 62.5 in | Wt 200.0 lb

## 2023-12-26 DIAGNOSIS — E66812 Obesity, class 2: Secondary | ICD-10-CM

## 2023-12-26 DIAGNOSIS — E559 Vitamin D deficiency, unspecified: Secondary | ICD-10-CM

## 2023-12-26 DIAGNOSIS — K279 Peptic ulcer, site unspecified, unspecified as acute or chronic, without hemorrhage or perforation: Secondary | ICD-10-CM | POA: Diagnosis not present

## 2023-12-26 DIAGNOSIS — I10 Essential (primary) hypertension: Secondary | ICD-10-CM

## 2023-12-26 DIAGNOSIS — R7303 Prediabetes: Secondary | ICD-10-CM

## 2023-12-26 DIAGNOSIS — Z6836 Body mass index (BMI) 36.0-36.9, adult: Secondary | ICD-10-CM

## 2023-12-26 DIAGNOSIS — E88819 Insulin resistance, unspecified: Secondary | ICD-10-CM | POA: Diagnosis not present

## 2023-12-26 MED ORDER — PHENTERMINE-TOPIRAMATE ER 3.75-23 MG PO CP24: ORAL_CAPSULE | ORAL | 0 refills | Status: DC

## 2023-12-26 MED ORDER — PANTOPRAZOLE SODIUM 40 MG PO TBEC: 40.0000 mg | DELAYED_RELEASE_TABLET | Freq: Every day | ORAL | 0 refills | Status: AC

## 2023-12-26 NOTE — Progress Notes (Signed)
 Office: [phone]  /  Fax: [phone]  WEIGHT SUMMARY AND BIOMETRICS  Starting Date: 12/12/23  Starting Weight: 199lb   Weight Lost Since Last Visit: 0lb   Vitals Temp: 98.3 F (36.8 C) BP: 138/82 Pulse Rate: 92 SpO2: 96 %   Body Composition  Body Fat %: 44.3 % Fat Mass (lbs): 88.6 lbs Muscle Mass (lbs): 106 lbs Total Body Water (lbs): 75.2 lbs Visceral Fat Rating : 12     HPI  Chief Complaint: OBESITY  Cambreigh is here to discuss her progress with her obesity treatment plan. She is on the the Category 2 Plan and states she is following her eating plan approximately 70 % of the time. She states she is exercising 0 minutes 0 times per week.   Interval History:  Since last office visit she is up 1 lb She has been having to get up to eat late at night due to dyspepsia She did cut back on caffeine She did increase her water intake -- she was getting headaches, needing tylenol  at night She has been eating 2 eggs, no bread for breakfast, +/- berries She gets hungry at 10 am - has a banana or a Cutie She has been bringing lunches to work - frozen meals or a [place] sandwich and veggies She may have more fruit between lunch and dinner She tends to make bad choices at dinner if she eats out some of the time She has not started regular exercise  Pharmacotherapy: none  PHYSICAL EXAM:  Blood pressure 138/82, pulse 92, temperature 98.3 F (36.8 C), height 5' 2.5 (1.588 m), weight 200 lb (90.7 kg), SpO2 96%. Body mass index is 36 kg/m.  General: She is overweight, cooperative, alert, well developed, and in no acute distress. PSYCH: Has normal mood, affect and thought process.   Lungs: Normal breathing effort, no conversational dyspnea.   ASSESSMENT AND PLAN  TREATMENT PLAN FOR OBESITY:  Recommended Dietary Goals  Marykatherine is currently in the action stage of change. As such, her goal is to continue weight management plan. She has agreed to the Category 2  Plan and practicing portion control and making smarter food choices, such as increasing vegetables and decreasing simple carbohydrates. Reviewed dietary change goals on AVS with patient  Behavioral Intervention  We discussed the following Behavioral Modification Strategies today: increasing lean protein intake to established goals, increasing fiber rich foods, increasing water intake , work on meal planning and preparation, keeping healthy foods at home, work on managing stress, creating time for self-care and relaxation, avoiding temptations and identifying enticing environmental cues, planning for success, and continue to work on maintaining a reduced calorie state, getting the recommended amount of protein, incorporating whole foods, making healthy choices, staying well hydrated and practicing mindfulness when eating..  Additional resources provided today: NA  Recommended Physical Activity Goals  Abrina has been advised to work up to 150 minutes of moderate intensity aerobic activity a week and strengthening exercises 2-3 times per week for cardiovascular health, weight loss maintenance and preservation of muscle mass.   She has agreed to Think about enjoyable ways to increase daily physical activity and overcoming barriers to exercise and Increase physical activity in their day and reduce sedentary time (increase NEAT).  Pharmacotherapy changes for the treatment of obesity: start Qsymia 3.75/23 mg qAM BP/ HR [place] PDMP reviewed Informed consent signed  ASSOCIATED CONDITIONS ADDRESSED TODAY  Peptic ulcer disease She notes a hx of PUD, no longer on PPI but has been waking up  in the middle of the night with reflux and epigastric pain.  She denies vomitting, melena or hematochezia.  Needing to eat to relive pain which is negating her weight loss efforts.  Restart Pantoprazole  40 mg daily at night -     Pantoprazole  Sodium; Take 1 tablet (40 mg total) by mouth daily.  Dispense: 90 tablet;  Refill: 0  Class 2 severe obesity due to excess calories with serious comorbidity and body mass index (BMI) of 36.0 to 36.9 in adult (HCC) -     Phentermine-Topiramate ER; 1 capsule po with breakfast daily  Dispense: 30 capsule; Refill: 0 Continue to work on cat 2 meal plan with modifications as reviewed on AVS.  She wanted more variety and less sodium so we discussed using the Chat GPT app for meal ideas to reduce frozen meals, restaurant meals and lunch meat.    Insulin  resistance Last fasting insulin  high at 18.6, reviewed lab result with patient from last visit Explained how IR can contribute to hyperphagia, weight gain and type II diabetes She would like to avoid metformin and lacks insurance coverage for GLP-1 RA's.  Ramp up walking time and continue to limit sugar intake  Prediabetes Lab Results  Component Value Date   HGBA1C 5.9 11/29/2023   Look for improvements over the next 4 mos  Primary hypertension BP controlled on Hyzaar 100/12.5 mg daily Reduce high sodium foods Watch BP/ HR with the addition of Qsymia  Vitamin D  deficiency Last vitamin D  Lab Results  Component Value Date   VD25OH 25.52 (L) 11/29/2023  She is taking OTC vitamin D  2,000 international units  daily with her PCP.  Goal vitamin D  level is >50 to aid in energy level, bone health and immune function.  Increase to 4,000 international units  daily and repeat lab in 4 mos      She was informed of the importance of frequent follow up visits to maximize her success with intensive lifestyle modifications for her multiple health conditions.   ATTESTASTION STATEMENTS:  Reviewed by clinician on day of visit: allergies, medications, problem list, medical history, surgical history, family history, social history, and previous encounter notes pertinent to obesity diagnosis.   I have personally spent 40 minutes total time today in preparation, patient care, nutritional counseling and education,  and  documentation for this visit, including the following: review of most recent clinical lab tests, prescribing medications/ refilling medications, reviewing medical assistant documentation, review and interpretation of bioimpedence results.     Darice Haddock, D.O. DABFM, DABOM [facility] [address] [place], [place] [address] [phone]

## 2023-12-26 NOTE — Patient Instructions (Signed)
 Plan to start Qsymia (brand or generic) each morning  Allow a fresh fruit with eggs in the morning for breakfast (no bread) OR 1 English muffin  10 am bream [facility] protein bar or a low sugar Greek Yogurt   Lunch: bring lunch to work - limit frozen meals to 2 x a week - sandwiches/ salads (low sodium [place]) - no fried foods - keep calories ~400 calories 'chat GPT 400 calorie healthy lunch ideas'  Between lunch and dinner - pick something  off 100 cal snack list  Dinner- planning! - reducing dinners out to 2 x a week or less - chat GPT app -- '500-600 calorie healthy dinner ideas'  Hydrate well with water  Restart Protonix  for ulcer

## 2024-01-08 ENCOUNTER — Encounter: Payer: Self-pay | Admitting: *Deleted

## 2024-01-10 ENCOUNTER — Ambulatory Visit: Payer: Commercial Managed Care - HMO | Admitting: Urology

## 2024-01-16 ENCOUNTER — Telehealth: Payer: Self-pay | Admitting: Nurse Practitioner

## 2024-01-16 DIAGNOSIS — N644 Mastodynia: Secondary | ICD-10-CM

## 2024-01-16 NOTE — Telephone Encounter (Signed)
 Copied from CRM [id number]. Topic: Referral - Question >> Jan 16, 2024  2:01 PM Paige D wrote: Reason for CRM: Pt called stating she needs the order for her mamo. As she tried to schedule the appointment and they are saying there is no order in for her. Pt would like some one to reach outh in regards to this on going issue,

## 2024-01-22 NOTE — Telephone Encounter (Signed)
 I called and spoke with patient and she has pain in her right breast.

## 2024-01-22 NOTE — Addendum Note (Signed)
 Addended by: Breyden Jeudy A on: 01/22/2024 10:35 AM   Modules accepted: Orders

## 2024-01-22 NOTE — Telephone Encounter (Signed)
 Copied from CRM [id number]. Topic: Clinical - Request for Lab/Test Order >> Jan 22, 2024  9:51 AM Anita Holland wrote: Reason for CRM: Patient is calling to advise that she tried to schedule her screening mammography but was informed that the order needed to be changed to a diagnostic with breast ultrasound due to pain she was experiencing.

## 2024-01-23 ENCOUNTER — Telehealth: Payer: Self-pay | Admitting: *Deleted

## 2024-01-23 ENCOUNTER — Encounter: Payer: Self-pay | Admitting: Family Medicine

## 2024-01-23 ENCOUNTER — Ambulatory Visit: Admitting: Family Medicine

## 2024-01-23 VITALS — BP 110/78 | HR 103 | Temp 98.6°F | Ht 62.5 in | Wt 193.0 lb

## 2024-01-23 DIAGNOSIS — K279 Peptic ulcer, site unspecified, unspecified as acute or chronic, without hemorrhage or perforation: Secondary | ICD-10-CM

## 2024-01-23 DIAGNOSIS — E66812 Obesity, class 2: Secondary | ICD-10-CM

## 2024-01-23 DIAGNOSIS — Z6836 Body mass index (BMI) 36.0-36.9, adult: Secondary | ICD-10-CM

## 2024-01-23 DIAGNOSIS — E559 Vitamin D deficiency, unspecified: Secondary | ICD-10-CM | POA: Diagnosis not present

## 2024-01-23 DIAGNOSIS — E88819 Insulin resistance, unspecified: Secondary | ICD-10-CM

## 2024-01-23 DIAGNOSIS — R7303 Prediabetes: Secondary | ICD-10-CM

## 2024-01-23 NOTE — Telephone Encounter (Signed)
 Prior authorization done via cover my meds for patients Qsymia. Waiting on determination.

## 2024-01-23 NOTE — Patient Instructions (Addendum)
 Add sugar free electrolytes - 1 or 2 packs per day in your water (Propel, GZERO, sugar free Liquid IV)  Hydrate well with water  Great job with gym workouts!!  Take Pantoprazole  at bedtime daily  Remember your vitamin D  4,000 international units  daily

## 2024-01-23 NOTE — Progress Notes (Signed)
 Office: [phone]  /  Fax: [phone]  WEIGHT SUMMARY AND BIOMETRICS  Starting Date: 12/12/23  Starting Weight: 199lb   Weight Lost Since Last Visit: 7lb   Vitals Temp: 98.6 F (37 C) BP: 110/78 Pulse Rate: (!) 103 SpO2: 98 %   Body Composition  Body Fat %: 42.5 % Fat Mass (lbs): 82.4 lbs Muscle Mass (lbs): 105.8 lbs Total Body Water (lbs): 73.6 lbs Visceral Fat Rating : 12   HPI  Chief Complaint: OBESITY  Anita Holland is here to discuss her progress with her obesity treatment plan. She is on the the Category 2 Plan and states she is following her eating plan approximately 40 % of the time. She states she is exercising 45+ minutes 5 times per week.  Interval History:  Since last office visit she is down 7 lb She is down 0.2 pounds of muscle mass and down 6.2 pounds of body fat since last visit This gives her a net weight loss of 6 lb in 1 month of medically supervised weight management She is drinking more water and is eating more eggs She is focused on lean meats, more veggies, some fresh fruit She didn't start phentermine /topiramate  ER due to cost She has been working on cooking more meals at home and eating out less along with meal planning and better food choices Hunger and cravings are under good control  Pharmacotherapy: None  PHYSICAL EXAM:  Blood pressure 110/78, pulse (!) 103, temperature 98.6 F (37 C), height 5' 2.5 (1.588 m), weight 193 lb (87.5 kg), SpO2 98%. Body mass index is 34.74 kg/m.  General: She is overweight, cooperative, alert, well developed, and in no acute distress. PSYCH: Has normal mood, affect and thought process.   Lungs: Normal breathing effort, no conversational dyspnea.   ASSESSMENT AND PLAN  TREATMENT PLAN FOR OBESITY:  Recommended Dietary Goals  Anita Holland is currently in the action stage of change. As such, her goal is to continue weight management plan. She has agreed to the Category 2 Plan.  Behavioral  Intervention  We discussed the following Behavioral Modification Strategies today: increasing lean protein intake to established goals, increasing vegetables, increasing fiber rich foods, increasing water intake , work on meal planning and preparation, keeping healthy foods at home, decreasing eating out or consumption of processed foods, and making healthy choices when eating convenient foods, avoiding temptations and identifying enticing environmental cues, continue to practice mindfulness when eating, planning for success, and continue to work on maintaining a reduced calorie state, getting the recommended amount of protein, incorporating whole foods, making healthy choices, staying well hydrated and practicing mindfulness when eating..  Additional resources provided today: NA  Recommended Physical Activity Goals  Anita Holland has been advised to work up to 150 minutes of moderate intensity aerobic activity a week and strengthening exercises 2-3 times per week for cardiovascular health, weight loss maintenance and preservation of muscle mass.   She has agreed to Increase the intensity, frequency or duration of aerobic exercises   Aim for 30+ minutes of exercise 5 days a week  Pharmacotherapy changes for the treatment of obesity: None  ASSOCIATED CONDITIONS ADDRESSED TODAY  Peptic ulcer disease She continues to have symptomatic dyspepsia needing to wake up late at night to eat a snack due to epigastric pain.  She denies melena or hematochezia, nausea or vomiting.  She has not been taking her pantoprazole  40 mg at bedtime.  She was last seen by Dr. Albertus 03/01/2023 for treatment of PUD.  Recommend avoiding high  acid foods, resuming pantoprazole  40 mg at bedtime and if still symptomatic, we will get her back in with GI.  Class 2 severe obesity due to excess calories with serious comorbidity and body mass index (BMI) of 36.0 to 36.9 in adult [facility]) She did not start phentermine /topiramate  ER due to  cost.  Will run a prior authorization for brand-name Qsymia  and begin if needed.  Currently, her hunger and cravings are under good control and she is seeing weight reduction without use of antiobesity medications.  Reviewed results from her bioimpedance.  Congratulated her on the addition of regular exercise.  Vitamin D  deficiency Last vitamin D  Lab Results  Component Value Date   VD25OH 25.52 (L) 11/29/2023  She has started vitamin D  OTC 4000 IU once daily.  Encouraged her to continue taking this on a daily basis and recheck labs in 3 months.  Insulin  resistance Last fasting insulin  high at 18.6.  She is doing a great job with weight reduction the introduction of regular exercise and reducing added sugar intake.  Continue to work on healthy lifestyle changes as prescribed.  Increase walking x 30 minutes 5 days a week.  Plan to recheck fasting insulin  in the next 3 months  Consider the addition of metformin as needed  Prediabetes Lab Results  Component Value Date   HGBA1C 5.9 11/29/2023   Anticipate resolution of prediabetes with weight reduction, healthy lifestyle changes.  Recheck A1c in 3 months     She was informed of the importance of frequent follow up visits to maximize her success with intensive lifestyle modifications for her multiple health conditions.   ATTESTASTION STATEMENTS:  Reviewed by clinician on day of visit: allergies, medications, problem list, medical history, surgical history, family history, social history, and previous encounter notes pertinent to obesity diagnosis.   I have personally spent 30 minutes total time today in preparation, patient care, nutritional counseling and education,  and documentation for this visit, including the following: review of most recent clinical lab tests,  reviewing medical assistant documentation, review and interpretation of bioimpedence results.     Darice Haddock, D.O. DABFM, DABOM [facility] [address] [place], [place] [address] [phone]

## 2024-01-24 NOTE — Telephone Encounter (Signed)
 Her Qsymia  is denied.  Message from [employer]: Drug is not covered by plan

## 2024-01-26 ENCOUNTER — Other Ambulatory Visit

## 2024-01-26 ENCOUNTER — Encounter

## 2024-01-30 ENCOUNTER — Ambulatory Visit

## 2024-01-30 ENCOUNTER — Ambulatory Visit
Admission: RE | Admit: 2024-01-30 | Discharge: 2024-01-30 | Disposition: A | Discharge status: Home / Self Care | Source: Ambulatory Visit | Attending: Nurse Practitioner | Admitting: Nurse Practitioner

## 2024-01-30 DIAGNOSIS — N644 Mastodynia: Secondary | ICD-10-CM

## 2024-01-31 ENCOUNTER — Ambulatory Visit: Payer: Self-pay | Admitting: Nurse Practitioner

## 2024-02-05 ENCOUNTER — Ambulatory Visit: Payer: Self-pay

## 2024-02-05 NOTE — Telephone Encounter (Signed)
 FYI Only or Action Required?: FYI only for provider.  Patient was last seen in primary care on 01/23/2024 by Anita Darice BRAVO, DO.  Called Nurse Triage reporting Knee Pain.  Symptoms began several days ago.  Interventions attempted: OTC medications: Tylenol  and Ice/heat application.  Symptoms are: unchanged.  Triage Disposition: See PCP When Office is Open (Within 3 Days)  Patient/caregiver understands and will follow disposition?: Yes                             Copied from CRM [id number]. Topic: Clinical - Red Word Triage >> Feb 05, 2024 12:41 PM Frederich PARAS wrote: [facility] that prompted transfer to Nurse Triage: bad pain,   PT has been walking for about 2 mos, she does daily walks . her knees started hurting, both knees, right one is in more pain then the other, 2 knotts, showed up. she have iced it butits nt working and heated it taken tylenal but nothing is not working. Reason for Disposition  [1] MODERATE pain (e.g., interferes with normal activities, limping) AND [2] present > 3 days  Answer Assessment - Initial Assessment Questions 1. LOCATION and RADIATION: Where is the pain located?      Bilateral knees, worse in right knee 2. QUALITY: What does the pain feel like?  (e.g., sharp, dull, aching, burning)     Soreness along inside 3. SEVERITY: How bad is the pain? What does it keep you from doing?   (Scale 1-10; or mild, moderate, severe)     Rates pain a 9 4. ONSET: When did the pain start? Does it come and go, or is it there all the time?     Since Friday  5. RECURRENT: Have you had this pain before? If Yes, ask: When, and what happened then?     Denies 6. SETTING: Has there been any recent work, exercise or other activity that involved that part of the body?      States she has been trying to walk daily for 2 miles, denies other activity  7. AGGRAVATING FACTORS: What makes the knee pain worse? (e.g., walking, climbing stairs,  running)     Sitting down for long periods of time makes knees feel stiff  8. ASSOCIATED SYMPTOMS: Is there any swelling or redness of the knee?     States she is limping, denies redness, denies swelling  9. OTHER SYMPTOMS: Do you have any other symptoms? (e.g., calf pain, chest pain, difficulty breathing, fever)     Denies chest pain, denies difficulty breathing, denies fever  Protocols used: Knee Pain-A-AH

## 2024-02-05 NOTE — Telephone Encounter (Signed)
 Noted. Patient was scheduled for an appointment with Lauren for tomorrow.

## 2024-02-06 ENCOUNTER — Ambulatory Visit: Admitting: Nurse Practitioner

## 2024-02-06 ENCOUNTER — Encounter: Payer: Self-pay | Admitting: Nurse Practitioner

## 2024-02-06 VITALS — BP 122/88 | HR 99 | Temp 97.5°F | Ht 62.5 in | Wt 197.0 lb

## 2024-02-06 DIAGNOSIS — Z862 Personal history of diseases of the blood and blood-forming organs and certain disorders involving the immune mechanism: Secondary | ICD-10-CM | POA: Diagnosis not present

## 2024-02-06 DIAGNOSIS — M25562 Pain in left knee: Secondary | ICD-10-CM

## 2024-02-06 DIAGNOSIS — M25561 Pain in right knee: Secondary | ICD-10-CM | POA: Insufficient documentation

## 2024-02-06 MED ORDER — NAPROXEN 500 MG PO TABS: 500.0000 mg | ORAL_TABLET | Freq: Two times a day (BID) | ORAL | 0 refills | Status: DC

## 2024-02-06 NOTE — Assessment & Plan Note (Signed)
 She experiences bilateral knee pain, with the right knee more severely affected, likely due to tendinitis from increased activity. No significant effusion noted. Previous x-rays were unremarkable in 2020. Prescribe naproxen  500mg  twice daily with food for one week, then as needed. Order x-rays for both knees. Advise reducing physical activity, especially walking, for one week. Recommend using a compression sleeve during the day. Discuss glucosamine as a potential supplement.

## 2024-02-06 NOTE — Patient Instructions (Signed)
 It was great to see you!  I have ordered x-ray of your knees   Keep using ice as needed   Start naproxen  twice a day with food for 1 week, then as needed   Start wearing a compression sleeve on your knee   Let's follow-up if your symptoms worsen or don't improve.   Take care,  Tinnie Harada, NP

## 2024-02-06 NOTE — Progress Notes (Signed)
 [  Acute Office Visit  Subjective:     Patient ID: Anita Holland, female    DOB: 11-07-1965, 58 y.o.   MRN: [id number]  Chief Complaint  Patient presents with   Knee Pain    Bilateral knee pain-right hurts more    HPI Discussed the use of AI scribe software for clinical note transcription with the patient, who gave verbal consent to proceed.  History of Present Illness   Anita Holland is a 58 year old female who presents with bilateral knee pain.  She experiences severe bilateral knee pain, more pronounced in the right knee, primarily located behind the knee cap. The pain worsens with standing and walking, hindering these activities. There have been no recent falls or injuries. Her knee pain began after a recent walk, preventing her from continuing her walking routine.  She has attempted various treatments, including heat, ice, Tylenol , and topical creams, without relief.  There is a family history of sickle cell disease, with her son diagnosed after similar symptoms. Her daughter is awaiting test results for sickle cell. She has not been tested for sickle cell and is concerned about being a carrier.      ROS See pertinent positives and negatives per HPI.     Objective:    BP 122/88 (BP Location: Left Arm, Patient Position: Sitting, Cuff Size: Normal)   Pulse 99   Temp (!) 97.5 F (36.4 C)   Ht 5' 2.5 (1.588 m)   Wt 197 lb (89.4 kg)   LMP  (LMP Unknown)   SpO2 98%   BMI 35.46 kg/m  BP Readings from Last 3 Encounters:  02/06/24 122/88  01/23/24 110/78  12/26/23 138/82   Wt Readings from Last 3 Encounters:  02/06/24 197 lb (89.4 kg)  01/23/24 193 lb (87.5 kg)  12/26/23 200 lb (90.7 kg)      Physical Exam Vitals and nursing note reviewed.  Constitutional:      General: She is not in acute distress.    Appearance: Normal appearance.  HENT:     Head: Normocephalic.  Eyes:     Conjunctiva/sclera: Conjunctivae normal.  Pulmonary:     Effort: Pulmonary effort  is normal.  Musculoskeletal:        General: Tenderness (left and right medial knee) present. No swelling. Normal range of motion.     Cervical back: Normal range of motion.     Comments: Negative anterior/posterior drawer test bilaterally  Skin:    General: Skin is warm.  Neurological:     General: No focal deficit present.     Mental Status: She is alert and oriented to person, place, and time.  Psychiatric:        Mood and Affect: Mood normal.        Behavior: Behavior normal.        Thought Content: Thought content normal.        Judgment: Judgment normal.       Assessment & Plan:   Problem List Items Addressed This Visit       Other   Acute pain of both knees - Primary   She experiences bilateral knee pain, with the right knee more severely affected, likely due to tendinitis from increased activity. No significant effusion noted. Previous x-rays were unremarkable in 2020. Prescribe naproxen  500mg  twice daily with food for one week, then as needed. Order x-rays for both knees. Advise reducing physical activity, especially walking, for one week. Recommend using a compression sleeve during the day.  Discuss glucosamine as a potential supplement.      Relevant Orders   DG Knee Complete 4 Views Left   DG Knee Complete 4 Views Right   History of anemia   She has a history of anemia - likely due to heavy menses, however her son was diagnosed with sickle cell. She would like to be tested for this. Check CBC, iron, ferritin, and hemoglobin electropheresis today.       Relevant Orders   Hgb Fractionation Cascade   CBC with Differential/Platelet   Iron   Ferritin    Meds ordered this encounter  Medications   naproxen  (NAPROSYN ) 500 MG tablet    Sig: Take 1 tablet (500 mg total) by mouth 2 (two) times daily with a meal.    Dispense:  60 tablet    Refill:  0    Return if symptoms worsen or fail to improve.  Tinnie DELENA Harada, NP

## 2024-02-06 NOTE — Assessment & Plan Note (Signed)
 She has a history of anemia - likely due to heavy menses, however her son was diagnosed with sickle cell. She would like to be tested for this. Check CBC, iron, ferritin, and hemoglobin electropheresis today.

## 2024-02-07 ENCOUNTER — Ambulatory Visit (INDEPENDENT_AMBULATORY_CARE_PROVIDER_SITE_OTHER)

## 2024-02-07 DIAGNOSIS — M25562 Pain in left knee: Secondary | ICD-10-CM

## 2024-02-07 DIAGNOSIS — M25561 Pain in right knee: Secondary | ICD-10-CM

## 2024-02-07 LAB — CBC WITH DIFFERENTIAL/PLATELET
Basophils Absolute: 0 K/uL (ref 0.0–0.1)
Basophils Relative: 0.7 % (ref 0.0–3.0)
Eosinophils Absolute: 0.2 K/uL (ref 0.0–0.7)
Eosinophils Relative: 5.7 % — ABNORMAL HIGH (ref 0.0–5.0)
HCT: 45.3 % (ref 36.0–46.0)
Hemoglobin: 14.8 g/dL (ref 12.0–15.0)
Lymphocytes Relative: 40.6 % (ref 12.0–46.0)
Lymphs Abs: 1.5 K/uL (ref 0.7–4.0)
MCHC: 32.7 g/dL (ref 30.0–36.0)
MCV: 88.9 fl (ref 78.0–100.0)
Monocytes Absolute: 0.3 K/uL (ref 0.1–1.0)
Monocytes Relative: 9.2 % (ref 3.0–12.0)
Neutro Abs: 1.6 K/uL (ref 1.4–7.7)
Neutrophils Relative %: 43.8 % (ref 43.0–77.0)
Platelets: 292 K/uL (ref 150.0–400.0)
RBC: 5.1 Mil/uL (ref 3.87–5.11)
RDW: 14.9 % (ref 11.5–15.5)
WBC: 3.6 K/uL — ABNORMAL LOW (ref 4.0–10.5)

## 2024-02-07 LAB — IRON: Iron: 52 ug/dL (ref 42–145)

## 2024-02-07 LAB — FERRITIN: Ferritin: 69.9 ng/mL (ref 10.0–291.0)

## 2024-02-10 LAB — HGB FRACTIONATION CASCADE
Hgb A2: 2.8 % (ref 1.8–3.2)
Hgb A: 97.2 % (ref 96.4–98.8)
Hgb F: 0 % (ref 0.0–2.0)
Hgb S: 0 %

## 2024-02-11 ENCOUNTER — Ambulatory Visit: Payer: Self-pay | Admitting: Nurse Practitioner

## 2024-02-11 MED ORDER — PREDNISONE 20 MG PO TABS: 40.0000 mg | ORAL_TABLET | Freq: Every day | ORAL | 0 refills | Status: AC

## 2024-02-11 NOTE — Addendum Note (Signed)
 Addended by: Zaakirah Kistner A on: 02/11/2024 04:24 PM   Modules accepted: Orders

## 2024-02-11 NOTE — Telephone Encounter (Signed)
 I called radiology and asked for image of bilateral knee to be read.

## 2024-02-15 ENCOUNTER — Emergency Department (HOSPITAL_BASED_OUTPATIENT_CLINIC_OR_DEPARTMENT_OTHER)

## 2024-02-15 ENCOUNTER — Encounter (HOSPITAL_BASED_OUTPATIENT_CLINIC_OR_DEPARTMENT_OTHER): Payer: Self-pay

## 2024-02-15 ENCOUNTER — Ambulatory Visit: Payer: Self-pay

## 2024-02-15 ENCOUNTER — Emergency Department (HOSPITAL_BASED_OUTPATIENT_CLINIC_OR_DEPARTMENT_OTHER)
Admission: EM | Admit: 2024-02-15 | Discharge: 2024-02-15 | Disposition: A | Discharge status: Home / Self Care | Attending: Emergency Medicine | Admitting: Emergency Medicine

## 2024-02-15 ENCOUNTER — Other Ambulatory Visit: Payer: Self-pay

## 2024-02-15 DIAGNOSIS — Z79899 Other long term (current) drug therapy: Secondary | ICD-10-CM | POA: Insufficient documentation

## 2024-02-15 DIAGNOSIS — M25561 Pain in right knee: Secondary | ICD-10-CM | POA: Diagnosis present

## 2024-02-15 DIAGNOSIS — I10 Essential (primary) hypertension: Secondary | ICD-10-CM | POA: Diagnosis not present

## 2024-02-15 MED ORDER — PANTOPRAZOLE SODIUM 40 MG PO TBEC
40.0000 mg | DELAYED_RELEASE_TABLET | Freq: Once | ORAL | Status: AC
  Administered 2024-02-15: 40 mg via ORAL
  Filled 2024-02-15: qty 1

## 2024-02-15 MED ORDER — SUCRALFATE 1 G PO TABS
1.0000 g | ORAL_TABLET | Freq: Once | ORAL | Status: AC
  Administered 2024-02-15: 1 g via ORAL
  Filled 2024-02-15: qty 1

## 2024-02-15 MED ORDER — KETOROLAC TROMETHAMINE 60 MG/2ML IM SOLN
30.0000 mg | Freq: Once | INTRAMUSCULAR | Status: AC
  Administered 2024-02-15: 30 mg via INTRAMUSCULAR
  Filled 2024-02-15: qty 2

## 2024-02-15 NOTE — ED Triage Notes (Signed)
 Arrives POV with complaints of worsening right knee xray x2 weeks. Patient reports no falls or injuries. She was seen by here PCP and had xrays (negative) and prescribed PO meds with no relief. Pain is a 10/10.

## 2024-02-15 NOTE — Telephone Encounter (Signed)
 Copied from CRM [id number]. Topic: Clinical - Medical Advice >> Feb 15, 2024  9:20 AM Berneda FALCON wrote: Reason for CRM: Pt states she had an appt with [facility] on 8/13 about pain in her right knee. She had the Xray done which came back normal but she states the pain is just getting worse not better. Wants to know if there is any additional tests she can do? States the medications are not helping either.

## 2024-02-15 NOTE — Discharge Instructions (Signed)
 You have a bone spur in your tibia on your right leg.  This is likely causing your pain.  You may take 1000 mg of Tylenol  every 8 hours, apply ice to the area several times per day.  I have included the telephone number for a orthopedic doctor.  Please give them a call on Monday to set a follow-up appointment.

## 2024-02-15 NOTE — ED Notes (Signed)
 Declined to have further xrays in triage.

## 2024-02-15 NOTE — ED Provider Notes (Signed)
 [place] EMERGENCY DEPARTMENT AT [facility] Provider Note  CSN: [id number] Arrival date & time: 02/15/24 1318  Chief Complaint(s) Knee Pain (Right )  HPI Anita Holland is a 58 y.o. female who is here today for her persistent right knee pain.  Patient states that she first injured her knee about 2 weeks ago.  She has begun a exercise regiment based around walking at a local facility.  She has been doing laps.  Says that she started to feel little bit of discomfort in her leg, but felt as though she could tough through and completed 1 more LAT.  When she finished, she had quite a bit of pain in her knee.  She followed with her PCP who took x-rays that she was told were negative.  She has been taking Tylenol  at home.  Has a history of peptic ulcer disease.  No history of Gout.  No fever.  No other trauma to the knee.   Past Medical History Past Medical History:  Diagnosis Date   Allergy    Anemia    Arthritis    Back pain    Blood transfusion without reported diagnosis    Fibroid    GERD (gastroesophageal reflux disease)    Hypertension    Joint pain    LGSIL of cervix of undetermined significance 06/2017   Few cells suggesting high-grade lesion.  Colposcopy showed inflammatory changes.  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 Authorizing Provider  acetaminophen  (TYLENOL ) 500 MG tablet Take by mouth. 10/29/19   [provider]  losartan -hydrochlorothiazide  (HYZAAR) 100-12.5 MG tablet Take 1 tablet by mouth daily. 11/29/23 11/23/24  McElwee, Tinnie LABOR, NP  Multiple Vitamins-Minerals (CENTRUM FRESH/FRUITY 50+) CHEW Chew 1 tablet by mouth daily. 10/29/19   [provider]  naproxen  (NAPROSYN ) 500 MG tablet Take 1 tablet (500 mg total) by mouth 2 (two) times daily with a meal. 02/06/24   McElwee, Lauren A, NP  pantoprazole  (PROTONIX ) 40 MG tablet Take 1 tablet (40 mg total) by mouth daily. 12/26/23   Bowen, Darice BRAVO, DO  predniSONE  (DELTASONE ) 20 MG tablet Take 2 tablets (40 mg total) by mouth daily with breakfast. 02/11/24   Nedra Tinnie LABOR, NP  Past Surgical History Past Surgical History:  Procedure Laterality Date   DILATION AND CURETTAGE OF UTERUS     ESOPHAGOGASTRODUODENOSCOPY N/A 10/30/2017   Procedure: ESOPHAGOGASTRODUODENOSCOPY (EGD);  Surgeon: Nandigam, Kavitha V, MD;  Location: [facility];  Service: Endoscopy;  Laterality: N/A;   EXTRACORPOREAL SHOCK WAVE LITHOTRIPSY Right 05/07/2023   Procedure: EXTRACORPOREAL SHOCK WAVE LITHOTRIPSY (ESWL);  Surgeon: Roseann Adine PARAS., MD;  Location: [facility];  Service: Urology;  Laterality:  Right;   Family History Family History  Problem Relation Age of Onset   Stroke Sister 74   Hypertension Sister    Depression Sister    Heart attack Sister 65   Colon cancer Neg Hx    Colon polyps Neg Hx    Esophageal cancer Neg Hx    Rectal cancer Neg Hx    Stomach cancer Neg Hx    Breast cancer Neg Hx     Social History Social History   Tobacco Use   Smoking status: Never   Smokeless tobacco: Never  Vaping Use   Vaping status: Never Used  Substance Use Topics   Alcohol use: No   Drug use: No   Allergies Patient has no known allergies.  Review of Systems Review of Systems  Physical Exam Vital Signs  I have reviewed the triage vital signs BP 124/87 (BP Location: Left Arm)   Pulse 93   Temp 98.1 F (36.7 C) (Oral)   Resp 20   Ht 5' 2.5 (1.588 m)   Wt 88.5 kg   LMP  (LMP Unknown)   SpO2 100%   BMI 35.10 kg/m   Physical Exam Vitals and nursing note reviewed.  Constitutional:      Appearance: She is not ill-appearing.  Cardiovascular:     Rate and Rhythm: Normal rate.  Pulmonary:     Effort: Pulmonary effort is normal.  Abdominal:     General: Abdomen is flat.  Musculoskeletal:     Comments: Right knee-no swelling, no erythema, no warmth.  Patient with pain with active and passive range of motion of the right knee.  Negative anterior and posterior drawer test.  No knee instability.  No mass noted.  No fluctuance in the popliteal fossa.  No crepitus.  Neurological:     Mental Status: She is alert.     ED Results and Treatments Labs (all labs ordered are listed, but only abnormal results are displayed) Labs Reviewed - No data to display                                                                                                                        Radiology CT Knee Right Wo Contrast Result Date: 02/15/2024 CLINICAL DATA:  Right knee pain with limited range of motion, injury while walking 02/01/2024 EXAM: CT OF THE RIGHT KNEE WITHOUT CONTRAST  TECHNIQUE: Multidetector CT imaging of the right knee was performed according to the standard protocol. Multiplanar CT image reconstructions were also generated. RADIATION DOSE REDUCTION: This exam  was performed according to the departmental dose-optimization program which includes automated exposure control, adjustment of the mA and/or kV according to patient size and/or use of iterative reconstruction technique. COMPARISON:  Radiographs 02/07/2024 FINDINGS: Bones/Joint/Cartilage 5 by 3 by 6 mm fragmented spur of the tibial spine observed on image 38 series 8, no displacement to suggest that this is a truly loose fragment. Minimal spurring at the quadriceps attachment site of the patella. No acute fracture or knee effusion identified. Ligaments Suboptimally assessed by CT. Muscles and Tendons Unremarkable Soft tissues Unremarkable IMPRESSION: 1. 5 by 3 by 6 mm fragmented spur of the tibial spine, no displacement to suggest that this is a truly loose fragment. 2. Minimal spurring at the quadriceps attachment site of the patella. 3. No acute fracture or knee effusion identified. Electronically Signed   By: Ryan Salvage M.D.   On: 02/15/2024 16:14    Pertinent labs & imaging results that were available during my care of the patient were reviewed by me and considered in my medical decision making (see MDM for details).  Medications Ordered in ED Medications  sucralfate  (CARAFATE ) tablet 1 g (1 g Oral Given 02/15/24 1646)  pantoprazole  (PROTONIX ) EC tablet 40 mg (40 mg Oral Given 02/15/24 1646)  ketorolac  (TORADOL ) injection 30 mg (30 mg Intramuscular Given 02/15/24 1646)                                                                                                                                     Procedures Procedures  (including critical care time)  Medical Decision Making / ED Course   This patient presents to the ED for concern of knee pain, this involves an extensive number of treatment  options, and is a complaint that carries with it a high risk of complications and morbidity.  The differential diagnosis includes arthritis, meniscus injury, occult fracture, ligamentous injury, less likely infection, less likely gout.  MDM: Patient is knee overall normal in appearance.  No infectious symptoms.  Will obtain CT imaging to see if there is an occult fracture.  Suspicion is likely for worsening arthritis.  No indication for arthrocentesis here in the ED.  Believe there is low risk and a one-time dose of Toradol  here in the ED to help out with her pain and discomfort, would not recommend this patient to have this for a prolonged period.  Will also give her some Protonix  and Carafate  here in the ED with her Toradol .  Reassessment 5 PM-patient CT imaging shows fragmented bone spur.  Likely cause of her symptoms.  Will provide orthopedic follow-up.  Additional history obtained: -Additional history obtained from  -External records from outside source obtained and reviewed including: Chart review including previous notes, labs, imaging, consultation notes   Lab Tests: -I ordered, reviewed, and interpreted labs.   The pertinent results include:   Labs Reviewed - No data to display  Imaging Studies ordered: I ordered imaging studies including CT of knee I independently visualized and interpreted imaging. I agree with the radiologist interpretation   Medicines ordered and prescription drug management: Meds ordered this encounter  Medications   sucralfate  (CARAFATE ) tablet 1 g   pantoprazole  (PROTONIX ) EC tablet 40 mg   ketorolac  (TORADOL ) injection 30 mg    -I have reviewed the patients home medicines and have made adjustments as needed   Cardiac Monitoring: The patient was maintained on a cardiac monitor.  I personally viewed and interpreted the cardiac monitored which showed an underlying rhythm of: Normal sinus rhythm  Social Determinants of Health:  Factors  impacting patients care include: Lack of access to primary care   Reevaluation: After the interventions noted above, I reevaluated the patient and found that they have :improved  Co morbidities that complicate the patient evaluation  Past Medical History:  Diagnosis Date   Allergy    Anemia    Arthritis    Back pain    Blood transfusion without reported diagnosis    Fibroid    GERD (gastroesophageal reflux disease)    Hypertension    Joint pain    LGSIL of cervix of undetermined significance 06/2017   Few cells suggesting high-grade lesion.  Colposcopy showed inflammatory changes.  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  Recommend follow-up Pap smear 1 year   Peptic ulcer    Upper GI bleed 10/28/2017   Vaginal Pap smear, abnormal       Dispostion: I considered admission for this patient, however she is appropriate for discharge.     Final Clinical Impression(s) / ED Diagnoses Final diagnoses:  Acute pain of right knee     @PCDICTATION @    Mannie Pac T, DO 02/15/24 1712

## 2024-02-15 NOTE — Telephone Encounter (Signed)
 Patient advised by nurse triage to go to the ED.

## 2024-02-15 NOTE — Telephone Encounter (Signed)
 FYI Only or Action Required?: FYI only for provider.  Patient was last seen in primary care on 02/06/2024 by Nedra Tinnie LABOR, NP.  Called Nurse Triage reporting Knee Pain.  Symptoms began several weeks ago.  Interventions attempted: Prescription medications: Naproxen , prednisone  and Rest, hydration, or home remedies.  Symptoms are: unchanged.  Triage Disposition: See HCP Within 4 Hours (Or PCP Triage)  Patient/caregiver understands and will follow disposition?: Yes  Copied from CRM [id number]. Topic: Clinical - Red Word Triage >> Feb 15, 2024  9:22 AM Berneda FALCON wrote: Red Word that prompted transfer to Nurse Triage: Patient states the pain in her right knee is worsening, can't walk or sleep. This has been ongoing for 2 weeks now. Saw McElwee on 8/13 and got the Xray done which came back normal and was prescribed 2 meds which are not helping. Reason for Disposition  [1] SEVERE pain (e.g., excruciating, unable to walk) AND [2] not improved after 2 hours of pain medicine  Answer Assessment - Initial Assessment Questions 1. LOCATION and RADIATION: Where is the pain located?      Right knee 2. QUALITY: What does the pain feel like?  (e.g., sharp, dull, aching, burning)     sore 3. SEVERITY: How bad is the pain? What does it keep you from doing?   (Scale 1-10; or mild, moderate, severe)     10-difficulty with walking 4. ONSET: When did the pain start? Does it come and go, or is it there all the time?     Going on for 2 weeks consistently 5. RECURRENT: Have you had this pain before? If Yes, ask: When, and what happened then?     no 6. SETTING: Has there been any recent work, exercise or other activity that involved that part of the body?      Patient reports she has recently started walking prior to the knee pain starting.  7. AGGRAVATING FACTORS: What makes the knee pain worse? (e.g., walking, climbing stairs, running)     Any type of movement is causing discomfort.   8. ASSOCIATED SYMPTOMS: Is there any swelling or redness of the knee?     no 9. OTHER SYMPTOMS: Do you have any other symptoms? (e.g., calf pain, chest pain, difficulty breathing, fever)     No  Patient endorses she is having more difficulty walking around-having extreme difficulty lifting up her right foot due to pain in right knee. Patient states she is having to hold on to something to move around at all. Patient recommended to emergency department.  Protocols used: Knee Pain-A-AH

## 2024-02-18 ENCOUNTER — Encounter: Payer: Self-pay | Admitting: Orthopedic Surgery

## 2024-02-18 ENCOUNTER — Ambulatory Visit (INDEPENDENT_AMBULATORY_CARE_PROVIDER_SITE_OTHER): Admitting: Physician Assistant

## 2024-02-18 ENCOUNTER — Encounter (HOSPITAL_BASED_OUTPATIENT_CLINIC_OR_DEPARTMENT_OTHER): Payer: Self-pay | Admitting: Physician Assistant

## 2024-02-18 DIAGNOSIS — M25561 Pain in right knee: Secondary | ICD-10-CM | POA: Diagnosis not present

## 2024-02-18 MED ORDER — TRAMADOL HCL 50 MG PO TABS: 50.0000 mg | ORAL_TABLET | Freq: Two times a day (BID) | ORAL | 0 refills | Status: DC | PRN

## 2024-02-18 MED ORDER — NAPROXEN 500 MG PO TABS: 500.0000 mg | ORAL_TABLET | Freq: Two times a day (BID) | ORAL | 0 refills | Status: AC

## 2024-02-18 NOTE — Progress Notes (Signed)
 Office Visit Note   Patient: Anita Holland           Date of Birth: [birth year]           MRN: [id number] Visit Date: 02/18/2024              Requested by: Nedra Tinnie LABOR, NP [address] [place],  [place] [address] PCP: Nedra Tinnie LABOR, NP   Assessment & Plan: Visit Diagnoses:  1. 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  She comes in today with a 3-week history of right knee pain.  She denies any particular injury but was walking laps and did 1 more lap and began noticing significant pain afterwards.  She has been followed by her primary care physician who has tried a short course of prednisone  as well as Naprosyn  without any significant relief in her symptoms she denies any fever or chills.  She did have x-rays and a CAT scan she has no evidence of any infective process or an effusion.  She is extremely sensitive to exam today.  She has to use crutches to help her walk.  We will give her some tramadol  which was already prescribed by her primary care I will also refill her Naprosyn  but I am somewhat concerned about Naprosyn  use long-term as well as steroid because of her history of GI issues.  I told her to take it with food if she has any issues to stop it immediately.  She seems to have tolerated okay so far.  She will take tramadol  sparingly.  Will order an MRI stat of the right knee she does have on CAT scan a loose body not sure if this is contributing to the problem or evidence of an avulsion injury.  Very difficult to examine today also provided her with a knee brace should go to the emergency room if symptoms increase  Follow-Up Instructions: Return in about 1 week (around 02/25/2024).   Orders:  Orders Placed This Encounter  Procedures   MR Knee Right Wo Contrast   Meds ordered this encounter  Medications   naproxen  (NAPROSYN ) 500 MG tablet    Sig: Take 1 tablet (500 mg total) by mouth 2 (two) times  daily with a meal.    Dispense:  60 tablet    Refill:  0      Procedures: No procedures performed   Clinical Data: No additional findings.   Subjective: Chief Complaint  Patient presents with   Right Knee - Pain    HPI patient is a pleasant 58 year old woman who comes in today with a chief complaint of 3-week history of right knee pain.  She denies any calf pain she denies any fever or chills she denies any previous injury.  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  She has recently had a CT scan which shows she has a small loose body of the tibial spine but no other significant abnormalities though obviously does not evaluate for any ligamentous issues.  She denies any calf pain.  Review of Systems  All other systems reviewed and are negative.    Objective: Vital Signs: LMP  (LMP Unknown)   Physical Exam Constitutional:      Appearance: Normal appearance.  Pulmonary:     Effort: Pulmonary effort is normal.  Skin:  General: Skin is warm and dry.  Neurological:     General: No focal deficit present.     Mental Status: She is alert and oriented to person, place, and time.  Psychiatric:        Mood and Affect: Mood normal.        Behavior: Behavior normal.     Ortho Exam Examination of her right knee she is has no redness no effusion.  She has no swelling.  She has good extension and flexion of her ankle and no pain with passive stressing and no pain or tenderness in her calf or ankle.  Focally extremely tender to the anterior knee medially.  Difficult to examine secondary to pain. Specialty Comments:  No specialty comments available.  Imaging: No results found.   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  Colposcopy showed inflammatory changes.  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  Recommend follow-up Pap smear 1 year   Peptic ulcer    Upper GI bleed 10/28/2017   Vaginal Pap smear, abnormal     Family History  Problem Relation Age of Onset   Stroke Sister 46   Hypertension Sister    Depression Sister    Heart attack Sister 87   Colon cancer Neg Hx    Colon polyps Neg Hx    Esophageal cancer Neg Hx    Rectal cancer Neg Hx    Stomach cancer Neg Hx    Breast cancer Neg Hx     Past Surgical History:  Procedure Laterality Date   DILATION AND CURETTAGE OF UTERUS     ESOPHAGOGASTRODUODENOSCOPY N/A 10/30/2017   Procedure: ESOPHAGOGASTRODUODENOSCOPY (EGD);  Surgeon: Nandigam, Kavitha V, MD;  Location: [facility];  Service: Endoscopy;  Laterality: N/A;   EXTRACORPOREAL SHOCK WAVE LITHOTRIPSY Right 05/07/2023   Procedure:  EXTRACORPOREAL SHOCK WAVE LITHOTRIPSY (ESWL);  Surgeon: Roseann Adine PARAS., MD;  Location: [facility];  Service: Urology;  Laterality: Right;   Social History   Occupational History   Not on file  Tobacco Use   Smoking status: Never   Smokeless tobacco: Never  Vaping Use   Vaping status: Never Used  Substance and  Sexual Activity   Alcohol use: No   Drug use: No   Sexual activity: Not Currently    Birth control/protection: Condom    Comment: 1st intercourse 15yo-5 partners

## 2024-02-18 NOTE — Telephone Encounter (Signed)
 Copied from CRM [id number]. Topic: General - Other >> Feb 18, 2024  9:25 AM Anita Holland wrote: Reason for CRM: patient called stating she was seen on Friday at [facility] and the medicine that she was given did help but it wore off at 12am. Patient want something called in for pain. The patient has been in pain for three weeks and Tylenol  is not working. The medicine that NP McElwee gave her also does not help either CB [phone]

## 2024-02-18 NOTE — Addendum Note (Signed)
 Addended by: Kupono Marling A on: 02/18/2024 11:58 AM   Modules accepted: Orders

## 2024-02-20 ENCOUNTER — Ambulatory Visit: Admitting: Family Medicine

## 2024-02-20 ENCOUNTER — Ambulatory Visit (HOSPITAL_BASED_OUTPATIENT_CLINIC_OR_DEPARTMENT_OTHER): Admitting: Physician Assistant

## 2024-02-22 ENCOUNTER — Telehealth (HOSPITAL_BASED_OUTPATIENT_CLINIC_OR_DEPARTMENT_OTHER): Payer: Self-pay | Admitting: Physician Assistant

## 2024-02-22 ENCOUNTER — Other Ambulatory Visit: Payer: Self-pay | Admitting: Nurse Practitioner

## 2024-02-22 MED ORDER — TRAMADOL HCL 50 MG PO TABS: 50.0000 mg | ORAL_TABLET | Freq: Two times a day (BID) | ORAL | 0 refills | Status: DC | PRN

## 2024-02-22 NOTE — Telephone Encounter (Signed)
 Copied from CRM #[id number]. Topic: Clinical - Medication Question >> Feb 22, 2024  1:09 PM Aisha D wrote: Reason for CRM: Pt is calling to check the status of the refill request for the traMADol  (ULTRAM ) 50 MG tablet. I informed the pt that its currently pending. Pt would like for the refill request to be approved today and would like a callback with an update.

## 2024-02-22 NOTE — Telephone Encounter (Signed)
 Anita Holland, will you please call patient and let her know I have faxed in more office notes to try and get the MRI approved?  Thanks!

## 2024-02-22 NOTE — Telephone Encounter (Signed)
 Contacted patient and advised that paper work has been sent to [employer] for her MRI

## 2024-02-22 NOTE — Telephone Encounter (Signed)
 Copied from CRM [id number]. Topic: Clinical - Medication Refill >> Feb 22, 2024  9:03 AM Deaijah H wrote: Medication: traMADol  (ULTRAM ) 50 MG tablet / need more until approval from insurance for MRI    Has the patient contacted their pharmacy? No (Agent: If no, request that the patient contact the pharmacy for the refill. If patient does not wish to contact the pharmacy document the reason why and proceed with request.) (Agent: If yes, when and what did the pharmacy advise?)  This is the patient's preferred pharmacy:  [id number] - [place], [place] - [address] AT [facility] [address] [place] [address] Phone: [phone]  Fax: [phone]     Is this the correct pharmacy for this prescription? Yes If no, delete pharmacy and type the correct one.   Has the prescription been filled recently? Yes  Is the patient out of the medication? No, 2 left  Has the patient been seen for an appointment in the last year OR does the patient have an upcoming appointment? Yes  Can we respond through MyChart? Yes  Agent: Please be advised that Rx refills may take up to 3 business days. We ask that you follow-up with your pharmacy.

## 2024-02-22 NOTE — Telephone Encounter (Signed)
 Patient wants to know if the paper work has been sent to [employer] in order for her to get her MRI done. Patient states the [facility] will not tell her what they need for Mri. Please advise

## 2024-02-22 NOTE — Telephone Encounter (Signed)
 Requesting: Tramadol   Last Visit: 02/06/2024 Next Visit: Visit date not found Last Refill: 02/18/2024 patient only has a few left  Please Advise

## 2024-02-27 ENCOUNTER — Other Ambulatory Visit: Payer: Self-pay | Admitting: Nurse Practitioner

## 2024-02-27 NOTE — Telephone Encounter (Signed)
 Requesting: TRAMADOL  HCL 50 MG TABLET  Last Visit: 02/06/2024 Next Visit: Visit date not found Last Refill: 02/22/2024  Please Advise

## 2024-02-29 ENCOUNTER — Ambulatory Visit (HOSPITAL_BASED_OUTPATIENT_CLINIC_OR_DEPARTMENT_OTHER)
Admission: RE | Admit: 2024-02-29 | Discharge: 2024-02-29 | Disposition: A | Discharge status: Home / Self Care | Source: Ambulatory Visit | Attending: Physician Assistant | Admitting: Physician Assistant

## 2024-02-29 DIAGNOSIS — M25561 Pain in right knee: Secondary | ICD-10-CM | POA: Diagnosis present

## 2024-03-04 ENCOUNTER — Ambulatory Visit (HOSPITAL_BASED_OUTPATIENT_CLINIC_OR_DEPARTMENT_OTHER): Admitting: Student

## 2024-03-04 ENCOUNTER — Ambulatory Visit (INDEPENDENT_AMBULATORY_CARE_PROVIDER_SITE_OTHER): Admitting: Student

## 2024-03-04 DIAGNOSIS — M84361A Stress fracture, right tibia, initial encounter for fracture: Secondary | ICD-10-CM

## 2024-03-04 DIAGNOSIS — M25561 Pain in right knee: Secondary | ICD-10-CM

## 2024-03-04 MED ORDER — HYDROCODONE-ACETAMINOPHEN 5-325 MG PO TABS: 1.0000 | ORAL_TABLET | Freq: Four times a day (QID) | ORAL | 0 refills | Status: AC | PRN

## 2024-03-04 NOTE — Progress Notes (Signed)
 Chief Complaint: Right knee pain    Discussed the use of AI scribe software for clinical note transcription with the patient, who gave verbal consent to proceed.  History of Present Illness Anita Holland is a 58 year old female who presents with right knee pain and swelling.  She is here today for MRI review.  She has experienced right knee pain and swelling since August 8th, following the start of a walking regimen of two miles a day. The pain is severe, worsens with movement, and swelling increases with activity. Heat, ice, and Tylenol  have not provided significant relief. A knee brace was used, but she feels instability without it. Imaging studies revealed a bone fragment.  The pain affects her daily activities, including walking and driving. Tramadol  is ineffective unless combined with ibuprofen, but she is concerned about ibuprofen due to her history of peptic ulcers. She takes 200 mg of ibuprofen, four tablets at a time, for some relief. She lives alone, finds daily tasks challenging due to knee pain, and uses a cane for mobility.   Surgical History:   None  PMH/PSH/Family History/Social History/Meds/Allergies:    Past Medical History:  Diagnosis Date   Allergy    Anemia    Arthritis    Back pain    Blood transfusion without reported diagnosis    Fibroid    GERD (gastroesophageal reflux disease)    Hypertension    Joint pain    LGSIL of cervix of undetermined significance 06/2017   Few cells suggesting high-grade lesion.  Colposcopy showed inflammatory changes.  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  Recommend follow-up Pap smear 1 year   Peptic ulcer    Upper GI bleed 10/28/2017   Vaginal Pap smear, abnormal    Past Surgical History:  Procedure Laterality Date   DILATION AND CURETTAGE OF UTERUS     ESOPHAGOGASTRODUODENOSCOPY N/A 10/30/2017   Procedure: ESOPHAGOGASTRODUODENOSCOPY (EGD);  Surgeon: Nandigam, Kavitha V, MD;  Location: [facility];  Service: Endoscopy;  Laterality: N/A;   EXTRACORPOREAL SHOCK WAVE LITHOTRIPSY Right 05/07/2023   Procedure: EXTRACORPOREAL SHOCK WAVE LITHOTRIPSY (ESWL);  Surgeon: Roseann Adine PARAS., MD;  Location: [facility] [place];  Service: Urology;  Laterality: Right;   Social History   Socioeconomic History   Marital status: Single    Spouse name: Not on file   Number of children: 8   Years of education: Not on file   Highest education level: Not on file  Occupational History   Not on file  Tobacco Use   Smoking status: Never   Smokeless tobacco: Never  Vaping Use   Vaping status: Never Used  Substance and Sexual Activity   Alcohol use: No   Drug use: No   Sexual activity: Not Currently    Birth control/protection: Condom    Comment: 1st intercourse 15yo-5 partners  Other Topics Concern   Not on file  Social History Narrative   Not on file   Social Drivers of Health   Financial Resource Strain: Not on file  Food Insecurity: No Food Insecurity (07/05/2021)   Received from [facility]   Hunger Vital Sign    Within the past 12 months, you worried that your food would run out before you got the money to buy more.: Never true    Within the past 12 months,  the food you bought just didn't last and you didn't have money to get more.: Never true  Transportation Needs: Not on file  Physical Activity: Not on file  Stress: Not on file  Social Connections: Unknown (11/07/2021)   Received from [facility]   Social Network    Social Network: Not on file   Family History  Problem Relation Age of Onset   Stroke Sister 55   Hypertension Sister    Depression Sister    Heart attack Sister 39   Colon cancer Neg Hx    Colon polyps Neg Hx    Esophageal cancer Neg Hx    Rectal cancer Neg Hx    Stomach cancer Neg Hx    Breast cancer Neg Hx    No Known Allergies Current Outpatient Medications  Medication Sig Dispense Refill   HYDROcodone -acetaminophen   (NORCO/VICODIN) 5-325 MG tablet Take 1 tablet by mouth every 6 (six) hours as needed for moderate pain (pain score 4-6). 30 tablet 0   acetaminophen  (TYLENOL ) 500 MG tablet Take by mouth.     losartan -hydrochlorothiazide  (HYZAAR) 100-12.5 MG tablet Take 1 tablet by mouth daily. 90 tablet 3   Multiple Vitamins-Minerals (CENTRUM FRESH/FRUITY 50+) CHEW Chew 1 tablet by mouth daily.     naproxen  (NAPROSYN ) 500 MG tablet Take 1 tablet (500 mg total) by mouth 2 (two) times daily with a meal. 60 tablet 0   pantoprazole  (PROTONIX ) 40 MG tablet Take 1 tablet (40 mg total) by mouth daily. 90 tablet 0   predniSONE  (DELTASONE ) 20 MG tablet Take 2 tablets (40 mg total) by mouth daily with breakfast. 10 tablet 0   traMADol  (ULTRAM ) 50 MG tablet Take 1 tablet (50 mg total) by mouth every 12 (twelve) hours as needed for severe pain (pain score 7-10). 10 tablet 0   No current facility-administered medications for this visit.   No results found.  Review of Systems:   A ROS was performed including pertinent positives and negatives as documented in the HPI.  Physical Exam :   Constitutional: NAD and appears stated age Neurological: Alert and oriented Psych: Appropriate affect and cooperative There were no vitals taken for this visit.   Comprehensive Musculoskeletal Exam:    Patient has a cane for assistance with ambulation although is essentially nonweightbearing due to pain.  There is tenderness in the medial right knee particularly over the proximal tibia.  Able to perform active range of motion from 0 to 90 degrees without resistance.  Imaging:   MRI right knee: Stress fracture in the medial proximal tibia with surrounding marrow edema.  There is some extrusion of the medial meniscus but no meniscal or ligamentous tearing.   I personally reviewed and interpreted the radiographs.      Assessment & Plan Right tibial stress fracture  Review of patient's MRI today does unfortunately show a pretty  notable stress fracture at the medial proximal tibia, which does correlate to the area of her symptoms.  I believe this was likely caused by her recent increase in physical activity, and there may be contribution from some extrusion of the medial meniscus placing increased stress on the tibia.  Patient's symptoms continue to be debilitating, and she has gotten little relief thus far.  In order to promote healing and recovery, I believe she needs to be on significantly restricted weightbearing.  Discussed I would ideally like to make her nonweightbearing for 3 to 4 weeks and discussed use of crutches, walker, or knee scooter, however this does  seem to be a barrier given her independency.  Given this I recommend only partial weightbearing when necessary.  She can continue use of the brace.  I will also trial a short course of hydrocodone  as she reports little relief with tramadol  but patient understands that I cannot provide refills.  Will have her scheduled for follow-up in 3 to 4 weeks with myself or Ronal Dragon.       I personally saw and evaluated the patient, and participated in the management and treatment plan.  Leonce Reveal, PA-C Orthopedics

## 2024-03-21 ENCOUNTER — Ambulatory Visit (INDEPENDENT_AMBULATORY_CARE_PROVIDER_SITE_OTHER): Admitting: Physician Assistant

## 2024-03-21 ENCOUNTER — Encounter: Payer: Self-pay | Admitting: Physician Assistant

## 2024-03-21 ENCOUNTER — Other Ambulatory Visit (INDEPENDENT_AMBULATORY_CARE_PROVIDER_SITE_OTHER): Payer: Self-pay

## 2024-03-21 DIAGNOSIS — M84361A Stress fracture, right tibia, initial encounter for fracture: Secondary | ICD-10-CM

## 2024-03-21 DIAGNOSIS — M84361D Stress fracture, right tibia, subsequent encounter for fracture with routine healing: Secondary | ICD-10-CM | POA: Diagnosis not present

## 2024-03-21 NOTE — Progress Notes (Signed)
 Office Visit Note   Patient: Anita Holland           Date of Birth: 07-09-1965           MRN: [id number] Visit Date: 03/21/2024              Requested by: Nedra Tinnie LABOR, NP [address] [place],  [place] [address] PCP: Nedra Tinnie LABOR, NP   Assessment & Plan: Visit Diagnoses:  1. Stress fracture of right tibia, initial encounter   2. Stress fracture of right tibia with routine healing, subsequent encounter     Plan: Patient is a pleasant 58 year old woman who is 2 months status post onset of medial right knee pain.  She did not have any particular injury but had increased her walking.  She continued to have knee pain and to get significant disability and MRI was ordered this MRI did demonstrate a medial proximal tibial metaphysis stress fracture.  She is feeling much better.  She is requesting a new brace which we will give her today.  Would like to keep her for her to follow-up in a month in the meantime certainly given this stress fracture and she is a higher risk for osteoporosis because she had early onset menopause at 18.  I recommended a bone density scan.  When she gets that she can follow-up in my osteoporosis clinic  Follow-Up Instructions: Return in about 1 month (around 04/20/2024).   Orders:  Orders Placed This Encounter  Procedures   XR Knee 1-2 Views Right   No orders of the defined types were placed in this encounter.     Procedures: No procedures performed   Clinical Data: No additional findings.   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  She has a 45-month history of right medial knee pain thought to be after she did a lot of walking.  MRI was ordered she is here to review.  She was told that she had a stress fracture of the proximal medial tibia she has been limiting her weightbearing.  She does admit she  is feeling much better today but still has some pain  Review of Systems  All other systems reviewed and are negative.    Objective: Vital Signs: LMP  (LMP Unknown)   Physical Exam Constitutional:      Appearance: Normal appearance.  Pulmonary:     Effort: Pulmonary effort is normal.  Skin:    General: Skin is warm and dry.  Neurological:     General: No focal deficit present.     Mental Status: She is alert and oriented to person, place, and time.     Ortho Exam Examination of her knee she has no erythema no effusion mild to moderately tender to deep palpation but certainly much improved from previous exam compartments are soft and nontender she is neurovascular intact Specialty Comments:  No specialty comments available.  Imaging: XR Knee 1-2 Views Right Result Date: 03/21/2024 Radiographs of the right knee were obtained today she has good calcium bone at the area of the diagnosed MRI fracture.  Well-maintained alignment no depression.    PMFS History: Patient Active Problem List   Diagnosis Date Noted   Acute pain of both knees 02/06/2024   History of anemia 02/06/2024   Routine general medical examination at a health care facility 11/29/2023  Nephrolithiasis 04/12/2023   Prediabetes 04/02/2023   Acute nonintractable headache 08/07/2022   Dysphagia 02/11/2022   Carpal tunnel syndrome of right wrist 02/11/2022   Primary hypertension 11/20/2021   Elevated LFTs 11/02/2021   Swelling of right foot 11/02/2021   Abnormal uterine bleeding 09/28/2021   Vitamin D  deficiency 08/17/2021   Vitamin B 12 deficiency 08/17/2021   Hot flashes 08/17/2021   Obesity (BMI 30-39.9) 08/17/2021   H. 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  Colposcopy showed inflammatory changes.  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  Recommend follow-up Pap smear 1 year   Peptic ulcer    Upper GI bleed 10/28/2017   Vaginal Pap smear, abnormal     Family History  Problem Relation Age of Onset   Stroke Sister 55   Hypertension Sister    Depression Sister    Heart attack Sister 29   Colon cancer Neg Hx    Colon polyps Neg Hx    Esophageal cancer Neg Hx    Rectal cancer Neg Hx    Stomach cancer Neg Hx    Breast cancer Neg Hx     Past Surgical History:  Procedure Laterality Date   DILATION AND CURETTAGE OF UTERUS     ESOPHAGOGASTRODUODENOSCOPY N/A 10/30/2017   Procedure: ESOPHAGOGASTRODUODENOSCOPY (EGD);  Surgeon: Nandigam, Kavitha V, MD;  Location: [facility];  Service: Endoscopy;  Laterality: N/A;   EXTRACORPOREAL SHOCK WAVE LITHOTRIPSY Right 05/07/2023   Procedure: EXTRACORPOREAL SHOCK WAVE LITHOTRIPSY (ESWL);  Surgeon: Roseann Adine PARAS., MD;  Location: [facility];  Service: Urology;  Laterality: Right;   Social History   Occupational History   Not on file  Tobacco Use   Smoking status: Never   Smokeless tobacco: Never  Vaping Use   Vaping status: Never Used  Substance and Sexual Activity   Alcohol use: No   Drug use: No   Sexual activity: Not Currently    Birth control/protection: Condom    Comment: 1st intercourse 15yo-5 partners

## 2024-03-21 NOTE — Addendum Note (Signed)
 Addended by: EILLEEN ROSINA HERO on: 03/21/2024 03:29 PM   Modules accepted: Orders

## 2024-04-22 ENCOUNTER — Encounter: Payer: Self-pay | Admitting: Physician Assistant

## 2024-04-22 ENCOUNTER — Ambulatory Visit: Admitting: Physician Assistant

## 2024-04-22 DIAGNOSIS — M84361S Stress fracture, right tibia, sequela: Secondary | ICD-10-CM | POA: Diagnosis not present

## 2024-04-22 NOTE — Progress Notes (Signed)
 Office Visit Note   Patient: Anita Holland           Date of Birth: August 02, 1965           MRN: [id number] Visit Date: 04/22/2024              Requested by: Nedra Tinnie LABOR, NP [address] [place],  [place] [address] PCP: Nedra Tinnie LABOR, NP  Chief Complaint  Patient presents with   Right Leg - Follow-up      HPI: Patient is a pleasant 58 year old woman comes in today for follow-up on her right proximal tibia stress fracture.  She is slowly getting better.  She is using a knee brace which she finds helpful.  She is inquiring about the status of her bone density scan  Assessment & Plan: Visit Diagnoses:  1. Stress fracture, right tibia, sequela     Plan: She is slowly getting better I offered her physical therapy which she declines at this time.  She does need to follow-up in my osteoporosis clinic we could recheck her leg at that time in the meantime we will make arrangements for her bone density scan to be done somewhere quicker as the original facility did not have any openings until April  Follow-Up Instructions: 1 month  Ortho Exam  Patient is alert, oriented, no adenopathy, well-dressed, normal affect, normal respiratory effort. Right knee no effusion no erythema compartments are soft and compressible she is neurovascularly intact    Imaging: No results found. No images are attached to the encounter.  Labs: Lab Results  Component Value Date   HGBA1C 5.9 11/29/2023   HGBA1C 6.0 05/31/2023   HGBA1C 5.8 06/15/2022   ESRSEDRATE 19 08/07/2022   CRP <1.0 08/07/2022     Lab Results  Component Value Date   ALBUMIN 4.7 11/29/2023   ALBUMIN 4.7 04/04/2023   ALBUMIN 4.5 07/22/2022    Lab Results  Component Value Date   MG 2.0 10/30/2017   MG 1.6 (L) 10/29/2017   MG 1.6 (L) 10/28/2017   Lab Results  Component Value Date   VD25OH 25.52 (L) 11/29/2023   VD25OH 32.08 06/15/2022   VD25OH 43.29 08/17/2021    No results found for: PREALBUMIN     Latest Ref Rng & Units 02/06/2024    4:31 PM 11/29/2023    3:29 PM 04/04/2023    7:45 PM  CBC EXTENDED  WBC 4.0 - 10.5 K/uL 3.6  5.8  6.3   RBC 3.87 - 5.11 Mil/uL 5.10  5.14  5.28   Hemoglobin 12.0 - 15.0 g/dL 85.1  85.2  84.4   HCT 36.0 - 46.0 % 45.3  45.0  48.4   Platelets 150.0 - 400.0 K/uL 292.0  363.0  327   NEUT# 1.4 - 7.7 K/uL 1.6  3.3    Lymph# 0.7 - 4.0 K/uL 1.5  1.6       There is no height or weight on file to calculate BMI.  Orders:  No orders of the defined types were placed in this encounter.  No orders of the defined types were placed in this encounter.    Procedures: No procedures performed  Clinical Data: No additional findings.  ROS:  All other systems negative, except as noted in the HPI. Review of Systems  Objective: Vital Signs: LMP  (LMP Unknown)   Specialty Comments:  No specialty comments available.  PMFS History: Patient Active Problem List   Diagnosis Date Noted   Stress fracture, right tibia, sequela  04/22/2024   Acute pain of both knees 02/06/2024   History of anemia 02/06/2024   Routine general medical examination at a health care facility 11/29/2023   Nephrolithiasis 04/12/2023   Prediabetes 04/02/2023   Acute nonintractable headache 08/07/2022   Dysphagia 02/11/2022   Carpal tunnel syndrome of right wrist 02/11/2022   Primary hypertension 11/20/2021   Elevated LFTs 11/02/2021   Swelling of right foot 11/02/2021   Abnormal uterine bleeding 09/28/2021   Vitamin D  deficiency 08/17/2021   Vitamin B 12 deficiency 08/17/2021   Hot flashes 08/17/2021   Obesity (BMI 30-39.9) 08/17/2021   H. 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  Colposcopy showed inflammatory changes.  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  Recommend follow-up Pap smear 1 year   Peptic ulcer    Upper GI bleed 10/28/2017   Vaginal Pap smear, abnormal     Family History  Problem Relation Age of Onset   Stroke Sister 16   Hypertension Sister    Depression Sister    Heart attack Sister 11   Colon cancer Neg Hx    Colon polyps Neg Hx    Esophageal cancer Neg Hx    Rectal cancer Neg Hx    Stomach cancer Neg Hx    Breast cancer Neg Hx     Past Surgical History:  Procedure Laterality Date   DILATION AND CURETTAGE OF UTERUS     ESOPHAGOGASTRODUODENOSCOPY N/A 10/30/2017   Procedure: ESOPHAGOGASTRODUODENOSCOPY (EGD);  Surgeon: Nandigam, Kavitha V, MD;  Location: [facility];  Service: Endoscopy;  Laterality: N/A;   EXTRACORPOREAL SHOCK WAVE LITHOTRIPSY Right 05/07/2023   Procedure: EXTRACORPOREAL SHOCK WAVE LITHOTRIPSY (ESWL);  Surgeon: Roseann Adine PARAS., MD;  Location: [facility];  Service: Urology;  Laterality: Right;   Social History   Occupational History   Not on file  Tobacco Use   Smoking status: Never   Smokeless tobacco: Never  Vaping Use   Vaping status: Never Used  Substance and Sexual Activity   Alcohol use: No   Drug use: No   Sexual activity: Not Currently    Birth control/protection: Condom    Comment: 1st intercourse 15yo-5 partners

## 2024-04-28 ENCOUNTER — Encounter: Payer: Self-pay | Admitting: Radiology

## 2024-07-09 ENCOUNTER — Ambulatory Visit: Payer: Self-pay

## 2024-07-09 DIAGNOSIS — M84361D Stress fracture, right tibia, subsequent encounter for fracture with routine healing: Secondary | ICD-10-CM
